# Patient Record
Sex: Female | Born: 1940 | Race: White | Hispanic: No | Marital: Married | Smoking: Never smoker
Health system: Southern US, Community
[De-identification: ages and names within clinical notes are randomized; demographics above are authoritative.]

## PROBLEM LIST (undated history)

## (undated) DIAGNOSIS — M1990 Unspecified osteoarthritis, unspecified site: Secondary | ICD-10-CM

## (undated) DIAGNOSIS — F0390 Unspecified dementia without behavioral disturbance: Secondary | ICD-10-CM

## (undated) DIAGNOSIS — Z9289 Personal history of other medical treatment: Secondary | ICD-10-CM

## (undated) DIAGNOSIS — H409 Unspecified glaucoma: Secondary | ICD-10-CM

## (undated) DIAGNOSIS — I499 Cardiac arrhythmia, unspecified: Secondary | ICD-10-CM

## (undated) DIAGNOSIS — F32A Depression, unspecified: Secondary | ICD-10-CM

## (undated) DIAGNOSIS — I4891 Unspecified atrial fibrillation: Secondary | ICD-10-CM

## (undated) DIAGNOSIS — E785 Hyperlipidemia, unspecified: Secondary | ICD-10-CM

## (undated) DIAGNOSIS — E538 Deficiency of other specified B group vitamins: Secondary | ICD-10-CM

## (undated) DIAGNOSIS — H269 Unspecified cataract: Secondary | ICD-10-CM

## (undated) HISTORY — DX: Personal history of other medical treatment: Z92.89

## (undated) HISTORY — PX: HEMORROIDECTOMY: SUR656

## (undated) HISTORY — DX: Hyperlipidemia, unspecified: E78.5

## (undated) HISTORY — PX: EYE SURGERY: SHX253

## (undated) HISTORY — DX: Depression, unspecified: F32.A

## (undated) HISTORY — PX: APPENDECTOMY: SHX54

## (undated) HISTORY — DX: Unspecified atrial fibrillation: I48.91

## (undated) HISTORY — DX: Unspecified cataract: H26.9

## (undated) HISTORY — PX: TONSILLECTOMY: SUR1361

## (undated) HISTORY — DX: Unspecified dementia, unspecified severity, without behavioral disturbance, psychotic disturbance, mood disturbance, and anxiety: F03.90

## (undated) HISTORY — DX: Unspecified osteoarthritis, unspecified site: M19.90

## (undated) HISTORY — DX: Deficiency of other specified B group vitamins: E53.8

---

## 1995-12-29 LAB — HM COLONOSCOPY

## 1999-02-26 ENCOUNTER — Other Ambulatory Visit: Admission: RE | Admit: 1999-02-26 | Discharge: 1999-02-26 | Payer: Self-pay | Admitting: Obstetrics and Gynecology

## 2000-07-15 ENCOUNTER — Encounter: Payer: Self-pay | Admitting: Obstetrics and Gynecology

## 2000-07-15 ENCOUNTER — Encounter: Admission: RE | Admit: 2000-07-15 | Discharge: 2000-07-15 | Payer: Self-pay | Admitting: Obstetrics and Gynecology

## 2000-08-09 ENCOUNTER — Other Ambulatory Visit: Admission: RE | Admit: 2000-08-09 | Discharge: 2000-08-09 | Payer: Self-pay | Admitting: Obstetrics and Gynecology

## 2001-06-07 ENCOUNTER — Encounter: Payer: Self-pay | Admitting: Ophthalmology

## 2001-06-07 ENCOUNTER — Ambulatory Visit (HOSPITAL_COMMUNITY): Admission: RE | Admit: 2001-06-07 | Discharge: 2001-06-08 | Payer: Self-pay | Admitting: Ophthalmology

## 2001-08-15 ENCOUNTER — Other Ambulatory Visit: Admission: RE | Admit: 2001-08-15 | Discharge: 2001-08-15 | Payer: Self-pay | Admitting: Obstetrics and Gynecology

## 2002-09-05 ENCOUNTER — Other Ambulatory Visit: Admission: RE | Admit: 2002-09-05 | Discharge: 2002-09-05 | Payer: Self-pay | Admitting: Obstetrics and Gynecology

## 2003-10-17 ENCOUNTER — Other Ambulatory Visit: Admission: RE | Admit: 2003-10-17 | Discharge: 2003-10-17 | Payer: Self-pay | Admitting: Obstetrics and Gynecology

## 2003-11-01 ENCOUNTER — Encounter: Admission: RE | Admit: 2003-11-01 | Discharge: 2003-11-01 | Payer: Self-pay | Admitting: Obstetrics and Gynecology

## 2004-12-02 ENCOUNTER — Ambulatory Visit (HOSPITAL_COMMUNITY): Admission: RE | Admit: 2004-12-02 | Discharge: 2004-12-02 | Payer: Self-pay | Admitting: Obstetrics and Gynecology

## 2005-09-14 ENCOUNTER — Observation Stay (HOSPITAL_COMMUNITY): Admission: RE | Admit: 2005-09-14 | Discharge: 2005-09-15 | Payer: Self-pay | Admitting: General Surgery

## 2013-02-14 ENCOUNTER — Emergency Department (INDEPENDENT_AMBULATORY_CARE_PROVIDER_SITE_OTHER)
Admission: EM | Admit: 2013-02-14 | Discharge: 2013-02-14 | Disposition: A | Discharge status: Home / Self Care | Payer: Medicare Other | Source: Home / Self Care

## 2013-02-14 ENCOUNTER — Encounter (HOSPITAL_COMMUNITY): Payer: Self-pay | Admitting: *Deleted

## 2013-02-14 DIAGNOSIS — L03039 Cellulitis of unspecified toe: Secondary | ICD-10-CM

## 2013-02-14 DIAGNOSIS — L03031 Cellulitis of right toe: Secondary | ICD-10-CM

## 2013-02-14 DIAGNOSIS — L03032 Cellulitis of left toe: Secondary | ICD-10-CM

## 2013-02-14 DIAGNOSIS — L0390 Cellulitis, unspecified: Secondary | ICD-10-CM

## 2013-02-14 DIAGNOSIS — L0291 Cutaneous abscess, unspecified: Secondary | ICD-10-CM

## 2013-02-14 HISTORY — DX: Unspecified glaucoma: H40.9

## 2013-02-14 MED ORDER — DOXYCYCLINE HYCLATE 100 MG PO CAPS: 100.0000 mg | ORAL_CAPSULE | Freq: Two times a day (BID) | ORAL | Status: DC

## 2013-02-14 NOTE — ED Notes (Signed)
Pt reports toe infection on 4th toe on right foot and 3rd toe on left foot - toes are discolored, draining, sore to touch - no known injury - reports that she did cut toe nails prior to infection- has tried soaking feet and neosporin with no relief - pt is not diabetic has no ongoing health problems

## 2013-02-14 NOTE — ED Provider Notes (Signed)
Medical screening examination/treatment/procedure(s) were performed by resident physician or non-physician practitioner and as supervising physician I was immediately available for consultation/collaboration.   Barkley Bruns MD.   Linna Hoff, MD 02/14/13 [phone]

## 2013-02-14 NOTE — ED Provider Notes (Signed)
History     CSN: [id number]  Arrival date & time 02/14/13  1109   First MD Initiated Contact with Patient 02/14/13 1117      Chief Complaint  Patient presents with  . Toe Pain    (Consider location/radiation/quality/duration/timing/severity/associated sxs/prior treatment) HPI Comments: 72 year old well-preserved and active female is accompanied by her husband with a chief complaint of redness and tenderness in her toes of both feet. Approximately 2 weeks ago she gave herself a pedicure and used unclean utensils to clean under her cuticles and her nails. She is complaining of soreness in the toes primarily the left third toe in the right second third and fourth toes. She has been soaking them in warm water and apply Neosporin. They would slightly better and then worse again. She denies constitutional symptoms. No fever.   Past Medical History  Diagnosis Date  . Glaucoma     Past Surgical History  Procedure Laterality Date  . Appendectomy      Family History  Problem Relation Age of Onset  . Family history unknown: Yes    History  Substance Use Topics  . Smoking status: Never Smoker   . Smokeless tobacco: Not on file  . Alcohol Use: Yes     Comment: occasional    OB History   Grav Para Term Preterm Abortions TAB SAB Ect Mult Living                  Review of Systems  Constitutional: Negative.   Respiratory: Negative.   Gastrointestinal: Negative.   Skin: Positive for color change.       See history of present illness  Neurological: Negative.     Allergies  Review of patient's allergies indicates no known allergies.  Home Medications  No current outpatient prescriptions on file.  BP 132/82  Pulse 79  Temp(Src) 98.3 F (36.8 C) (Oral)  Resp 20  SpO2 95%  Physical Exam  Constitutional: She is oriented to person, place, and time. She appears well-developed and well-nourished. No distress.  Eyes: Conjunctivae and EOM are normal.  Neck: Normal range of  motion. Neck supple.  Pulmonary/Chest: Effort normal.  Neurological: She is alert and oriented to person, place, and time. She exhibits normal muscle tone.  Skin: Skin is warm and dry. No rash noted. There is erythema.  Minor erythema and swelling of the right second third and fourth toes in the left third toe. No abscess formation or purulence of the paronychia. There is no erythema over a joint space. Distal neurovascular and sensory is intact. She is able to wiggle her toes however there is some decrease in range of motion due to pain.  Psychiatric: She has a normal mood and affect.    ED Course  Procedures (including critical care time)  Labs Reviewed - No data to display No results found.   1. Paronychia of second toe, right   2. Paronychia of third toe, left   3. Cellulitis       MDM  There is non-circumferential swelling and mild erythema to the distal aspect of the right second third and fourth toe and the left third toe. Although there are 3 toes with paronychia none of which have evidence of underlying purulence. Doxycycline 100 mg twice a day for 10 days. So toes in warm salty water 2-3 times a day. If there is no resolution or improvement within 3 days or if getting worse we will need to return.  Hayden Rasmussen, NP 02/14/13 1415

## 2013-03-26 ENCOUNTER — Emergency Department (INDEPENDENT_AMBULATORY_CARE_PROVIDER_SITE_OTHER)
Admission: EM | Admit: 2013-03-26 | Discharge: 2013-03-26 | Disposition: A | Discharge status: Home / Self Care | Payer: Medicare Other | Source: Home / Self Care | Attending: Family Medicine | Admitting: Family Medicine

## 2013-03-26 ENCOUNTER — Emergency Department (INDEPENDENT_AMBULATORY_CARE_PROVIDER_SITE_OTHER): Payer: Medicare Other

## 2013-03-26 ENCOUNTER — Encounter (HOSPITAL_COMMUNITY): Payer: Self-pay | Admitting: *Deleted

## 2013-03-26 DIAGNOSIS — M79674 Pain in right toe(s): Secondary | ICD-10-CM

## 2013-03-26 DIAGNOSIS — M79609 Pain in unspecified limb: Secondary | ICD-10-CM

## 2013-03-26 MED ORDER — DOXYCYCLINE HYCLATE 100 MG PO CAPS
100.0000 mg | ORAL_CAPSULE | Freq: Two times a day (BID) | ORAL | Status: DC
Start: 2013-03-26 — End: 2015-05-20

## 2013-03-26 NOTE — ED Notes (Signed)
Patient compalins of toe pain right toe pain pain started yesterday. Right 4th and 5th toes are painful and turning purple.

## 2013-03-26 NOTE — ED Provider Notes (Signed)
History     CSN: [id number]  Arrival date & time 03/26/13  1109   First MD Initiated Contact with Patient 03/26/13 1111      Chief Complaint  Patient presents with  . Toe Pain    HPI: Patient is a 72 y.o. female presenting with toe pain. The history is provided by the patient.  Toe Pain This is a recurrent problem. The current episode started 12 to 24 hours ago. The problem occurs constantly. The problem has been gradually worsening. The symptoms are aggravated by walking. Nothing relieves the symptoms. She has tried nothing for the symptoms.  Pt reports onset of pain and TTP to her (R) 4th and 5th toes. States she was here 02/14/2013 for same type of infection in other toes and was treated with antibiotic. Symptoms completely resolved. Last time pt states the infection in her toes started the same way and she waited longer and the toes were much worse by the time she was seen. States she wants to get treated earlier before infection gets worse. Denies injury or fever.   Past Medical History  Diagnosis Date  . Glaucoma     Past Surgical History  Procedure Laterality Date  . Appendectomy      No family history on file.  History  Substance Use Topics  . Smoking status: Never Smoker   . Smokeless tobacco: Not on file  . Alcohol Use: Yes     Comment: occasional    OB History   Grav Para Term Preterm Abortions TAB SAB Ect Mult Living                  Review of Systems  All other systems reviewed and are negative.    Allergies  Review of patient's allergies indicates no known allergies.  Home Medications   Current Outpatient Rx  Name  Route  Sig  Dispense  Refill  . doxycycline (VIBRAMYCIN) 100 MG capsule   Oral   Take 1 capsule (100 mg total) by mouth 2 (two) times daily.   20 capsule   0     BP 137/74  Pulse 80  Temp(Src) 98.1 F (36.7 C) (Oral)  Resp 18  SpO2 100%  Physical Exam  Constitutional: She is oriented to person, place, and time. She  appears well-developed and well-nourished.  HENT:  Head: Normocephalic and atraumatic.  Eyes: Conjunctivae are normal.  Neck: Neck supple.  Cardiovascular: Normal rate.   Pulmonary/Chest: Effort normal.  Musculoskeletal: Normal range of motion.       Feet:  (R) 4th and 5th toes noted w/ noncircumfrential swelling and somewhat bluish in appearance. Noted old healed wound to 4th (R) toe from previous infection. No open wound currently. Cap refill diminished but good palpable DP pulses. Both toes very TTP.   Neurological: She is alert and oriented to person, place, and time.  Skin: Skin is warm and dry.  Psychiatric: She has a normal mood and affect.    ED Course  Procedures (including critical care time)  Labs Reviewed - No data to display Dg Foot Complete Right  03/26/2013  *RADIOLOGY REPORT*  Clinical Data: Chronic right fourth toe infection with associated swelling.  RIGHT FOOT COMPLETE - 3+ VIEW  Comparison: None.  Findings: Diffuse soft tissue swelling involving the distal aspect of the fourth toe.  The underlying bones have normal appearances with no periosteal reaction or bone destruction.  There is minimal fragmented degenerative spur formation at the fourth DIP joint.  No  soft tissue gas.  The remainder of the examination is unremarkable.  IMPRESSION: Fourth toe soft tissue swelling without underlying fracture or evidence of osteomyelitis.  There are minimal degenerative changes at the fourth DIP joint.   Original Report Authenticated By: Beckie Salts, M.D.      No diagnosis found.    MDM  Recurrent toe infections. Current pain is to (R) 4th and 5th toes (Seen previously here on 02/14/2013 for same symptoms) Will repeat treatment with Doxycycline as this did resolve symptoms last time. Xrays negative for osteomyelitis. Pt dweclined med for pain. Will provide referrals to [facility] for f/u if this continues to be recurrent. Discussed witgh Dr Artis Flock who has also seen pt and xrays  and is agreeable w/ plan.         Leanne Chang, NP 03/26/13 1239

## 2013-03-30 NOTE — ED Provider Notes (Signed)
Medical screening examination/treatment/procedure(s) were performed by resident physician or non-physician practitioner and as supervising physician I was immediately available for consultation/collaboration.   Barkley Bruns MD.   Linna Hoff, MD 03/30/13 [phone]

## 2014-12-28 LAB — COLOGUARD: Cologuard: NEGATIVE

## 2014-12-28 LAB — HM MAMMOGRAPHY

## 2015-05-01 ENCOUNTER — Encounter (HOSPITAL_COMMUNITY): Payer: Self-pay | Admitting: Emergency Medicine

## 2015-05-01 ENCOUNTER — Emergency Department (HOSPITAL_COMMUNITY)
Admission: EM | Admit: 2015-05-01 | Discharge: 2015-05-01 | Disposition: A | Discharge status: Home / Self Care | Payer: Medicare PPO | Attending: Emergency Medicine | Admitting: Emergency Medicine

## 2015-05-01 DIAGNOSIS — Z79899 Other long term (current) drug therapy: Secondary | ICD-10-CM | POA: Insufficient documentation

## 2015-05-01 DIAGNOSIS — H409 Unspecified glaucoma: Secondary | ICD-10-CM | POA: Diagnosis not present

## 2015-05-01 DIAGNOSIS — I4891 Unspecified atrial fibrillation: Secondary | ICD-10-CM

## 2015-05-01 DIAGNOSIS — Z7982 Long term (current) use of aspirin: Secondary | ICD-10-CM | POA: Insufficient documentation

## 2015-05-01 DIAGNOSIS — R531 Weakness: Secondary | ICD-10-CM | POA: Diagnosis present

## 2015-05-01 LAB — BASIC METABOLIC PANEL
Anion gap: 8 (ref 5–15)
BUN: 17 mg/dL (ref 6–20)
CO2: 27 mmol/L (ref 22–32)
Calcium: 9.3 mg/dL (ref 8.9–10.3)
Chloride: 105 mmol/L (ref 101–111)
Creatinine, Ser: 0.82 mg/dL (ref 0.44–1.00)
GFR calc Af Amer: >60 mL/min (ref >60)
GFR calc non Af Amer: >60 mL/min (ref >60)
Glucose, Bld: 106 mg/dL — ABNORMAL HIGH (ref 70–99)
Potassium: 4.1 mmol/L (ref 3.5–5.1)
Sodium: 140 mmol/L (ref 135–145)

## 2015-05-01 LAB — CBC
HCT: 45.7 % (ref 36.0–46.0)
Hemoglobin: 15 g/dL (ref 12.0–15.0)
MCH: 30.5 pg (ref 26.0–34.0)
MCHC: 32.8 g/dL (ref 30.0–36.0)
MCV: 93.1 fL (ref 78.0–100.0)
Platelets: 186 10*3/uL (ref 150–400)
RBC: 4.91 MIL/uL (ref 3.87–5.11)
RDW: 13.2 % (ref 11.5–15.5)
WBC: 4.6 10*3/uL (ref 4.0–10.5)

## 2015-05-01 LAB — MAGNESIUM: Magnesium: 2.4 mg/dL (ref 1.7–2.4)

## 2015-05-01 LAB — URINALYSIS, ROUTINE W REFLEX MICROSCOPIC
Bilirubin Urine: NEGATIVE
Glucose, UA: NEGATIVE mg/dL
Ketones, ur: NEGATIVE mg/dL
Nitrite: NEGATIVE
Protein, ur: NEGATIVE mg/dL
Specific Gravity, Urine: 1.015 (ref 1.005–1.030)
Urobilinogen, UA: 0.2 mg/dL (ref 0.0–1.0)
pH: 5.5 (ref 5.0–8.0)

## 2015-05-01 LAB — URINE MICROSCOPIC-ADD ON

## 2015-05-01 LAB — TROPONIN I: Troponin I: <0.03 ng/mL (ref <0.031)

## 2015-05-01 LAB — TSH: TSH: 2.915 u[IU]/mL (ref 0.350–4.500)

## 2015-05-01 MED ORDER — DILTIAZEM HCL ER COATED BEADS 120 MG PO CP24
ORAL_CAPSULE | ORAL | Status: AC
  Filled 2015-05-01: qty 1

## 2015-05-01 MED ORDER — APIXABAN 5 MG PO TABS: 5.0000 mg | ORAL_TABLET | Freq: Two times a day (BID) | ORAL | Status: DC

## 2015-05-01 MED ORDER — APIXABAN 5 MG PO TABS
ORAL_TABLET | ORAL | Status: AC
  Filled 2015-05-01: qty 1

## 2015-05-01 MED ORDER — DILTIAZEM LOAD VIA INFUSION
10.0000 mg | Freq: Once | INTRAVENOUS | Status: AC
  Administered 2015-05-01: 10 mg via INTRAVENOUS
  Filled 2015-05-01: qty 10

## 2015-05-01 MED ORDER — DILTIAZEM HCL ER COATED BEADS 120 MG PO CP24
120.0000 mg | ORAL_CAPSULE | Freq: Every day | ORAL | Status: DC
  Administered 2015-05-01: 120 mg via ORAL
  Filled 2015-05-01 (×3): qty 1

## 2015-05-01 MED ORDER — ASPIRIN 81 MG PO CHEW
324.0000 mg | CHEWABLE_TABLET | Freq: Once | ORAL | Status: AC
  Administered 2015-05-01: 324 mg via ORAL
  Filled 2015-05-01: qty 4

## 2015-05-01 MED ORDER — DILTIAZEM HCL 100 MG IV SOLR
5.0000 mg/h | INTRAVENOUS | Status: DC
  Administered 2015-05-01: 5 mg/h via INTRAVENOUS
  Filled 2015-05-01: qty 100

## 2015-05-01 MED ORDER — APIXABAN 5 MG PO TABS
5.0000 mg | ORAL_TABLET | Freq: Two times a day (BID) | ORAL | Status: DC
  Administered 2015-05-01: 5 mg via ORAL
  Filled 2015-05-01 (×4): qty 1

## 2015-05-01 MED ORDER — DILTIAZEM HCL ER COATED BEADS 120 MG PO CP24: 120.0000 mg | ORAL_CAPSULE | Freq: Every day | ORAL | Status: DC

## 2015-05-01 NOTE — ED Notes (Signed)
Patient with no complaints at this time. Respirations even and unlabored. Skin warm/dry. Discharge instructions reviewed with patient at this time. Patient given opportunity to voice concerns/ask questions. IV removed per policy and band-aid applied to site. Patient discharged at this time and left Emergency Department with steady gait.  

## 2015-05-01 NOTE — ED Notes (Signed)
Reviewed d/c instructions w/patient and husband.  She voices understanding of these.

## 2015-05-01 NOTE — Discharge Instructions (Signed)

## 2015-05-01 NOTE — ED Notes (Signed)
Pt reports fell and hit her head several months ago. Pt reports for the last several weeks has become progressively weaker,unsteady walking,sob. nad noted.

## 2015-05-01 NOTE — ED Provider Notes (Signed)
CSN: [id number]     Arrival date & time 05/01/15  1534 History   First MD Initiated Contact with Patient 05/01/15 1742     Chief Complaint  Patient presents with  . Weakness     (Consider location/radiation/quality/duration/timing/severity/associated sxs/prior Treatment) HPI   74yF with generalized weakness and palpitations. Noted to be in new onset afib. Symptom onset a couple weeks ago. Persistent. No appreciable exacerbating or relieving factors. Denies CP. No SOB. No fever or chills. No n/v. No urinary complaints. Denies hx of thyroid disease or CAD. Denies significant ETOH.   Past Medical History  Diagnosis Date  . Glaucoma    Past Surgical History  Procedure Laterality Date  . Appendectomy     History reviewed. No pertinent family history. History  Substance Use Topics  . Smoking status: Never Smoker   . Smokeless tobacco: Not on file  . Alcohol Use: Yes     Comment: occasional   OB History    No data available     Review of Systems  All systems reviewed and negative, other than as noted in HPI.   Allergies  Review of patient's allergies indicates no known allergies.  Home Medications   Prior to Admission medications   Medication Sig Start Date End Date Taking? Authorizing Provider  aspirin EC 81 MG tablet Take 81 mg by mouth daily.   Yes Historical Provider, MD  latanoprost (XALATAN) 0.005 % ophthalmic solution Place 1 drop into the left eye at bedtime. 02/26/15  Yes Historical Provider, MD  timolol (TIMOPTIC) 0.5 % ophthalmic solution Place 1 drop into the left eye 2 (two) times daily.   Yes Historical Provider, MD  doxycycline (VIBRAMYCIN) 100 MG capsule Take 1 capsule (100 mg total) by mouth 2 (two) times daily. Patient not taking: Reported on 05/01/2015 02/14/13   Janne Napoleon, NP  doxycycline (VIBRAMYCIN) 100 MG capsule Take 1 capsule (100 mg total) by mouth 2 (two) times daily. Patient not taking: Reported on 05/01/2015 03/26/13   Rhetta Mura Schorr, NP   BP  121/85 mmHg  Pulse 98  Temp(Src) 97.9 F (36.6 C) (Oral)  Resp 18  Ht 5\' 6"  (1.676 m)  Wt 115 lb (52.164 kg)  BMI 18.57 kg/m2  SpO2 97% Physical Exam  Constitutional: She appears well-developed and well-nourished. No distress.  HENT:  Head: Normocephalic and atraumatic.  Eyes: Conjunctivae are normal. Right eye exhibits no discharge. Left eye exhibits no discharge.  Neck: Neck supple.  Cardiovascular: Normal rate and normal heart sounds.  Exam reveals no gallop and no friction rub.   No murmur heard. irreg irreg  Pulmonary/Chest: Effort normal and breath sounds normal. No respiratory distress.  Abdominal: Soft. She exhibits no distension. There is no tenderness.  Musculoskeletal: She exhibits no edema or tenderness.  Lower extremities symmetric as compared to each other. No calf tenderness. Negative Homan's. No palpable cords.   Neurological: She is alert.  Skin: Skin is warm and dry.  Psychiatric: She has a normal mood and affect. Her behavior is normal. Thought content normal.  Nursing note and vitals reviewed.   ED Course  Procedures (including critical care time) Labs Review Labs Reviewed  BASIC METABOLIC PANEL - Abnormal; Notable for the following:    Glucose, Bld 106 (*)    All other components within normal limits  CBC    Imaging Review No results found.   EKG Interpretation   Date/Time:  Wednesday May 01 2015 18:57:21 EDT Ventricular Rate:  146 PR Interval:  QRS Duration: 85 QT Interval:  285 QTC Calculation: 444 R Axis:   76 Text Interpretation:  Atrial fibrillation with rapid V-rate Repolarization  abnormality, prob rate related ED PHYSICIAN INTERPRETATION AVAILABLE IN  [facility] Confirmed by TEST, Record ([id number]) on 05/03/2015 7:21:01 AM      MDM   Final diagnoses:  New onset a-fib    74yF with new onset atrial fibrillation. Suspect the etiology of her symptoms. CHA2DS2-VASc score is 3 with female sex and age of 37. Rate now controlled.  Will provide prescription for cardizem and touch base with cardiology with regards to recommendations for anticoagulant.       Virgel Manifold, MD 05/08/15 [phone]

## 2015-05-20 ENCOUNTER — Encounter: Payer: Self-pay | Admitting: Cardiovascular Disease

## 2015-05-20 ENCOUNTER — Ambulatory Visit (INDEPENDENT_AMBULATORY_CARE_PROVIDER_SITE_OTHER): Payer: Medicare PPO | Admitting: Cardiovascular Disease

## 2015-05-20 VITALS — BP 120/76 | HR 74 | Ht 65.0 in | Wt 113.0 lb

## 2015-05-20 DIAGNOSIS — Z87898 Personal history of other specified conditions: Secondary | ICD-10-CM | POA: Diagnosis not present

## 2015-05-20 DIAGNOSIS — Z7189 Other specified counseling: Secondary | ICD-10-CM

## 2015-05-20 DIAGNOSIS — R002 Palpitations: Secondary | ICD-10-CM

## 2015-05-20 DIAGNOSIS — I4891 Unspecified atrial fibrillation: Secondary | ICD-10-CM | POA: Diagnosis not present

## 2015-05-20 DIAGNOSIS — R5383 Other fatigue: Secondary | ICD-10-CM

## 2015-05-20 DIAGNOSIS — Z9289 Personal history of other medical treatment: Secondary | ICD-10-CM

## 2015-05-20 MED ORDER — DILTIAZEM HCL ER COATED BEADS 240 MG PO CP24: 240.0000 mg | ORAL_CAPSULE | Freq: Every day | ORAL | Status: DC

## 2015-05-20 NOTE — Patient Instructions (Addendum)
Your physician recommends that you schedule a follow-up appointment in:  2 weeks   Your physician has recommended you make the following change in your medication:   INCREASE Cardizem to 240 mg daily   Your physician has requested that you have an echocardiogram. Echocardiography is a painless test that uses sound waves to create images of your heart. It provides your doctor with information about the size and shape of your heart and how well your heart's chambers and valves are working. This procedure takes approximately one hour. There are no restrictions for this procedure.     Thank you for choosing [facility] !

## 2015-05-20 NOTE — Progress Notes (Signed)
Patient ID: Kelly Dougherty, female   DOB: [birth year], 74 y.o.   MRN: [id number]       CARDIOLOGY CONSULT NOTE  Patient ID: Kelly Dougherty MRN: [id number] DOB/AGE: 10/13/1941 74 y.o.  Admit date: (Not on file) Primary Physician Kelly Seashore, MD  Reason for Consultation: New onset atrial fibrillation  HPI: The patient is a 74 year old woman was recently evaluated in the emergency department on 05/01/15 for weakness and palpitations, and was found to be in new onset rapid atrial fibrillation. She was prescribed long-acting diltiazem for rate control and apixaban for anticoagulation. I reviewed all relevant labs and studies. Magnesium, TSH, troponin, CBC, and basic metabolic panel were all unremarkable. ECG demonstrated rapid atrial fibrillation and flutter, heart rate 146 bpm.  She is here with her husband. She tells me that prior to her presentation to the ED, she had been experiencing one week of palpitations accompanied by fatigue and shortness of breath. She also had lightheadedness and dizziness but denied syncope. Other complaints relate to a lack of sensation in the bottom part of the middle toe of both feet. She denies significant leg swelling. She has felt better on the diltiazem but continues to have some fatigue and shortness of breath.   No Known Allergies  Current Outpatient Prescriptions  Medication Sig Dispense Refill  . apixaban (ELIQUIS) 5 MG TABS tablet Take 1 tablet (5 mg total) by mouth 2 (two) times daily. 60 tablet 0  . diltiazem (CARDIZEM CD) 120 MG 24 hr capsule Take 1 capsule (120 mg total) by mouth daily. 30 capsule 0  . latanoprost (XALATAN) 0.005 % ophthalmic solution Place 1 drop into the left eye at bedtime.    . timolol (TIMOPTIC) 0.5 % ophthalmic solution Place 1 drop into the left eye 2 (two) times daily.     No current facility-administered medications for this visit.    Past Medical History  Diagnosis Date  . Glaucoma     Past Surgical History   Procedure Laterality Date  . Appendectomy      History   Social History  . Marital Status: Married    Spouse Name: N/A  . Number of Children: N/A  . Years of Education: N/A   Occupational History  . Not on file.   Social History Main Topics  . Smoking status: Never Smoker   . Smokeless tobacco: Not on file  . Alcohol Use: Yes     Comment: occasional  . Drug Use: No  . Sexual Activity: Not on file   Other Topics Concern  . Not on file   Social History Narrative     No family history of premature CAD in 1st degree relatives.  Prior to Admission medications   Medication Sig Start Date End Date Taking? Authorizing Provider  apixaban (ELIQUIS) 5 MG TABS tablet Take 1 tablet (5 mg total) by mouth 2 (two) times daily. 05/01/15  Yes Kelly Manifold, MD  diltiazem (CARDIZEM CD) 120 MG 24 hr capsule Take 1 capsule (120 mg total) by mouth daily. 05/01/15  Yes Kelly Manifold, MD  latanoprost (XALATAN) 0.005 % ophthalmic solution Place 1 drop into the left eye at bedtime. 02/26/15  Yes Historical Provider, MD  timolol (TIMOPTIC) 0.5 % ophthalmic solution Place 1 drop into the left eye 2 (two) times daily.   Yes Historical Provider, MD     Review of systems complete and found to be negative unless listed above in HPI     Physical exam Blood pressure 120/76, pulse 74,  height 5\' 5"  (1.651 m), weight 113 lb (51.256 kg), SpO2 98 %.   HR by auscultation: 96 bpm  General: NAD Neck: No JVD, no thyromegaly or thyroid nodule.  Lungs: Clear to auscultation bilaterally with normal respiratory effort. CV: Nondisplaced PMI. Heart rate at upper normal limits, irregular rhythm, normal S1/S2, no S3, no murmur.  No peripheral edema.  No carotid bruit.  Normal pedal pulses.  Abdomen: Soft, nontender, no hepatosplenomegaly, no distention.  Skin: Intact without lesions or rashes.  Neurologic: Alert and oriented x 3.  Psych: Normal affect. Extremities: No clubbing or cyanosis. Feet are cold. HEENT:  Normal.   ECG: Most recent ECG reviewed.  Labs:   Lab Results  Component Value Date   WBC 4.6 05/01/2015   HGB 15.0 05/01/2015   HCT 45.7 05/01/2015   MCV 93.1 05/01/2015   PLT 186 05/01/2015   No results for input(s): NA, K, CL, CO2, BUN, CREATININE, CALCIUM, PROT, BILITOT, ALKPHOS, ALT, AST, GLUCOSE in the last 168 hours.  Invalid input(s): LABALBU Lab Results  Component Value Date   TROPONINI <0.03 05/01/2015   No results found for: CHOL No results found for: HDL No results found for: LDLCALC No results found for: TRIG No results found for: CHOLHDL No results found for: LDLDIRECT       Studies: No results found.  ASSESSMENT AND PLAN:  1. New onset atrial fibrillation: Symptomatically improved but continues to have weakness and shortness of breath with suboptimal HR control. Will increase long-acting diltiazem to 240 mg daily. Obtain echocardiogram to assess LV function and LA volume index.  If heart rate becomes controlled but she continues to experience activity limiting symptoms, I will proceed with direct-current cardioversion. Continue apixaban for thromboembolic risk reduction given CHADSVASC score of 3 (age, gender).  Dispo: f/u 2 weeks.   Signed: Kate Sable, M.D., F.A.C.C.  05/20/2015, 8:26 AM

## 2015-05-21 ENCOUNTER — Ambulatory Visit (HOSPITAL_COMMUNITY)
Admission: RE | Admit: 2015-05-21 | Discharge: 2015-05-21 | Disposition: A | Discharge status: Home / Self Care | Payer: Medicare PPO | Source: Ambulatory Visit | Attending: Cardiovascular Disease | Admitting: Cardiovascular Disease

## 2015-05-21 DIAGNOSIS — I083 Combined rheumatic disorders of mitral, aortic and tricuspid valves: Secondary | ICD-10-CM | POA: Insufficient documentation

## 2015-05-21 DIAGNOSIS — I4891 Unspecified atrial fibrillation: Secondary | ICD-10-CM | POA: Diagnosis present

## 2015-05-28 ENCOUNTER — Telehealth: Payer: Self-pay | Admitting: Cardiovascular Disease

## 2015-05-28 ENCOUNTER — Other Ambulatory Visit: Payer: Self-pay

## 2015-05-28 MED ORDER — APIXABAN 5 MG PO TABS: 5.0000 mg | ORAL_TABLET | Freq: Two times a day (BID) | ORAL | Status: DC

## 2015-05-28 NOTE — Telephone Encounter (Signed)
Needs refill on Eliquis 5 mg sent to [facility] on [address] / tg

## 2015-05-28 NOTE — Telephone Encounter (Signed)
Refill complete 

## 2015-05-29 ENCOUNTER — Other Ambulatory Visit: Payer: Self-pay

## 2015-05-29 MED ORDER — APIXABAN 5 MG PO TABS: 5.0000 mg | ORAL_TABLET | Freq: Two times a day (BID) | ORAL | Status: DC

## 2015-05-29 NOTE — Telephone Encounter (Signed)
Refill complete 

## 2015-06-06 ENCOUNTER — Ambulatory Visit (INDEPENDENT_AMBULATORY_CARE_PROVIDER_SITE_OTHER): Payer: Medicare PPO | Admitting: Cardiovascular Disease

## 2015-06-06 ENCOUNTER — Other Ambulatory Visit: Payer: Self-pay

## 2015-06-06 ENCOUNTER — Encounter: Payer: Self-pay | Admitting: Cardiovascular Disease

## 2015-06-06 VITALS — BP 122/68 | HR 95 | Ht 65.0 in | Wt 109.0 lb

## 2015-06-06 DIAGNOSIS — Z7189 Other specified counseling: Secondary | ICD-10-CM | POA: Diagnosis not present

## 2015-06-06 DIAGNOSIS — M79671 Pain in right foot: Secondary | ICD-10-CM

## 2015-06-06 DIAGNOSIS — I4891 Unspecified atrial fibrillation: Secondary | ICD-10-CM | POA: Diagnosis not present

## 2015-06-06 DIAGNOSIS — R5383 Other fatigue: Secondary | ICD-10-CM | POA: Diagnosis not present

## 2015-06-06 DIAGNOSIS — R002 Palpitations: Secondary | ICD-10-CM | POA: Diagnosis not present

## 2015-06-06 DIAGNOSIS — I429 Cardiomyopathy, unspecified: Secondary | ICD-10-CM

## 2015-06-06 MED ORDER — DILTIAZEM HCL ER COATED BEADS 120 MG PO CP24: ORAL_CAPSULE | ORAL | Status: DC

## 2015-06-06 NOTE — Progress Notes (Signed)
Patient ID: Kelly Dougherty, female   DOB: Nov 14, 1941, 74 y.o.   MRN: [id number]      SUBJECTIVE: The patient presents for follow-up of new onset atrial fibrillation. Echocardiogram on 05/21/15 demonstrated mildly reduced left ventricular systolic function, LVEF 29-52%, diffuse hypokinesis, mild mitral and aortic regurgitation, moderate left atrial enlargement, mildly reduced right ventricular systolic function, and moderate to severe right atrial dilatation with moderate tricuspid regurgitation.  She says "I feel so much better". She has palpitations and some exertional shortness of breath and fatigue but this has markedly improved since her last visit with an increase of diltiazem to 240 mg daily. She denies bleeding problems. She has occasional right ankle swelling and pain at the base of the toes of her right foot after she has been standing on her feet all day.  She has eliminated caffeinated coffee, chocolate, and wine as per ED physician recommendation, and asks whether she can eat a chocolate ice cream sandwich once in a while.   Soc: Married in 1959. Husband is a retired Marine scientist and studied at [employer]. 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 3 living children, 1 son committed suicide.   Review of Systems: As per "subjective", otherwise negative.  No Known Allergies  Current Outpatient Prescriptions  Medication Sig Dispense Refill  . apixaban (ELIQUIS) 5 MG TABS tablet Take 1 tablet (5 mg total) by mouth 2 (two) times daily. 60 tablet 6  . diltiazem (CARDIZEM CD) 240 MG 24 hr capsule Take 1 capsule (240 mg total) by mouth daily. 90 capsule 3  . latanoprost (XALATAN) 0.005 % ophthalmic solution Place 1 drop into the left eye at bedtime.    . timolol (TIMOPTIC) 0.5 % ophthalmic solution Place 1 drop into the left eye 2 (two) times daily.     No current facility-administered medications for this visit.    Past Medical History  Diagnosis Date  .  Glaucoma     Past Surgical History  Procedure Laterality Date  . Appendectomy      History   Social History  . Marital Status: Married    Spouse Name: N/A  . Number of Children: N/A  . Years of Education: N/A   Occupational History  . Not on file.   Social History Main Topics  . Smoking status: Never Smoker   . Smokeless tobacco: Not on file  . Alcohol Use: 0.0 oz/week    0 Standard drinks or equivalent per week     Comment: occasional  . Drug Use: No  . Sexual Activity: Not on file   Other Topics Concern  . Not on file   Social History Narrative     Filed Vitals:   06/06/15 0927  BP: 122/68  Pulse: 95  Height: 5\' 5"  (1.651 m)  Weight: 109 lb (49.442 kg)  SpO2: 99%   HR 86 bpm by auscultation  PHYSICAL EXAM General: NAD Neck: No JVD, no thyromegaly or thyroid nodule.  Lungs: Clear to auscultation bilaterally with normal respiratory effort. CV: Nondisplaced PMI. Heart rate upper normal, irregular rhythm, normal S1/S2, no S3, no murmur. No peripheral edema. No carotid bruit. Normal pedal pulses.  Abdomen: Soft, nontender, no hepatosplenomegaly, no distention.  Skin: Intact without lesions or rashes.  Neurologic: Alert and oriented x 3.  Psych: Normal affect. Extremities: No clubbing or cyanosis. Feet are cold. HEENT: Normal.   ECG: Most recent ECG reviewed.      ASSESSMENT AND PLAN: 1. New onset  atrial fibrillation: Symptomatically improved with improved but suboptimal resting HR control. Increase long-acting diltiazem to 240 mg q am and 120 mg a pm.  Continue apixaban for thromboembolic risk reduction given CHADSVASC score of 3 (age, gender). I informed her it would be ok to drink an occasional glass of wine, eat chocolate, and coffee, but just not to do so in excess.  2. Cardiomyopathy: Probably tachycardia-mediated. Will reassess in several months after adequate HR control.  3. Valvular heart disease: Stable. Will monitor. No significant  pedal edema.  4. Right foot pain: Encouraged use of localized orthotic to take pressure off of joints at base of toes.  Dispo: f/u 6 weeks.   Kate Sable, M.D., F.A.C.C.

## 2015-06-06 NOTE — Patient Instructions (Signed)
Your physician recommends that you schedule a follow-up appointment in:6 weeks with Dr Bronson Ing    INCREASE Diltiazem to 240 mg in the am, and 120 mg in the pm     Thank you for choosing [facility] !

## 2015-06-07 ENCOUNTER — Telehealth: Payer: Self-pay | Admitting: Cardiovascular Disease

## 2015-06-07 NOTE — Telephone Encounter (Signed)
pls call concerning RX & the pts ins

## 2015-06-07 NOTE — Telephone Encounter (Signed)
Pt now knows we are waiting for PA on Cardizem

## 2015-08-06 ENCOUNTER — Ambulatory Visit (INDEPENDENT_AMBULATORY_CARE_PROVIDER_SITE_OTHER): Payer: Medicare PPO | Admitting: Cardiovascular Disease

## 2015-08-06 ENCOUNTER — Encounter: Payer: Self-pay | Admitting: Cardiovascular Disease

## 2015-08-06 VITALS — BP 118/70 | HR 66 | Ht 65.0 in | Wt 112.6 lb

## 2015-08-06 DIAGNOSIS — I38 Endocarditis, valve unspecified: Secondary | ICD-10-CM | POA: Diagnosis not present

## 2015-08-06 DIAGNOSIS — R600 Localized edema: Secondary | ICD-10-CM | POA: Diagnosis not present

## 2015-08-06 DIAGNOSIS — I429 Cardiomyopathy, unspecified: Secondary | ICD-10-CM | POA: Insufficient documentation

## 2015-08-06 DIAGNOSIS — I4891 Unspecified atrial fibrillation: Secondary | ICD-10-CM

## 2015-08-06 MED ORDER — FUROSEMIDE 20 MG PO TABS: ORAL_TABLET | ORAL | Status: DC

## 2015-08-06 MED ORDER — POTASSIUM CHLORIDE ER 10 MEQ PO TBCR: EXTENDED_RELEASE_TABLET | ORAL | Status: DC

## 2015-08-06 NOTE — Progress Notes (Signed)
Patient ID: Kelly Dougherty, female   DOB: 1941-11-05, 74 y.o.   MRN: [id number]      SUBJECTIVE: The patient presents for follow-up of atrial fibrillation. Echocardiogram on 05/21/15 demonstrated mildly reduced left ventricular systolic function, LVEF 18-84%, diffuse hypokinesis, mild mitral and aortic regurgitation, moderate left atrial enlargement, mildly reduced right ventricular systolic function, and moderate to severe right atrial dilatation with moderate tricuspid regurgitation.  For the past several weeks, she has developed swelling of her legs, ankles, and feet. She tries to stay active outdoors and has some exertional dyspnea after being active for several hours. She denies chest pain and bleeding problems. She also denies dizziness.   Soc: Married in 1959. Husband is a retired Marine scientist and studied at [employer]. 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 3 living children (one son is international Biomedical engineer for [employer] in [place]), 1 son committed suicide.   Review of Systems: As per "subjective", otherwise negative.  No Known Allergies  Current Outpatient Prescriptions  Medication Sig Dispense Refill  . apixaban (ELIQUIS) 5 MG TABS tablet Take 1 tablet (5 mg total) by mouth 2 (two) times daily. 60 tablet 6  . diltiazem (CARDIZEM CD) 120 MG 24 hr capsule Take 240 mg (2 tabs) in the am, and 120 mg (1 tab) in the pm 90 capsule 6  . latanoprost (XALATAN) 0.005 % ophthalmic solution Place 1 drop into both eyes at bedtime.     . timolol (TIMOPTIC) 0.5 % ophthalmic solution Place 1 drop into the left eye 2 (two) times daily.     No current facility-administered medications for this visit.    Past Medical History  Diagnosis Date  . Glaucoma     Past Surgical History  Procedure Laterality Date  . Appendectomy      History   Social History  . Marital Status: Married    Spouse Name: N/A  . Number of Children: N/A  . Years of  Education: N/A   Occupational History  . Not on file.   Social History Main Topics  . Smoking status: Never Smoker   . Smokeless tobacco: Not on file  . Alcohol Use: 0.0 oz/week    0 Standard drinks or equivalent per week     Comment: occasional  . Drug Use: No  . Sexual Activity: Not on file   Other Topics Concern  . Not on file   Social History Narrative     Filed Vitals:   08/06/15 0914  BP: 118/70  Pulse: 66  Height: 5\' 5"  (1.651 m)  Weight: 112 lb 9.6 oz (51.075 kg)  SpO2: 92%    PHYSICAL EXAM General: NAD Neck: No JVD, no thyromegaly or thyroid nodule.  Lungs: Clear to auscultation bilaterally with normal respiratory effort. CV: Nondisplaced PMI. Heart rate normal, irregular rhythm, normal S1/S2, no S3, no murmur. 1+ pitting pretibial, periankle, and dorsal pedal edema.Normal pedal pulses.  Abdomen: Soft, nontender, no distention.  Skin: Intact without lesions or rashes.  Neurologic: Alert and oriented x 3.  Psych: Normal affect. Extremities: No clubbing or cyanosis.  HEENT: Normal.   ECG: Most recent ECG reviewed.      ASSESSMENT AND PLAN: 1. Atrial fibrillation: Symptomatically improved with regards to palpitations and exertional dyspnea. I previously increased long-acting diltiazem to 240 mg q am and 120 mg a pm, which may be responsible for her leg swelling but she also has a cardiomyopathy. Will continue diltiazem for now but  would consider a switch to beta blockers in the future. Would also consider cardioversion if exertional dyspnea worsens. Continue apixaban for thromboembolic risk reduction given CHADSVASC score of 3 (age, gender).  2. Cardiomyopathy, LVEF 45-50%: Probably tachycardia-mediated. Will reassess in several months after adequate HR control.  3. Valvular heart disease: Will monitor with periodic echocardiograms.  4. Bilateral leg/ankle/feet swelling: Will prescribe Lasix 40 mg daily x 3 days with 20 meq daily of supplemental  KCl. Will check BMET on 8/12. If leg swelling recurs after 3-4 days of stopping Lasix, will have her use Lasix 20 mg prn and 10 meq KCl on those days. I will reassess her in a few weeks to see if she requires a daily diuretic.  I previously increased long-acting diltiazem to 240 mg q am and 120 mg a pm, which may be responsible for her leg swelling but she also has a cardiomyopathy. Will continue diltiazem for now but would consider a switch to beta blockers in the future.    Dispo: f/u 3 weeks.   Kate Sable, M.D., F.A.C.C.

## 2015-08-06 NOTE — Patient Instructions (Addendum)
Your physician recommends that you schedule a follow-up appointment in: 3 weeks with Dr Bronson Ing   TODAY take Lasix 40 mg daily for the next 3 days, and then take 20 mg daily ONLY AS NEEDED for leg swelling  TODAY tale Potassium 20 meq for the next 3 days, and then 10 meq daily when you use the Lasix as needed   Lab work : BMET on Friday 08/09/15   Thank you for choosing Kelly Dougherty !

## 2015-08-10 LAB — BASIC METABOLIC PANEL
BUN: 12 mg/dL (ref 7–25)
CALCIUM: 9 mg/dL (ref 8.6–10.4)
CO2: 27 mmol/L (ref 20–31)
Chloride: 98 mmol/L (ref 98–110)
Creat: 0.77 mg/dL (ref 0.60–0.93)
GLUCOSE: 128 mg/dL — AB (ref 65–99)
POTASSIUM: 4 mmol/L (ref 3.5–5.3)
Sodium: 139 mmol/L (ref 135–146)

## 2015-08-27 ENCOUNTER — Encounter: Payer: Self-pay | Admitting: Cardiovascular Disease

## 2015-08-27 ENCOUNTER — Ambulatory Visit: Payer: Medicare PPO | Admitting: Cardiovascular Disease

## 2015-08-27 ENCOUNTER — Ambulatory Visit (INDEPENDENT_AMBULATORY_CARE_PROVIDER_SITE_OTHER): Payer: Medicare PPO | Admitting: Cardiovascular Disease

## 2015-08-27 VITALS — BP 125/57 | HR 78 | Ht 65.0 in | Wt 113.0 lb

## 2015-08-27 DIAGNOSIS — Z136 Encounter for screening for cardiovascular disorders: Secondary | ICD-10-CM

## 2015-08-27 DIAGNOSIS — R600 Localized edema: Secondary | ICD-10-CM

## 2015-08-27 DIAGNOSIS — I4891 Unspecified atrial fibrillation: Secondary | ICD-10-CM

## 2015-08-27 DIAGNOSIS — I429 Cardiomyopathy, unspecified: Secondary | ICD-10-CM

## 2015-08-27 MED ORDER — METOPROLOL SUCCINATE ER 50 MG PO TB24: 50.0000 mg | ORAL_TABLET | Freq: Two times a day (BID) | ORAL | Status: DC

## 2015-08-27 NOTE — Progress Notes (Signed)
Patient ID: Kelly Dougherty, female   DOB: Jun 27, 1941, 74 y.o.   MRN: [id number]      SUBJECTIVE: The patient presents for follow-up of atrial fibrillation. Echocardiogram on 05/21/15 demonstrated mildly reduced left ventricular systolic function, LVEF 97-02%, diffuse hypokinesis, mild mitral and aortic regurgitation, moderate left atrial enlargement, mildly reduced right ventricular systolic function, and moderate to severe right atrial dilatation with moderate tricuspid regurgitation.  At her last visit, she had been complaining of exertional dyspnea and leg, feet, and ankle swelling. Wt 113 lbs (112 lbs on 8/9).  BUN 12, SCr 0.77 on 8/12.  She is feeling much better today and denies exertional dyspnea. She has had to use Lasix on a near daily basis this past week for ankle and feet swelling.  ECG shows atrial fibrillation/flutter, HR 65 bpm.   Soc: Married in 1959. Husband is a retired Marine scientist and studied at [employer]. 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 3 living children (one son is international Biomedical engineer for [employer] in [place]), 1 son committed suicide.    Review of Systems: As per "subjective", otherwise negative.  No Known Allergies  Current Outpatient Prescriptions  Medication Sig Dispense Refill  . apixaban (ELIQUIS) 5 MG TABS tablet Take 1 tablet (5 mg total) by mouth 2 (two) times daily. 60 tablet 6  . diltiazem (CARDIZEM CD) 120 MG 24 hr capsule Take 240 mg (2 tabs) in the am, and 120 mg (1 tab) in the pm 90 capsule 6  . furosemide (LASIX) 20 MG tablet Take 40 mg for 3 days starting today 08/06/15, and the take 20 mg daily prn as needed for swelling 90 tablet 3  . KLOR-CON M10 10 MEQ tablet     . latanoprost (XALATAN) 0.005 % ophthalmic solution Place 1 drop into both eyes at bedtime.     . potassium chloride (K-DUR) 10 MEQ tablet Take 20 meq (2 tabs) starting today 08/06/15 for the next 3 days and then take 10 meq only on days  you taking Lasix for swelling 90 tablet 3  . timolol (TIMOPTIC) 0.5 % ophthalmic solution Place 1 drop into the left eye 2 (two) times daily.     No current facility-administered medications for this visit.    Past Medical History  Diagnosis Date  . Glaucoma     Past Surgical History  Procedure Laterality Date  . Appendectomy      Social History   Social History  . Marital Status: Married    Spouse Name: N/A  . Number of Children: N/A  . Years of Education: N/A   Occupational History  . Not on file.   Social History Main Topics  . Smoking status: Never Smoker   . Smokeless tobacco: Not on file  . Alcohol Use: 0.0 oz/week    0 Standard drinks or equivalent per week     Comment: occasional  . Drug Use: No  . Sexual Activity: Not on file   Other Topics Concern  . Not on file   Social History Narrative     Filed Vitals:   08/27/15 0910  BP: 125/57  Pulse: 78  Height: 5\' 5"  (1.651 m)  Weight: 113 lb (51.256 kg)  SpO2: 98%    PHYSICAL EXAM General: NAD Neck: No JVD, no thyromegaly or thyroid nodule.  Lungs: Clear to auscultation bilaterally with normal respiratory effort. CV: Nondisplaced PMI. Heart rate normal, irregular rhythm, normal S1/S2, no S3, no murmur. 1+  pitting periankle and dorsal pedal edema.Normal pedal pulses.  Abdomen: Soft, nontender, no distention.  Skin: Intact without lesions or rashes.  Neurologic: Alert and oriented x 3.  Psych: Normal affect. Extremities: No clubbing or cyanosis.  HEENT: Normal.   ECG: Most recent ECG reviewed.      ASSESSMENT AND PLAN: 1. Atrial fibrillation: Symptomatically improved with regards to palpitations and exertional dyspnea. I previously increased long-acting diltiazem to 240 mg q am and 120 mg a pm, which may be responsible for her leg swelling but she also has a cardiomyopathy.  Will switch diltiazem to Toprol-XL 50 mg bid to see if this alleviates ankle and feet swelling. Continue  apixaban for thromboembolic risk reduction given CHADSVASC score of 3 (age, gender).  2. Cardiomyopathy, LVEF 45-50%: Probably tachycardia-mediated. Will reassess in several months after adequate HR control.  3. Valvular heart disease: Will monitor with periodic echocardiograms.  4. Bilateral leg/ankle/feet swelling: I previously increased long-acting diltiazem to 240 mg q am and 120 mg a pm, which may be responsible for her leg swelling but she also has a cardiomyopathy.  Will switch diltiazem to Toprol-XL 50 mg bid to see if this alleviates ankle and feet swelling. Continue Lasix 20 mg prn with KCl.  Dispo: f/u 1 month with ECG.   Kate Sable, M.D., F.A.C.C.

## 2015-08-27 NOTE — Patient Instructions (Signed)
Your physician has recommended you make the following change in your medication:  Stop diltiazem. Start toprol xl 50 mg twice daily. Continue all other medications the same. Your physician recommends that you schedule a follow-up appointment in: 1 month in [place] with an EKG.

## 2015-09-05 ENCOUNTER — Telehealth: Payer: Self-pay | Admitting: Cardiovascular Disease

## 2015-09-05 NOTE — Telephone Encounter (Signed)
-----   Message from Arnoldo Lenis, MD sent at 09/05/2015 12:36 PM EDT ----- Please get a further history regarding her symptoms. Does she have any other symptoms related to the SOB (chest pain, palpitations, woresening swelling, etc)  JB ----- Message -----    From: Bernita Raisin, RN    Sent: 09/05/2015   8:49 AM      To: Arnoldo Lenis, MD  He started vacation today, the 8th and 9th were added on ----- Message -----    From: Arnoldo Lenis, MD    Sent: 09/05/2015   8:40 AM      To: Bernita Raisin, RN  Received your message on this patient, I thought Dr Raliegh Ip was not out until next week. He is out for the rest of the week?  JB

## 2015-09-05 NOTE — Telephone Encounter (Signed)
First week off CCB she felt fine,swelling in legs resolved. Second week on BB she is SOB even at rest.States she may have gained a lbs or two.She has not used her prn lasix/potassium ans d said she may try that

## 2015-09-05 NOTE — Telephone Encounter (Signed)
Would try taking her lasix for the next few days and following symptoms. Does she have any history or asthma or COPD, sometimes medicines like metoprolol can make those act up.   Zandra Abts MD

## 2015-09-05 NOTE — Telephone Encounter (Signed)
Pt is having SOB since changing her medication

## 2015-09-05 NOTE — Telephone Encounter (Signed)
No history COPD, asthma will call abck after using lasix

## 2015-09-05 NOTE — Telephone Encounter (Signed)
Will forward to Dr Harl Bowie covering for Dr Bronson Ing

## 2015-09-09 ENCOUNTER — Telehealth: Payer: Self-pay | Admitting: Adult Health

## 2015-09-09 ENCOUNTER — Emergency Department (HOSPITAL_COMMUNITY): Payer: Medicare PPO

## 2015-09-09 ENCOUNTER — Encounter (HOSPITAL_COMMUNITY): Payer: Self-pay | Admitting: Emergency Medicine

## 2015-09-09 ENCOUNTER — Emergency Department (HOSPITAL_COMMUNITY)
Admission: EM | Admit: 2015-09-09 | Discharge: 2015-09-09 | Disposition: A | Discharge status: Home / Self Care | Payer: Medicare PPO | Attending: Emergency Medicine | Admitting: Emergency Medicine

## 2015-09-09 DIAGNOSIS — H409 Unspecified glaucoma: Secondary | ICD-10-CM | POA: Diagnosis not present

## 2015-09-09 DIAGNOSIS — R5383 Other fatigue: Secondary | ICD-10-CM | POA: Diagnosis not present

## 2015-09-09 DIAGNOSIS — I4891 Unspecified atrial fibrillation: Secondary | ICD-10-CM | POA: Diagnosis not present

## 2015-09-09 DIAGNOSIS — Z7901 Long term (current) use of anticoagulants: Secondary | ICD-10-CM | POA: Diagnosis not present

## 2015-09-09 DIAGNOSIS — R0602 Shortness of breath: Secondary | ICD-10-CM | POA: Diagnosis present

## 2015-09-09 DIAGNOSIS — Z79899 Other long term (current) drug therapy: Secondary | ICD-10-CM | POA: Insufficient documentation

## 2015-09-09 DIAGNOSIS — M7989 Other specified soft tissue disorders: Secondary | ICD-10-CM | POA: Diagnosis not present

## 2015-09-09 LAB — CBC WITH DIFFERENTIAL/PLATELET
Basophils Absolute: 0 10*3/uL (ref 0.0–0.1)
Basophils Relative: 1 % (ref 0–1)
Eosinophils Absolute: 0 10*3/uL (ref 0.0–0.7)
Eosinophils Relative: 1 % (ref 0–5)
HEMATOCRIT: 43.8 % (ref 36.0–46.0)
HEMOGLOBIN: 14.8 g/dL (ref 12.0–15.0)
LYMPHS ABS: 2.1 10*3/uL (ref 0.7–4.0)
LYMPHS PCT: 37 % (ref 12–46)
MCH: 31.5 pg (ref 26.0–34.0)
MCHC: 33.8 g/dL (ref 30.0–36.0)
MCV: 93.2 fL (ref 78.0–100.0)
MONOS PCT: 9 % (ref 3–12)
Monocytes Absolute: 0.5 10*3/uL (ref 0.1–1.0)
NEUTROS ABS: 3 10*3/uL (ref 1.7–7.7)
NEUTROS PCT: 52 % (ref 43–77)
Platelets: 206 10*3/uL (ref 150–400)
RBC: 4.7 MIL/uL (ref 3.87–5.11)
RDW: 13.5 % (ref 11.5–15.5)
WBC: 5.6 10*3/uL (ref 4.0–10.5)

## 2015-09-09 LAB — BRAIN NATRIURETIC PEPTIDE: B NATRIURETIC PEPTIDE 5: 459 pg/mL — AB (ref 0.0–100.0)

## 2015-09-09 LAB — BASIC METABOLIC PANEL
Anion gap: 7 (ref 5–15)
BUN: 11 mg/dL (ref 6–20)
CHLORIDE: 102 mmol/L (ref 101–111)
CO2: 29 mmol/L (ref 22–32)
Calcium: 8.9 mg/dL (ref 8.9–10.3)
Creatinine, Ser: 0.79 mg/dL (ref 0.44–1.00)
GFR calc Af Amer: >60 mL/min (ref >60)
GFR calc non Af Amer: >60 mL/min (ref >60)
Glucose, Bld: 95 mg/dL (ref 65–99)
Potassium: 4 mmol/L (ref 3.5–5.1)
Sodium: 138 mmol/L (ref 135–145)

## 2015-09-09 LAB — TROPONIN I: Troponin I: <0.03 ng/mL (ref <0.031)

## 2015-09-09 MED ORDER — METOPROLOL SUCCINATE ER 25 MG PO TB24: 25.0000 mg | ORAL_TABLET | Freq: Two times a day (BID) | ORAL | Status: DC

## 2015-09-09 NOTE — Telephone Encounter (Signed)
Called PT back and she states that she is feeling a lot worse than last week. She is having worsening SOB " whether sitting, standing or talking" she claims. She took 40 mg of lasix on Thursday & Friday, but went back to 20 mg on Saturday & Sunday. She denies any CP- just very fatigued & SOB.  Please advise.

## 2015-09-09 NOTE — ED Provider Notes (Signed)
CSN: [id number]     Arrival date & time 09/09/15  1219 History   First MD Initiated Contact with Patient 09/09/15 1302     Chief Complaint  Patient presents with  . Atrial Fibrillation     (Consider location/radiation/quality/duration/timing/severity/associated sxs/prior Treatment) HPI Comments: 74 y.o. Female with history of atrial fibrillation/atrial flutter presents for shortness of breath, leg swelling, and fatigue.  The patient reports that these are the symptoms that she had when diagnosed with atrial fibrillation and that they had resolved transiently when her medications were adjusted but that over the last week they have returned.  She said that she feels fine at rest and when laying down.  She was switched to Toprol about 1 week ago from Cardizem.  She has been taking Lasix PRN.  She denies chest pain.  No cough or fever.  She talked to her cardiologist's office who isntructed her to present to the ED for evaluation.     Past Medical History  Diagnosis Date  . Glaucoma    Past Surgical History  Procedure Laterality Date  . Appendectomy     History reviewed. No pertinent family history. Social History  Substance Use Topics  . Smoking status: Never Smoker   . Smokeless tobacco: None  . Alcohol Use: 0.0 oz/week    0 Standard drinks or equivalent per week     Comment: occasional   OB History    No data available     Review of Systems  Constitutional: Positive for fatigue. Negative for fever, chills and appetite change.  HENT: Negative for congestion, postnasal drip and rhinorrhea.   Eyes: Negative for pain and redness.  Respiratory: Positive for shortness of breath. Negative for cough, chest tightness and wheezing.   Cardiovascular: Positive for palpitations and leg swelling. Negative for chest pain.  Gastrointestinal: Negative for nausea, vomiting, abdominal pain, diarrhea and constipation.  Genitourinary: Negative for dysuria, urgency and hematuria.  Musculoskeletal:  Negative for myalgias and back pain.  Skin: Negative for rash.  Neurological: Negative for dizziness, weakness, light-headedness and headaches.  Hematological: Bruises/bleeds easily (on Eliquis).      Allergies  Review of patient's allergies indicates no known allergies.  Home Medications   Prior to Admission medications   Medication Sig Start Date End Date Taking? Authorizing Provider  apixaban (ELIQUIS) 5 MG TABS tablet Take 1 tablet (5 mg total) by mouth 2 (two) times daily. 05/29/15  Yes Herminio Commons, MD  furosemide (LASIX) 20 MG tablet Take 40 mg for 3 days starting today 08/06/15, and the take 20 mg daily prn as needed for swelling Patient taking differently: Take 20 mg by mouth daily.  08/06/15  Yes Herminio Commons, MD  latanoprost (XALATAN) 0.005 % ophthalmic solution Place 1 drop into both eyes at bedtime.  02/26/15  Yes Historical Provider, MD  metoprolol succinate (TOPROL-XL) 50 MG 24 hr tablet Take 1 tablet (50 mg total) by mouth 2 (two) times daily. Take with or immediately following a meal. 08/27/15  Yes Herminio Commons, MD  potassium chloride (K-DUR) 10 MEQ tablet Take 20 meq (2 tabs) starting today 08/06/15 for the next 3 days and then take 10 meq only on days you taking Lasix for swelling Patient taking differently: Take 10 mEq by mouth daily.  08/06/15  Yes Herminio Commons, MD  timolol (TIMOPTIC) 0.5 % ophthalmic solution Place 1 drop into the left eye 2 (two) times daily.   Yes Historical Provider, MD  metoprolol succinate (TOPROL-XL) 25 MG  24 hr tablet Take 1 tablet (25 mg total) by mouth 2 (two) times daily. 09/09/15   Harvel Quale, MD   BP 117/75 mmHg  Pulse 94  Temp(Src) 98 F (36.7 C) (Oral)  Resp 18  Ht 5\' 5"  (1.651 m)  SpO2 100% Physical Exam  Constitutional: She is oriented to person, place, and time. She appears well-developed and well-nourished. No distress.  HENT:  Head: Normocephalic and atraumatic.  Right Ear: External ear normal.  Left  Ear: External ear normal.  Mouth/Throat: Oropharynx is clear and moist. No oropharyngeal exudate.  Eyes: EOM are normal. Pupils are equal, round, and reactive to light.  Neck: Normal range of motion. Neck supple. No JVD present.  Cardiovascular: Normal pulses.  An irregularly irregular rhythm present. Tachycardia present.   Pulmonary/Chest: Effort normal. No respiratory distress. She has no wheezes. She has no rales.  Abdominal: Soft. She exhibits no distension. There is no tenderness.  Musculoskeletal: She exhibits edema (bilateral, symmetric, trace edema of the bilateral lower extremities).  Neurological: She is alert and oriented to person, place, and time.  Skin: Skin is warm and dry. No rash noted. She is not diaphoretic.  Vitals reviewed.   ED Course  Procedures (including critical care time) Labs Review Labs Reviewed  BRAIN NATRIURETIC PEPTIDE - Abnormal; Notable for the following:    B Natriuretic Peptide 459.0 (*)    All other components within normal limits  CBC WITH DIFFERENTIAL/PLATELET  BASIC METABOLIC PANEL  TROPONIN I    Imaging Review Dg Chest Portable 1 View  09/09/2015   CLINICAL DATA:  Atrial flutter since last Tuesday. Shortness of breath.  EXAM: PORTABLE CHEST - 1 VIEW  COMPARISON:  None. Patient's prior chest x-ray from 2002 is not available for comparison.  FINDINGS: The mediastinal contour is normal. The heart size is upper limits are normal. The lungs are hyperinflated. Both lungs are clear. The visualized skeletal structures are unremarkable.  IMPRESSION: No active cardiopulmonary disease.  Emphysema.   Electronically Signed   By: Abelardo Diesel M.D.   On: 09/09/2015 13:55   I have personally reviewed and evaluated these images and lab results as part of my medical decision-making.   EKG Interpretation   Date/Time:  Monday September 09 2015 12:59:24 EDT Ventricular Rate:  111 PR Interval:    QRS Duration: 79 QT Interval:  338 QTC Calculation: 459 R  Axis:   85 Text Interpretation:  Atrial fibrillation Borderline right axis deviation  Nonspecific repol abnormality, diffuse leads No significant change since  last tracing Confirmed by NGUYEN, EMILY ([id number]) on 09/09/2015 7:48:32 PM      MDM  Patient seen and evaluated in stable condition.  EKG consistent with atrial fibrillation.  Heart rate fluctuated between 80s-110s.  BP stable.  Patient well appearing.  BNP elevated but other labs unremarkable and chest xray clear.  Discussed with cardiologist on call for Dr. Bronson Ing who agreed with plan for discharge and recommended the patient take her lasix daily and to take Toprol XL 75 mg twice daily and follow up in the office as soon as possible.  Discussed results and plan of care with patient and her husband who expressed understanding and agreement with the plan of care.  All questions were answered prior to discharge.  Patient was discharged home in stable condition. Final diagnoses:  Atrial fibrillation, unspecified    1. Atrial fibrillation/atrial flutter    Harvel Quale, MD 09/09/15 1949

## 2015-09-09 NOTE — Discharge Instructions (Signed)
Take your 20 mg of Lasix Daily and begin taking your Toprol at 75 mg twice daily.  Follow up with your cardiology office as soon as possible.  Atrial Fibrillation Atrial fibrillation is a type of irregular heart rhythm (arrhythmia). During atrial fibrillation, the upper chambers of the heart (atria) quiver continuously in a chaotic pattern. This causes an irregular and often rapid heart rate.  Atrial fibrillation is the result of the heart becoming overloaded with disorganized signals that tell it to beat. These signals are normally released one at a time by a part of the right atrium called the sinoatrial node. They then travel from the atria to the lower chambers of the heart (ventricles), causing the atria and ventricles to contract and pump blood as they pass. In atrial fibrillation, parts of the atria outside of the sinoatrial node also release these signals. This results in two problems. First, the atria receive so many signals that they do not have time to fully contract. Second, the ventricles, which can only receive one signal at a time, beat irregularly and out of rhythm with the atria.  There are three types of atrial fibrillation:   Paroxysmal. Paroxysmal atrial fibrillation starts suddenly and stops on its own within a week.  Persistent. Persistent atrial fibrillation lasts for more than a week. It may stop on its own or with treatment.  Permanent. Permanent atrial fibrillation does not go away. Episodes of atrial fibrillation may lead to permanent atrial fibrillation. Atrial fibrillation can prevent your heart from pumping blood normally. It increases your risk of stroke and can lead to heart failure.  CAUSES   Heart conditions, including a heart attack, heart failure, coronary artery disease, and heart valve conditions.   Inflammation of the sac that surrounds the heart (pericarditis).  Blockage of an artery in the lungs (pulmonary embolism).  Pneumonia or other  infections.  Chronic lung disease.  Thyroid problems, especially if the thyroid is overactive (hyperthyroidism).  Caffeine, excessive alcohol use, and use of some illegal drugs.   Use of some medicines, including certain decongestants and diet pills.  Heart surgery.   Birth defects.  Sometimes, no cause can be found. When this happens, the atrial fibrillation is called lone atrial fibrillation. The risk of complications from atrial fibrillation increases if you have lone atrial fibrillation and you are age 53 years or older. RISK FACTORS  Heart failure.  Coronary artery disease.  Diabetes mellitus.   High blood pressure (hypertension).   Obesity.   Other arrhythmias.   Increased age. SIGNS AND SYMPTOMS   A feeling that your heart is beating rapidly or irregularly.   A feeling of discomfort or pain in your chest.   Shortness of breath.   Sudden light-headedness or weakness.   Getting tired easily when exercising.   Urinating more often than normal (mainly when atrial fibrillation first begins).  In paroxysmal atrial fibrillation, symptoms may start and suddenly stop. DIAGNOSIS  Your health care provider may be able to detect atrial fibrillation when taking your pulse. Your health care provider may have you take a test called an ambulatory electrocardiogram (ECG). An ECG records your heartbeat patterns over a 24-hour period. You may also have other tests, such as:  Transthoracic echocardiogram (TTE). During echocardiography, sound waves are used to evaluate how blood flows through your heart.  Transesophageal echocardiogram (TEE).  Stress test. There is more than one type of stress test. If a stress test is needed, ask your health care provider about which type  is best for you.  Chest X-ray exam.  Blood tests.  Computed tomography (CT). TREATMENT  Treatment may include:  Treating any underlying conditions. For example, if you have an overactive  thyroid, treating the condition may correct atrial fibrillation.  Taking medicine. Medicines may be given to control a rapid heart rate or to prevent blood clots, heart failure, or a stroke.  Having a procedure to correct the rhythm of the heart:  Electrical cardioversion. During electrical cardioversion, a controlled, low-energy shock is delivered to the heart through your skin. If you have chest pain, very low blood pressure, or sudden heart failure, this procedure may need to be done as an emergency.  Catheter ablation. During this procedure, heart tissues that send the signals that cause atrial fibrillation are destroyed.  Surgical ablation. During this surgery, thin lines of heart tissue that carry the abnormal signals are destroyed. This procedure can either be an open-heart surgery or a minimally invasive surgery. With the minimally invasive surgery, small cuts are made to access the heart instead of a large opening.  Pulmonary venous isolation. During this surgery, tissue around the veins that carry blood from the lungs (pulmonary veins) is destroyed. This tissue is thought to carry the abnormal signals. HOME CARE INSTRUCTIONS   Take medicines only as directed by your health care provider. Some medicines can make atrial fibrillation worse or recur.  If blood thinners were prescribed by your health care provider, take them exactly as directed. Too much blood-thinning medicine can cause bleeding. If you take too little, you will not have the needed protection against stroke and other problems.  Perform blood tests at home if directed by your health care provider. Perform blood tests exactly as directed.  Quit smoking if you smoke.  Do not drink alcohol.  Do not drink caffeinated beverages such as coffee, soda, and some teas. You may drink decaffeinated coffee, soda, or tea.   Maintain a healthy weight.Do not use diet pills unless your health care provider approves. They may make  heart problems worse.   Follow diet instructions as directed by your health care provider.  Exercise regularly as directed by your health care provider.  Keep all follow-up visits as directed by your health care provider. This is important. PREVENTION  The following substances can cause atrial fibrillation to recur:   Caffeinated beverages.  Alcohol.  Certain medicines, especially those used for breathing problems.  Certain herbs and herbal medicines, such as those containing ephedra or ginseng.  Illegal drugs, such as cocaine and amphetamines. Sometimes medicines are given to prevent atrial fibrillation from recurring. Proper treatment of any underlying condition is also important in helping prevent recurrence.  SEEK MEDICAL CARE IF:  You notice a change in the rate, rhythm, or strength of your heartbeat.  You suddenly begin urinating more frequently.  You tire more easily when exerting yourself or exercising. SEEK IMMEDIATE MEDICAL CARE IF:   You have chest pain, abdominal pain, sweating, or weakness.  You feel nauseous.  You have shortness of breath.  You suddenly have swollen feet and ankles.  You feel dizzy.  Your face or limbs feel numb or weak.  You have a change in your vision or speech. MAKE SURE YOU:   Understand these instructions.  Will watch your condition.  Will get help right away if you are not doing well or get worse. Document Released: 12/14/2005 Document Revised: 04/30/2014 Document Reviewed: 01/24/2013 [facility] Patient Information 2015 [place], [place]. This information is not intended to  replace advice given to you by your health care provider. Make sure you discuss any questions you have with your health care provider. ° °

## 2015-09-09 NOTE — ED Notes (Signed)
First diagnose with Afib May 01, 2015.  Treated with medications and A-Fib resulted.  Since last Tuesday having A-fib. Thursday pt called doctors last and told to start Lasix over the weekend.  symptoms did not get any better and was told to come to ED.  Denies any chest.  C.o SOB and fatigue.  Any movement makes pt SOB.

## 2015-09-09 NOTE — ED Notes (Signed)
Patient given discharge instruction, verbalized understand. IV removed, band aid applied. Patient ambulatory out of the department.  

## 2015-09-09 NOTE — Telephone Encounter (Signed)
Unclear cause of her symptoms, concerning they are getting worst. The quickest way to figure it out would be to be evaluated in the ER which I recommend.  Zandra Abts MD

## 2015-09-09 NOTE — Telephone Encounter (Signed)
Patient called to speak with nurse in reference to her symptoms worsening

## 2015-09-09 NOTE — Telephone Encounter (Signed)
Would like return phone call regarding symptoms / tg

## 2015-09-09 NOTE — Telephone Encounter (Signed)
Spoke to patient and let her know with her symptoms getting worse that it was best she be evaluated in ER. She voiced understanding and states that she will go get checked out.

## 2015-09-18 ENCOUNTER — Ambulatory Visit (INDEPENDENT_AMBULATORY_CARE_PROVIDER_SITE_OTHER): Payer: Medicare PPO | Admitting: Physician Assistant

## 2015-09-18 ENCOUNTER — Encounter: Payer: Self-pay | Admitting: Physician Assistant

## 2015-09-18 VITALS — BP 112/64 | HR 60 | Ht 65.0 in | Wt 114.4 lb

## 2015-09-18 DIAGNOSIS — R600 Localized edema: Secondary | ICD-10-CM

## 2015-09-18 DIAGNOSIS — I4891 Unspecified atrial fibrillation: Secondary | ICD-10-CM | POA: Diagnosis not present

## 2015-09-18 DIAGNOSIS — I429 Cardiomyopathy, unspecified: Secondary | ICD-10-CM

## 2015-09-18 MED ORDER — METOPROLOL SUCCINATE ER 100 MG PO TB24: 100.0000 mg | ORAL_TABLET | Freq: Two times a day (BID) | ORAL | Status: DC

## 2015-09-18 MED ORDER — METOPROLOL SUCCINATE ER 100 MG PO TB24: 100.0000 mg | ORAL_TABLET | Freq: Every day | ORAL | Status: DC

## 2015-09-18 NOTE — Assessment & Plan Note (Signed)
Patient's EF was 45-50% on 2-D echo 04/2015. 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 Increasing Toprol to 100 mg twice a day to help with this. Continue Lasix 20 mg daily. This is controlling her edema.

## 2015-09-18 NOTE — Assessment & Plan Note (Signed)
Patient doesn't seem to tolerate atrial fibrillation very well. She was very active and now can hardly do anything. I suspect her heart rates are still fast with activity because her resting is over 100. Increase Toprol to 100 mg twice a day. Continue abixaban 5 mg BID. Follow-up with Dr. Bronson Ing Sept 30 as scheduled.

## 2015-09-18 NOTE — Assessment & Plan Note (Signed)
Patient only has a trace of edema today. It has improved significantly since stopping diltiazem. Continue Lasix.

## 2015-09-18 NOTE — Progress Notes (Signed)
Cardiology Office Note   Date:  09/18/2015   ID:  Kelly Dougherty, DOB 09/18/1941, MRN [id number]  PCP:  Merrilee Seashore, MD  Cardiologist:  Dr.Koneswaran  Chief Complaint: Palpitations, weakness    History of Present Illness: Kelly Dougherty is a 74 y.o. female who presents for follow-up emergency room visit. This patient has a history of atrial fibrillation since 04/2015. She was seen by Dr. Bronson Ing 08/27/15 at which time her diltiazem was switched to Toprol-XL 50 mg twice a day to help alleviate ankle and feet swelling. She is also on a apixiban for thromboembolic risk reduction given CHADSVASC score of 3. 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 Plan is to reassess this in several months.    She went to the emergency room 09/09/15 with atrial fibrillation at 110 bpm. BNP was 459. Chest x-ray was clear. Toprol was increased to 75 mg twice a day and she was told to take her Lasix daily. She is here for follow-up.  Patient complains of having no energy and getting out of breath with very little activity. She says her heart races on occasion at rest and with exertion. She just wants to become active again and feels like she is not able to do much of anything. Resting heart rate today is 10 1 bpm.    Past Medical History  Diagnosis Date  . Glaucoma     Past Surgical History  Procedure Laterality Date  . Appendectomy       Current Outpatient Prescriptions  Medication Sig Dispense Refill  . apixaban (ELIQUIS) 5 MG TABS tablet Take 1 tablet (5 mg total) by mouth 2 (two) times daily. 60 tablet 6  . furosemide (LASIX) 20 MG tablet Take 40 mg for 3 days starting today 08/06/15, and the take 20 mg daily prn as needed for swelling (Patient taking differently: Take 20 mg by mouth daily. ) 90 tablet 3  . latanoprost (XALATAN) 0.005 % ophthalmic solution Place 1 drop into both eyes at bedtime.     . potassium chloride (K-DUR) 10 MEQ tablet Take 20 meq (2 tabs)  starting today 08/06/15 for the next 3 days and then take 10 meq only on days you taking Lasix for swelling (Patient taking differently: Take 10 mEq by mouth daily. ) 90 tablet 3  . timolol (TIMOPTIC) 0.5 % ophthalmic solution Place 1 drop into the left eye 2 (two) times daily.    . metoprolol succinate (TOPROL-XL) 100 MG 24 hr tablet Take 1 tablet (100 mg total) by mouth daily. Take with or immediately following a meal. 90 tablet 3   No current facility-administered medications for this visit.    Allergies:   Review of patient's allergies indicates no known allergies.    Social History:  The patient  reports that she has never smoked. She does not have any smokeless tobacco history on file. She reports that she drinks alcohol. She reports that she does not use illicit drugs.   Family History:  The patient's    family history is not on file.    ROS:  Please see the history of present illness.   Otherwise, review of systems are positive for none.   All other systems are reviewed and negative.    PHYSICAL EXAM: VS:  BP 112/64 mmHg  Pulse 60  Ht 5\' 5"  (1.651 m)  Wt 114 lb 6.4 oz (51.891 kg)  BMI 19.04 kg/m2  SpO2 92% , BMI Body mass index is 19.04 kg/(m^2). GEN:  Well nourished, well developed, in no acute distress Neck: no JVD, HJR, carotid bruits, or masses Cardiac: Irregular irregular with 1/6 systolic murmur at the left sternal border, no gallop, rubs, thrill or heave,  Respiratory:  clear to auscultation bilaterally, normal work of breathing GI: soft, nontender, nondistended, + BS MS: no deformity or atrophy Extremities: Trace of edema in bilateral ankles without cyanosis, clubbing, good distal pulses bilaterally.  Skin: warm and dry, no rash Neuro:  Strength and sensation are intact    EKG:  EKG is ordered today. The ekg ordered today demonstrates atrial fibrillation 101 bpm nonspecific ST-T wave changes Recent Labs: 05/01/2015: Magnesium 2.4; TSH 2.915 09/09/2015: B Natriuretic  Peptide 459.0*; BUN 11; Creatinine, Ser 0.79; Hemoglobin 14.8; Platelets 206; Potassium 4.0; Sodium 138    Lipid Panel No results found for: CHOL, TRIG, HDL, CHOLHDL, VLDL, LDLCALC, LDLDIRECT    Wt Readings from Last 3 Encounters:  09/18/15 114 lb 6.4 oz (51.891 kg)  08/27/15 113 lb (51.256 kg)  08/06/15 112 lb 9.6 oz (51.075 kg)      Other studies Reviewed: Additional studies/ records that were reviewed today include and review of the records demonstrates:  2-D echo 05/21/15 Study Conclusions  - Left ventricle: The cavity size was normal. Wall thickness was   normal. Systolic function was mildly reduced. The estimated   ejection fraction was in the range of 45% to 50%. Diffuse   hypokinesis. The study is not technically sufficient to allow   evaluation of LV diastolic function. - Aortic valve: There was mild regurgitation. - Mitral valve: Calcified annulus. Mildly thickened leaflets .   There was mild regurgitation. - Left atrium: The atrium was moderately dilated. - Right ventricle: Systolic function was mildly reduced. - Right atrium: The atrium was moderately to severely dilated.   Central venous pressure (est): 8 mm Hg. - Atrial septum: No defect or patent foramen ovale was identified. - Tricuspid valve: There was moderate regurgitation. - Pulmonary arteries: PA peak pressure: 33 mm Hg (S). - Pericardium, extracardiac: There was no pericardial effusion.  Impressions:  - Normal LV wall thickness with LVEF 45-50% and diffuse   hypokinesis, indeterminate diastolic function. Moderate left   atrial enlargement. Mild mitral regurgitation. Mild aortic   regurgitation. Mildly reduced RV contraction. Moderate tricuspid   regurgitation with PASP 33 mmHg. Moderate to severe right atrial   enlargement.     ASSESSMENT AND PLAN:  Atrial fibrillation Patient doesn't seem to tolerate atrial fibrillation very well. She was very active and now can hardly do anything. I suspect  her heart rates are still fast with activity because her resting is over 100. Increase Toprol to 100 mg twice a day. Continue abixaban 5 mg BID. Follow-up with Dr. Bronson Ing Sept 30 as scheduled.  Cardiomyopathy Patient's EF was 45-50% on 2-D echo 04/2015. 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 Increasing Toprol to 100 mg twice a day to help with this. Continue Lasix 20 mg daily. This is controlling her edema.  Leg edema Patient only has a trace of edema today. It has improved significantly since stopping diltiazem. Continue Lasix.    Sumner Boast, PA-C  09/18/2015 11:32 AM    [facility] [address], [place], [place]  [address] Phone: [phone]; Fax: [phone]

## 2015-09-18 NOTE — Patient Instructions (Addendum)
Your physician recommends that you schedule a follow-up appointment with Dr. Bronson Ing  Your physician has recommended you make the following change in your medication:  Increase Toprol XL to 100 mg Take 1 Tablet 2 times Daily  Thank you for choosing [facility]!

## 2015-09-27 ENCOUNTER — Ambulatory Visit (INDEPENDENT_AMBULATORY_CARE_PROVIDER_SITE_OTHER): Payer: Medicare PPO

## 2015-09-27 DIAGNOSIS — I4891 Unspecified atrial fibrillation: Secondary | ICD-10-CM | POA: Diagnosis not present

## 2015-09-27 MED ORDER — DIGOXIN 125 MCG PO TABS: 0.1250 mg | ORAL_TABLET | Freq: Every day | ORAL | Status: DC

## 2015-09-27 NOTE — Patient Instructions (Signed)
Your physician recommends that you schedule a follow-up appointment on Friday October 7 @ 2:00  START DEGOXIN .125 MG DAILY  Thanks for choosing [facility]!!!

## 2015-10-02 ENCOUNTER — Ambulatory Visit (INDEPENDENT_AMBULATORY_CARE_PROVIDER_SITE_OTHER): Payer: Medicare PPO | Admitting: Cardiovascular Disease

## 2015-10-02 ENCOUNTER — Encounter: Payer: Self-pay | Admitting: Cardiovascular Disease

## 2015-10-02 ENCOUNTER — Other Ambulatory Visit (HOSPITAL_COMMUNITY)
Admission: RE | Admit: 2015-10-02 | Discharge: 2015-10-02 | Disposition: A | Discharge status: Home / Self Care | Payer: Medicare PPO | Source: Ambulatory Visit | Attending: Cardiovascular Disease | Admitting: Cardiovascular Disease

## 2015-10-02 ENCOUNTER — Encounter: Payer: Self-pay | Admitting: *Deleted

## 2015-10-02 VITALS — BP 115/76 | HR 65 | Ht 65.0 in | Wt 110.2 lb

## 2015-10-02 DIAGNOSIS — I429 Cardiomyopathy, unspecified: Secondary | ICD-10-CM | POA: Diagnosis not present

## 2015-10-02 DIAGNOSIS — Z9289 Personal history of other medical treatment: Secondary | ICD-10-CM

## 2015-10-02 DIAGNOSIS — Z23 Encounter for immunization: Secondary | ICD-10-CM | POA: Diagnosis not present

## 2015-10-02 DIAGNOSIS — I38 Endocarditis, valve unspecified: Secondary | ICD-10-CM

## 2015-10-02 DIAGNOSIS — R600 Localized edema: Secondary | ICD-10-CM

## 2015-10-02 DIAGNOSIS — R0609 Other forms of dyspnea: Secondary | ICD-10-CM

## 2015-10-02 DIAGNOSIS — I4891 Unspecified atrial fibrillation: Secondary | ICD-10-CM | POA: Diagnosis not present

## 2015-10-02 DIAGNOSIS — R5383 Other fatigue: Secondary | ICD-10-CM

## 2015-10-02 DIAGNOSIS — Z87898 Personal history of other specified conditions: Secondary | ICD-10-CM

## 2015-10-02 LAB — CBC WITH DIFFERENTIAL/PLATELET
Basophils Absolute: 0 10*3/uL (ref 0.0–0.1)
Basophils Relative: 1 %
Eosinophils Absolute: 0.1 10*3/uL (ref 0.0–0.7)
Eosinophils Relative: 1 %
HEMATOCRIT: 45.1 % (ref 36.0–46.0)
HEMOGLOBIN: 15.5 g/dL — AB (ref 12.0–15.0)
LYMPHS ABS: 2 10*3/uL (ref 0.7–4.0)
LYMPHS PCT: 37 %
MCH: 32 pg (ref 26.0–34.0)
MCHC: 34.4 g/dL (ref 30.0–36.0)
MCV: 93.2 fL (ref 78.0–100.0)
Monocytes Absolute: 0.6 10*3/uL (ref 0.1–1.0)
Monocytes Relative: 12 %
NEUTROS PCT: 49 %
Neutro Abs: 2.6 10*3/uL (ref 1.7–7.7)
Platelets: 221 10*3/uL (ref 150–400)
RBC: 4.84 MIL/uL (ref 3.87–5.11)
RDW: 12.7 % (ref 11.5–15.5)
WBC: 5.3 10*3/uL (ref 4.0–10.5)

## 2015-10-02 LAB — BASIC METABOLIC PANEL
Anion gap: 8 (ref 5–15)
BUN: 12 mg/dL (ref 6–20)
CHLORIDE: 100 mmol/L — AB (ref 101–111)
CO2: 30 mmol/L (ref 22–32)
Calcium: 9.1 mg/dL (ref 8.9–10.3)
Creatinine, Ser: 0.89 mg/dL (ref 0.44–1.00)
GFR calc Af Amer: >60 mL/min (ref >60)
GFR calc non Af Amer: >60 mL/min (ref >60)
GLUCOSE: 95 mg/dL (ref 65–99)
POTASSIUM: 4.2 mmol/L (ref 3.5–5.1)
Sodium: 138 mmol/L (ref 135–145)

## 2015-10-02 NOTE — Progress Notes (Signed)
Patient ID: Kelly Dougherty, female   DOB: Apr 30, 1941, 74 y.o.   MRN: [id number]      SUBJECTIVE: The patient presents for follow-up of atrial fibrillation. Echocardiogram on 05/21/15 demonstrated mildly reduced left ventricular systolic function, LVEF 93-90%, diffuse hypokinesis, mild mitral and aortic regurgitation, moderate left atrial enlargement, mildly reduced right ventricular systolic function, and moderate to severe right atrial dilatation with moderate tricuspid regurgitation.  I had to previously switch from diltiazem to metoprolol due to significant pedal edema.  She was then evaluated in the ED for palpitations and atrial fibrillation. She then saw Gerrianne Scale PA-C who increased her metoprolol to 100 mg bid as she was mildly tachycardic and was feeling short of breath and fatigued.  She has not noticed much of a difference. She gets dyspneic with significant exertion and with climbing a flight of stairs. She said she is ready to try anything to make her feel better.  ECG shows atrial fibrillation/flutter, HR 86 bpm.   Soc: Married in 1959. Husband is a retired Marine scientist and studied at [employer]. 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 3 living children (one son is international Biomedical engineer for [employer] in [place]), 1 son committed suicide.   Review of Systems: As per "subjective", otherwise negative.  No Known Allergies  Current Outpatient Prescriptions  Medication Sig Dispense Refill  . apixaban (ELIQUIS) 5 MG TABS tablet Take 1 tablet (5 mg total) by mouth 2 (two) times daily. 60 tablet 6  . digoxin (LANOXIN) 0.125 MG tablet Take 1 tablet (0.125 mg total) by mouth daily. 90 tablet 3  . furosemide (LASIX) 20 MG tablet Take 40 mg for 3 days starting today 08/06/15, and the take 20 mg daily prn as needed for swelling (Patient taking differently: Take 20 mg by mouth daily. ) 90 tablet 3  . latanoprost (XALATAN) 0.005 % ophthalmic solution Place  1 drop into both eyes at bedtime.     . metoprolol succinate (TOPROL-XL) 100 MG 24 hr tablet Take 1 tablet (100 mg total) by mouth 2 (two) times daily. Take with or immediately following a meal. 180 tablet 3  . potassium chloride (K-DUR) 10 MEQ tablet Take 20 meq (2 tabs) starting today 08/06/15 for the next 3 days and then take 10 meq only on days you taking Lasix for swelling (Patient taking differently: Take 10 mEq by mouth daily. ) 90 tablet 3  . timolol (TIMOPTIC) 0.5 % ophthalmic solution Place 1 drop into the left eye 2 (two) times daily.     No current facility-administered medications for this visit.    Past Medical History  Diagnosis Date  . Glaucoma     Past Surgical History  Procedure Laterality Date  . Appendectomy      Social History   Social History  . Marital Status: Married    Spouse Name: N/A  . Number of Children: N/A  . Years of Education: N/A   Occupational History  . Not on file.   Social History Main Topics  . Smoking status: Never Smoker   . Smokeless tobacco: Not on file  . Alcohol Use: 0.0 oz/week    0 Standard drinks or equivalent per week     Comment: occasional  . Drug Use: No  . Sexual Activity: Not on file   Other Topics Concern  . Not on file   Social History Narrative     Filed Vitals:   10/02/15 1419  BP:  115/76  Pulse: 65  Height: 5\' 5"  (1.651 m)  Weight: 110 lb 3.2 oz (49.986 kg)  SpO2: 97%    PHYSICAL EXAM General: NAD Neck: No JVD, no thyromegaly or thyroid nodule.  Lungs: Clear to auscultation bilaterally with normal respiratory effort. CV: Nondisplaced PMI. Heart rate normal, irregular rhythm, normal S1/S2, no S3, no murmur. No pedal edema and normal pedal pulses.  Abdomen: Soft, nontender, no distention.  Skin: Intact without lesions or rashes.  Neurologic: Alert and oriented x 3.  Psych: Normal affect. Extremities: No clubbing or cyanosis.  HEENT: Normal.    ECG: Most recent ECG  reviewed.      ASSESSMENT AND PLAN: 1. Exertional dyspnea and fatigue in context of atrial fibrillation: Continued symptoms of exertional dyspnea and fatigue with significant exertion. Echocardiogram showed moderate left atrial dilatation. We spoke at length about different management options and have decided to proceed with elective cardioversion, which I will try and arrange this week. She has been on apixaban for several months and therefore does not require a TEE. I have instructed her to hold her evening dose of Toprol-XL the night before and her morning dose the day of the procedure, as well as her digoxin in order to prevent post-conversion pauses and/or significant bradycardia. Continue apixaban for thromboembolic risk reduction given CHADSVASC score of 3 (age, gender).  2. Cardiomyopathy, LVEF 45-50%: Probably tachycardia-mediated. Will reassess in several months after adequate HR control.  3. Valvular heart disease: Will monitor with periodic echocardiograms.  4. Bilateral leg/ankle/feet swelling: Resolved with Lasix and discontinuation of diltiazem. Continue Lasix 20 mg prn with KCl.  Dispo: f/u after cardioversion.   Kate Sable, M.D., F.A.C.C.

## 2015-10-02 NOTE — Patient Instructions (Signed)
Your physician recommends that you return for lab work Today  PLEASE HOLD [place]   Your physician recommends that you continue on your current medications as directed. Please refer to the Current Medication list given to you today.  Thank you for choosing [facility]!

## 2015-10-03 ENCOUNTER — Ambulatory Visit (HOSPITAL_COMMUNITY): Payer: Medicare PPO | Admitting: Anesthesiology

## 2015-10-03 ENCOUNTER — Ambulatory Visit (HOSPITAL_COMMUNITY)
Admission: RE | Admit: 2015-10-03 | Discharge: 2015-10-03 | Disposition: A | Discharge status: Home Health | Payer: Medicare PPO | Source: Ambulatory Visit | Attending: Cardiovascular Disease | Admitting: Cardiovascular Disease

## 2015-10-03 ENCOUNTER — Encounter (HOSPITAL_COMMUNITY)
Admission: RE | Disposition: A | Discharge status: Home Health | Payer: Self-pay | Source: Ambulatory Visit | Attending: Cardiovascular Disease

## 2015-10-03 ENCOUNTER — Telehealth: Payer: Self-pay

## 2015-10-03 ENCOUNTER — Encounter (HOSPITAL_COMMUNITY): Payer: Self-pay | Admitting: *Deleted

## 2015-10-03 DIAGNOSIS — I4891 Unspecified atrial fibrillation: Secondary | ICD-10-CM | POA: Diagnosis present

## 2015-10-03 DIAGNOSIS — R0600 Dyspnea, unspecified: Secondary | ICD-10-CM | POA: Insufficient documentation

## 2015-10-03 DIAGNOSIS — R0609 Other forms of dyspnea: Secondary | ICD-10-CM | POA: Insufficient documentation

## 2015-10-03 DIAGNOSIS — I482 Chronic atrial fibrillation, unspecified: Secondary | ICD-10-CM | POA: Insufficient documentation

## 2015-10-03 HISTORY — PX: CARDIOVERSION: SHX1299

## 2015-10-03 HISTORY — DX: Cardiac arrhythmia, unspecified: I49.9

## 2015-10-03 SURGERY — CARDIOVERSION
Anesthesia: Monitor Anesthesia Care

## 2015-10-03 MED ORDER — PROPOFOL 10 MG/ML IV BOLUS
INTRAVENOUS | Status: AC
  Filled 2015-10-03: qty 20

## 2015-10-03 MED ORDER — PROPOFOL 500 MG/50ML IV EMUL
INTRAVENOUS | Status: DC | PRN
  Administered 2015-10-03: 100 ug/kg/min via INTRAVENOUS

## 2015-10-03 MED ORDER — DRONEDARONE HCL 400 MG PO TABS: 400.0000 mg | ORAL_TABLET | Freq: Two times a day (BID) | ORAL | Status: DC

## 2015-10-03 MED ORDER — LACTATED RINGERS IV SOLN
INTRAVENOUS | Status: DC
  Administered 2015-10-03: 1000 mL via INTRAVENOUS

## 2015-10-03 MED ORDER — ONDANSETRON HCL 4 MG/2ML IJ SOLN: 4.0000 mg | Freq: Once | INTRAMUSCULAR | Status: DC | PRN

## 2015-10-03 MED ORDER — FENTANYL CITRATE (PF) 100 MCG/2ML IJ SOLN
25.0000 ug | Freq: Once | INTRAMUSCULAR | Status: AC
  Administered 2015-10-03: 25 ug via INTRAVENOUS

## 2015-10-03 MED ORDER — MIDAZOLAM HCL 5 MG/5ML IJ SOLN
INTRAMUSCULAR | Status: DC | PRN
  Administered 2015-10-03 (×2): 1 mg via INTRAVENOUS

## 2015-10-03 MED ORDER — FENTANYL CITRATE (PF) 100 MCG/2ML IJ SOLN: 25.0000 ug | INTRAMUSCULAR | Status: DC | PRN

## 2015-10-03 MED ORDER — FENTANYL CITRATE (PF) 100 MCG/2ML IJ SOLN
INTRAMUSCULAR | Status: AC
  Filled 2015-10-03: qty 2

## 2015-10-03 MED ORDER — LIDOCAINE HCL (PF) 1 % IJ SOLN
INTRAMUSCULAR | Status: AC
  Filled 2015-10-03: qty 5

## 2015-10-03 MED ORDER — ATROPINE SULFATE 1 MG/ML IJ SOLN
INTRAMUSCULAR | Status: AC
  Filled 2015-10-03: qty 1

## 2015-10-03 MED ORDER — MIDAZOLAM HCL 2 MG/2ML IJ SOLN
INTRAMUSCULAR | Status: AC
  Filled 2015-10-03: qty 4

## 2015-10-03 MED ORDER — FENTANYL CITRATE (PF) 100 MCG/2ML IJ SOLN
INTRAMUSCULAR | Status: AC
  Filled 2015-10-03: qty 4

## 2015-10-03 MED ORDER — ATROPINE SULFATE 0.4 MG/ML IJ SOLN
INTRAMUSCULAR | Status: DC | PRN
  Administered 2015-10-03: .25 mg via INTRAVENOUS

## 2015-10-03 NOTE — Telephone Encounter (Signed)
Per Dr Bronson Ing, needs f/u apt in 3 weeks with Dr.Koneswaran November 1 st at 9:20 am   Metoprolol and Digoxin discontinued   Start Multaq 400 mg twice a day,will call spouse later as pt is still in recovery from cardiocersion today

## 2015-10-03 NOTE — Transfer of Care (Signed)
Immediate Anesthesia Transfer of Care Note  Patient: Kelly Dougherty  Procedure(s) Performed: Procedure(s): CARDIOVERSION (N/A)  Patient Location: PACU  Anesthesia Type:MAC  Level of Consciousness: awake, alert  and oriented  Airway & Oxygen Therapy: Patient Spontanous Breathing and Patient connected to face mask oxygen  Post-op Assessment: Report given to RN  Post vital signs: Reviewed and stable  Last Vitals:  Filed Vitals:   10/03/15 0825  BP: 132/84  Pulse:   Temp:   Resp: 26    Complications: No apparent anesthesia complications

## 2015-10-03 NOTE — Interval H&P Note (Signed)
History and Physical Interval Note: No changes to history and physical. Will proceed with cardioversion as planned.  10/03/2015 9:15 AM  Kelly Dougherty  has presented today for surgery, with the diagnosis of a-fib  The various methods of treatment have been discussed with the patient and family. After consideration of risks, benefits and other options for treatment, the patient has consented to  Procedure(s): CARDIOVERSION (N/A) as a surgical intervention .  The patient's history has been reviewed, patient examined, no change in status, stable for surgery.  I have reviewed the patient's chart and labs.  Questions were answered to the patient's satisfaction.     Kate Sable A

## 2015-10-03 NOTE — H&P (View-Only) (Signed)
Patient ID: Kelly Dougherty, female   DOB: July 01, 1941, 74 y.o.   MRN: [id number]      SUBJECTIVE: The patient presents for follow-up of atrial fibrillation. Echocardiogram on 05/21/15 demonstrated mildly reduced left ventricular systolic function, LVEF 12-45%, diffuse hypokinesis, mild mitral and aortic regurgitation, moderate left atrial enlargement, mildly reduced right ventricular systolic function, and moderate to severe right atrial dilatation with moderate tricuspid regurgitation.  I had to previously switch from diltiazem to metoprolol due to significant pedal edema.  She was then evaluated in the ED for palpitations and atrial fibrillation. She then saw Gerrianne Scale PA-C who increased her metoprolol to 100 mg bid as she was mildly tachycardic and was feeling short of breath and fatigued.  She has not noticed much of a difference. She gets dyspneic with significant exertion and with climbing a flight of stairs. She said she is ready to try anything to make her feel better.  ECG shows atrial fibrillation/flutter, HR 86 bpm.   Soc: Married in 1959. Husband is a retired Marine scientist and studied at [employer]. 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 3 living children (one son is international Biomedical engineer for [employer] in [place]), 1 son committed suicide.   Review of Systems: As per "subjective", otherwise negative.  No Known Allergies  Current Outpatient Prescriptions  Medication Sig Dispense Refill  . apixaban (ELIQUIS) 5 MG TABS tablet Take 1 tablet (5 mg total) by mouth 2 (two) times daily. 60 tablet 6  . digoxin (LANOXIN) 0.125 MG tablet Take 1 tablet (0.125 mg total) by mouth daily. 90 tablet 3  . furosemide (LASIX) 20 MG tablet Take 40 mg for 3 days starting today 08/06/15, and the take 20 mg daily prn as needed for swelling (Patient taking differently: Take 20 mg by mouth daily. ) 90 tablet 3  . latanoprost (XALATAN) 0.005 % ophthalmic solution Place  1 drop into both eyes at bedtime.     . metoprolol succinate (TOPROL-XL) 100 MG 24 hr tablet Take 1 tablet (100 mg total) by mouth 2 (two) times daily. Take with or immediately following a meal. 180 tablet 3  . potassium chloride (K-DUR) 10 MEQ tablet Take 20 meq (2 tabs) starting today 08/06/15 for the next 3 days and then take 10 meq only on days you taking Lasix for swelling (Patient taking differently: Take 10 mEq by mouth daily. ) 90 tablet 3  . timolol (TIMOPTIC) 0.5 % ophthalmic solution Place 1 drop into the left eye 2 (two) times daily.     No current facility-administered medications for this visit.    Past Medical History  Diagnosis Date  . Glaucoma     Past Surgical History  Procedure Laterality Date  . Appendectomy      Social History   Social History  . Marital Status: Married    Spouse Name: N/A  . Number of Children: N/A  . Years of Education: N/A   Occupational History  . Not on file.   Social History Main Topics  . Smoking status: Never Smoker   . Smokeless tobacco: Not on file  . Alcohol Use: 0.0 oz/week    0 Standard drinks or equivalent per week     Comment: occasional  . Drug Use: No  . Sexual Activity: Not on file   Other Topics Concern  . Not on file   Social History Narrative     Filed Vitals:   10/02/15 1419  BP:  115/76  Pulse: 65  Height: 5\' 5"  (1.651 m)  Weight: 110 lb 3.2 oz (49.986 kg)  SpO2: 97%    PHYSICAL EXAM General: NAD Neck: No JVD, no thyromegaly or thyroid nodule.  Lungs: Clear to auscultation bilaterally with normal respiratory effort. CV: Nondisplaced PMI. Heart rate normal, irregular rhythm, normal S1/S2, no S3, no murmur. No pedal edema and normal pedal pulses.  Abdomen: Soft, nontender, no distention.  Skin: Intact without lesions or rashes.  Neurologic: Alert and oriented x 3.  Psych: Normal affect. Extremities: No clubbing or cyanosis.  HEENT: Normal.    ECG: Most recent ECG  reviewed.      ASSESSMENT AND PLAN: 1. Exertional dyspnea and fatigue in context of atrial fibrillation: Continued symptoms of exertional dyspnea and fatigue with significant exertion. Echocardiogram showed moderate left atrial dilatation. We spoke at length about different management options and have decided to proceed with elective cardioversion, which I will try and arrange this week. She has been on apixaban for several months and therefore does not require a TEE. I have instructed her to hold her evening dose of Toprol-XL the night before and her morning dose the day of the procedure, as well as her digoxin in order to prevent post-conversion pauses and/or significant bradycardia. Continue apixaban for thromboembolic risk reduction given CHADSVASC score of 3 (age, gender).  2. Cardiomyopathy, LVEF 45-50%: Probably tachycardia-mediated. Will reassess in several months after adequate HR control.  3. Valvular heart disease: Will monitor with periodic echocardiograms.  4. Bilateral leg/ankle/feet swelling: Resolved with Lasix and discontinuation of diltiazem. Continue Lasix 20 mg prn with KCl.  Dispo: f/u after cardioversion.   Kate Sable, M.D., F.A.C.C.

## 2015-10-03 NOTE — Procedures (Signed)
Elective external cardioversion performed in the PACU with anesthesia and nursing assistance. Appropriate hemodynamic monitoring performed throughout procedure. Initial rhythm was rate-controlled atrial fibrillation.  120J was administered and the patient successfully cardioverted to sinus bradycardia with sinus arrhythmia and PAC's, with nonspecific ST-T abnormalities. Atropine was administered. Post-cardioversion ECG obtained. No complications were observed.

## 2015-10-03 NOTE — Anesthesia Postprocedure Evaluation (Signed)
  Anesthesia Post-op Note  Patient: Kelly Dougherty  Procedure(s) Performed: Procedure(s): CARDIOVERSION (N/A)  Patient Location: PACU  Anesthesia Type:MAC  Level of Consciousness: awake, alert  and oriented  Airway and Oxygen Therapy: Patient Spontanous Breathing  Post-op Pain: none  Post-op Assessment: Post-op Vital signs reviewed, Patient's Cardiovascular Status Stable, Respiratory Function Stable, Patent Airway and No signs of Nausea or vomiting              Post-op Vital Signs: Reviewed and stable  Last Vitals:  Filed Vitals:   10/03/15 0919  BP: 102/57  Pulse: 47  Temp:   Resp: 20    Complications: No apparent anesthesia complications

## 2015-10-03 NOTE — Anesthesia Preprocedure Evaluation (Addendum)
Anesthesia Evaluation  Patient identified by MRN, date of birth, ID band Patient awake    Reviewed: Allergy & Precautions, NPO status , Patient's Chart, lab work & pertinent test results  Airway Mallampati: I  TM Distance: >3 FB     Dental  (+) Teeth Intact   Pulmonary neg pulmonary ROS,    breath sounds clear to auscultation       Cardiovascular +CHF (cardiomyopathy, bilat LE edema) and + DOE  + dysrhythmias Atrial Fibrillation  Rhythm:Irregular Rate:Normal     Neuro/Psych    GI/Hepatic negative GI ROS,   Endo/Other    Renal/GU      Musculoskeletal   Abdominal   Peds  Hematology   Anesthesia Other Findings   Reproductive/Obstetrics                            Anesthesia Physical Anesthesia Plan  ASA: III  Anesthesia Plan: MAC   Post-op Pain Management:    Induction: Intravenous  Airway Management Planned: Simple Face Mask  Additional Equipment:   Intra-op Plan:   Post-operative Plan:   Informed Consent: I have reviewed the patients History and Physical, chart, labs and discussed the procedure including the risks, benefits and alternatives for the proposed anesthesia with the patient or authorized representative who has indicated his/her understanding and acceptance.     Plan Discussed with:   Anesthesia Plan Comments:         Anesthesia Quick Evaluation

## 2015-10-03 NOTE — Discharge Instructions (Signed)
Electrical Cardioversion Electrical cardioversion is the delivery of a jolt of electricity to change the rhythm of the heart. Sticky patches or metal paddles are placed on the chest to deliver the electricity from a device. This is done to restore a normal rhythm. A rhythm that is too fast or not regular keeps the heart from pumping well. Electrical cardioversion is done in an emergency if:   There is low or no blood pressure as a result of the heart rhythm.   Normal rhythm must be restored as fast as possible to protect the brain and heart from further damage.   It may save a life. Cardioversion may be done for heart rhythms that are not immediately life threatening, such as atrial fibrillation or flutter, in which:   The heart is beating too fast or is not regular.   Medicine to change the rhythm has not worked.   It is safe to wait in order to allow time for preparation.  Symptoms of the abnormal rhythm are bothersome.  The risk of stroke and other serious problems can be reduced. LET [facility] CARE PROVIDER KNOW ABOUT:   Any allergies you have.  All medicines you are taking, including vitamins, herbs, eye drops, creams, and over-the-counter medicines.  Previous problems you or members of your family have had with the use of anesthetics.   Any blood disorders you have.   Previous surgeries you have had.   Medical conditions you have. RISKS AND COMPLICATIONS  Generally, this is a safe procedure. However, problems can occur and include:   Breathing problems related to the anesthetic used.  A blood clot that breaks free and travels to other parts of your body. This could cause a stroke or other problems. The risk of this is lowered by use of blood-thinning medicine (anticoagulant) prior to the procedure.  Cardiac arrest (rare). BEFORE THE PROCEDURE   You may have tests to detect blood clots in your heart and to evaluate heart function.  You may start taking  anticoagulants so your blood does not clot as easily.   Medicines may be given to help stabilize your heart rate and rhythm. PROCEDURE  You will be given medicine through an IV tube to reduce discomfort and make you sleepy (sedative).   An electrical shock will be delivered. AFTER THE PROCEDURE Your heart rhythm will be watched to make sure it does not change.    This information is not intended to replace advice given to you by your health care provider. Make sure you discuss any questions you have with your health care provider.

## 2015-10-03 NOTE — Progress Notes (Signed)
Electrical Cardioversion Procedure Note LEINA BABE [id number] 04-20-1941  Procedure: Electrical Cardioversion Indications:  Atrial Fibrillation  Procedure Details Consent: Risks of procedure as well as the alternatives and risks of each were explained to the (patient/caregiver).  Consent for procedure obtained. Time Out: Verified patient identification, verified procedure, site/side was marked, verified correct patient position, special equipment/implants available, medications/allergies/relevent history reviewed, required imaging and test results available.  Performed  Patient placed on cardiac monitor, pulse oximetry, supplemental oxygen as necessary.  Sedation given: propofol Pacer pads placed anterior and posterior chest.  Cardioverted 1 time(s).  Cardioverted at 120J.  Evaluation Findings: Post procedure EKG shows: SB, SR, PACs, at times sinus arrhythmia Complications: None Patient did tolerate procedure well.   Charm Barges S 10/03/2015, 9:02 AM

## 2015-10-04 ENCOUNTER — Ambulatory Visit: Payer: Medicare PPO | Admitting: Cardiovascular Disease

## 2015-10-07 ENCOUNTER — Encounter (HOSPITAL_COMMUNITY): Payer: Self-pay | Admitting: Cardiovascular Disease

## 2015-10-29 ENCOUNTER — Encounter: Payer: Self-pay | Admitting: Cardiovascular Disease

## 2015-10-29 ENCOUNTER — Ambulatory Visit (INDEPENDENT_AMBULATORY_CARE_PROVIDER_SITE_OTHER): Payer: Medicare PPO | Admitting: Cardiovascular Disease

## 2015-10-29 VITALS — BP 122/62 | HR 58 | Ht 65.0 in | Wt 115.0 lb

## 2015-10-29 DIAGNOSIS — Z5181 Encounter for therapeutic drug level monitoring: Secondary | ICD-10-CM

## 2015-10-29 DIAGNOSIS — R600 Localized edema: Secondary | ICD-10-CM

## 2015-10-29 DIAGNOSIS — I429 Cardiomyopathy, unspecified: Secondary | ICD-10-CM

## 2015-10-29 DIAGNOSIS — I38 Endocarditis, valve unspecified: Secondary | ICD-10-CM

## 2015-10-29 DIAGNOSIS — Z136 Encounter for screening for cardiovascular disorders: Secondary | ICD-10-CM

## 2015-10-29 DIAGNOSIS — I481 Persistent atrial fibrillation: Secondary | ICD-10-CM | POA: Diagnosis not present

## 2015-10-29 DIAGNOSIS — I4819 Other persistent atrial fibrillation: Secondary | ICD-10-CM

## 2015-10-29 LAB — HEPATIC FUNCTION PANEL
ALBUMIN: 4 g/dL (ref 3.6–5.1)
ALK PHOS: 105 U/L (ref 33–130)
ALT: 23 U/L (ref 6–29)
AST: 32 U/L (ref 10–35)
Bilirubin, Direct: 0.1 mg/dL (ref <=0.2)
Indirect Bilirubin: 0.6 mg/dL (ref 0.2–1.2)
TOTAL PROTEIN: 7 g/dL (ref 6.1–8.1)
Total Bilirubin: 0.7 mg/dL (ref 0.2–1.2)

## 2015-10-29 LAB — BASIC METABOLIC PANEL
BUN: 14 mg/dL (ref 7–25)
CALCIUM: 9.1 mg/dL (ref 8.6–10.4)
CO2: 28 mmol/L (ref 20–31)
Chloride: 103 mmol/L (ref 98–110)
Creat: 0.71 mg/dL (ref 0.60–0.93)
GLUCOSE: 58 mg/dL — AB (ref 65–99)
POTASSIUM: 3.9 mmol/L (ref 3.5–5.3)
SODIUM: 140 mmol/L (ref 135–146)

## 2015-10-29 LAB — TSH: TSH: 1.916 u[IU]/mL (ref 0.350–4.500)

## 2015-10-29 NOTE — Progress Notes (Signed)
Patient ID: Kelly Dougherty, female   DOB: 01-26-1941, 74 y.o.   MRN: [id number]      SUBJECTIVE: The patient presents for follow-up after undergoing elective cardioversion for symptomatic atrial fibrillation. I initiated Multaq afterwards. She says, "I feel like my old self again", and is back to gardening and being very active. Denies leg swelling, has not had to use Lasix, and denies bleeding problems.  Echocardiogram on 05/21/15 demonstrated mildly reduced left ventricular systolic function, LVEF 87-68%, diffuse hypokinesis, mild mitral and aortic regurgitation, moderate left atrial enlargement, mildly reduced right ventricular systolic function, and moderate to severe right atrial dilatation with moderate tricuspid regurgitation.  Soc: Married in 1959. Husband is a retired Marine scientist and studied at [employer]. 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 3 living children (one son is international Biomedical engineer for [employer] in [place]), 1 son committed suicide.   Review of Systems: As per "subjective", otherwise negative.  No Known Allergies  Current Outpatient Prescriptions  Medication Sig Dispense Refill  . apixaban (ELIQUIS) 5 MG TABS tablet Take 1 tablet (5 mg total) by mouth 2 (two) times daily. 60 tablet 6  . dronedarone (MULTAQ) 400 MG tablet Take 1 tablet (400 mg total) by mouth 2 (two) times daily with a meal. 60 tablet 11  . furosemide (LASIX) 20 MG tablet Take 40 mg for 3 days starting today 08/06/15, and the take 20 mg daily prn as needed for swelling (Patient taking differently: Take 20 mg by mouth daily. ) 90 tablet 3  . latanoprost (XALATAN) 0.005 % ophthalmic solution Place 1 drop into both eyes at bedtime.     . potassium chloride (K-DUR) 10 MEQ tablet Take 20 meq (2 tabs) starting today 08/06/15 for the next 3 days and then take 10 meq only on days you taking Lasix for swelling (Patient taking differently: Take 10 mEq by mouth daily. ) 90 tablet  3  . timolol (TIMOPTIC) 0.5 % ophthalmic solution Place 1 drop into the left eye 2 (two) times daily.     No current facility-administered medications for this visit.    Past Medical History  Diagnosis Date  . Glaucoma   . Dysrhythmia     04/2015 with atrial fibrillation    Past Surgical History  Procedure Laterality Date  . Eye surgery      left  . Hemorroidectomy    . Tonsillectomy      childhood  . Appendectomy      74 years of age  . Cardioversion N/A 10/03/2015    Procedure: CARDIOVERSION;  Surgeon: Herminio Commons, MD;  Location: [facility];  Service: Cardiovascular;  Laterality: N/A;    Social History   Social History  . Marital Status: Married    Spouse Name: N/A  . Number of Children: N/A  . Years of Education: N/A   Occupational History  . Not on file.   Social History Main Topics  . Smoking status: Never Smoker   . Smokeless tobacco: Not on file  . Alcohol Use: 0.0 oz/week    0 Standard drinks or equivalent per week     Comment: occasional  . Drug Use: No  . Sexual Activity: Not on file   Other Topics Concern  . Not on file   Social History Narrative     Filed Vitals:   10/29/15 0914  BP: 122/62  Pulse: 58  Height: 5\' 5"  (1.651 m)  Weight: 115 lb (52.164 kg)  SpO2: 97%    PHYSICAL EXAM General: NAD HEENT: Normal. Neck: No JVD, no thyromegaly. Lungs: Clear to auscultation bilaterally with normal respiratory effort. CV: Nondisplaced PMI.  Regular rate and rhythm, normal S1/S2, no S3/S4, no murmur. No pretibial or periankle edema.  No carotid bruit.  Normal pedal pulses.  Abdomen: Soft, nontender, no hepatosplenomegaly, no distention.  Neurologic: Alert and oriented x 3.  Psych: Normal affect. Skin: Normal. Musculoskeletal: Normal range of motion, no gross deformities. Extremities: No clubbing or cyanosis.   ECG: Most recent ECG reviewed.      ASSESSMENT AND PLAN: 1. Atrial fibrillation s/p cardioversion: Doing very well on  Multaq. Will obtain ECG to check QTc interval. Will check BMET, LFT's, TSH. Continue apixaban. I told her should medical therapy fail, consideration would be given for ablation.  2. Cardiomyopathy, LVEF 45-50%: Probably tachycardia-mediated. Will reassess at next appt as she is now in sinus rhythm.  3. Valvular heart disease: Will monitor with periodic echocardiograms.  4. Bilateral leg/ankle/feet swelling: Resolved after restoration of sinus rhythm. Continue Lasix 20 mg prn with KCl.  Dispo: f/u 6 months.  Kate Sable, M.D., F.A.C.C.  ADDENDUM: ECG today demonstrates normal sinus rhythm, heart rate 60 bpm, RSR prime pattern in leads V1 and V2, QTC 432 ms.

## 2015-10-29 NOTE — Patient Instructions (Signed)
Your physician wants you to follow-up in: 6 months with Dr Koneswaran You will receive a reminder letter in the mail two months in advance. If you don't receive a letter, please call our office to schedule the follow-up appointment.    Your physician recommends that you continue on your current medications as directed. Please refer to the Current Medication list given to you today.      If you need a refill on your cardiac medications before your next appointment, please call your pharmacy.      Thank you for choosing [facility] !        

## 2015-10-30 ENCOUNTER — Telehealth: Payer: Self-pay

## 2015-10-30 NOTE — Telephone Encounter (Signed)
-----   Message from Herminio Commons, MD sent at 10/30/2015  9:40 AM EDT ----- Normal. Please inform pt.

## 2015-10-30 NOTE — Telephone Encounter (Signed)
Spoke to pt to inform her of lab results. She voiced understanding.

## 2015-11-27 ENCOUNTER — Encounter: Payer: Self-pay | Admitting: Family Medicine

## 2015-11-27 DIAGNOSIS — H409 Unspecified glaucoma: Secondary | ICD-10-CM | POA: Insufficient documentation

## 2015-12-10 ENCOUNTER — Ambulatory Visit (INDEPENDENT_AMBULATORY_CARE_PROVIDER_SITE_OTHER): Payer: Medicare PPO | Admitting: Family Medicine

## 2015-12-10 ENCOUNTER — Encounter: Payer: Self-pay | Admitting: Family Medicine

## 2015-12-10 VITALS — BP 126/62 | HR 70 | Temp 98.0°F | Resp 14 | Ht 65.0 in | Wt 116.0 lb

## 2015-12-10 DIAGNOSIS — Z23 Encounter for immunization: Secondary | ICD-10-CM

## 2015-12-10 DIAGNOSIS — I4891 Unspecified atrial fibrillation: Secondary | ICD-10-CM | POA: Diagnosis not present

## 2015-12-10 DIAGNOSIS — Z0000 Encounter for general adult medical examination without abnormal findings: Secondary | ICD-10-CM | POA: Diagnosis not present

## 2015-12-10 DIAGNOSIS — H409 Unspecified glaucoma: Secondary | ICD-10-CM

## 2015-12-10 DIAGNOSIS — G5761 Lesion of plantar nerve, right lower limb: Secondary | ICD-10-CM | POA: Diagnosis not present

## 2015-12-10 DIAGNOSIS — Z13820 Encounter for screening for osteoporosis: Secondary | ICD-10-CM

## 2015-12-10 DIAGNOSIS — I429 Cardiomyopathy, unspecified: Secondary | ICD-10-CM | POA: Diagnosis not present

## 2015-12-10 LAB — LIPID PANEL
CHOL/HDL RATIO: 2.8 ratio (ref <=5.0)
CHOLESTEROL: 221 mg/dL — AB (ref 125–200)
HDL: 80 mg/dL (ref >=46)
LDL Cholesterol: 126 mg/dL (ref <130)
Triglycerides: 74 mg/dL (ref <150)
VLDL: 15 mg/dL (ref <30)

## 2015-12-10 NOTE — Assessment & Plan Note (Signed)
Currently compensated uses Lasix as needed which is very rare

## 2015-12-10 NOTE — Patient Instructions (Addendum)
Referral to podiatry Bone Density to be done Release of records- [place] 13 given  We will call with lab results  F/U 6 months

## 2015-12-10 NOTE — Progress Notes (Deleted)
Patient ID: Kelly Dougherty, female   DOB: July 30, 1941, 74 y.o.   MRN: [id number]   Subjective:    Patient ID: Kelly Dougherty, female    DOB: 01-08-1941, 74 y.o.   MRN: [id number]  Patient presents for New Patient CPE  Pt here to establus chare     Review Of Systems:  GEN- denies fatigue, fever, weight loss,weakness, recent illness HEENT- denies eye drainage, change in vision, nasal discharge, CVS- denies chest pain, palpitations RESP- denies SOB, cough, wheeze ABD- denies N/V, change in stools, abd pain GU- denies dysuria, hematuria, dribbling, incontinence MSK- denies joint pain, muscle aches, injury Neuro- denies headache, dizziness, syncope, seizure activity       Objective:    There were no vitals taken for this visit. GEN- NAD, alert and oriented x3 HEENT- PERRL, EOMI, non injected sclera, pink conjunctiva, MMM, oropharynx clear Neck- Supple, no thyromegaly CVS- RRR, no murmur RESP-CTAB ABD-NABS,soft,NT,ND EXT- No edema Pulses- Radial, DP- 2+        Assessment & Plan:      Problem List Items Addressed This Visit    None      Note: This dictation was prepared with Dragon dictation along with smaller phrase technology. Any transcriptional errors that result from this process are unintentional.

## 2015-12-10 NOTE — Assessment & Plan Note (Signed)
[facility] note. She is in sinus rhythm she is on blood thinners

## 2015-12-10 NOTE — Assessment & Plan Note (Signed)
Refer to podiatry

## 2015-12-10 NOTE — Assessment & Plan Note (Signed)
followed by ophthalmology

## 2015-12-10 NOTE — Progress Notes (Signed)
Subjective:   Patient presents for Medicare Annual/Subsequent preventive examination.  She here to establish care. Last seen by [facility]. She has been fairly healthy her entire life. Back in May she was diagnosed with atrial fibrillation as well as cardiomyopathy. She had cardioversion done recentlyand she is now back in sinus rhythm and is on blood thinners. She denies any chest pain or shortness of breath feels like she can do her regular activities. She has no other significant past medical history.Her only concern today is pain between her third and fourth digits on her right foot this has been going on for over a year she has pain when she steps down at that area and sometimes she will have shooting pains from that area. She also has numbness in the third toe.  Married- had 4 children, Husband living- retired professor   Review Past Medical/Family/Social: Per EMR   Risk Factors  Current exercise habits: walks Dietary issues discussed: None  Cardiac risk factors: Afib  Depression Screen  (Note: if answer to either of the following is "Yes", a more complete depression screening is indicated)  Over the past two weeks, have you felt down, depressed or hopeless? No Over the past two weeks, have you felt little interest or pleasure in doing things? No Have you lost interest or pleasure in daily life? No Do you often feel hopeless? No Do you cry easily over simple problems? No   Activities of Daily Living  In your present state of health, do you have any difficulty performing the following activities?:  Driving? No  Managing money? No  Feeding yourself? No  Getting from bed to chair? No  Climbing a flight of stairs? No  Preparing food and eating?: No  Bathing or showering? No  Getting dressed: No  Getting to the toilet? No  Using the toilet:No  Moving around from place to place: No  In the past year have you fallen or had a near fall?:No  Are you sexually  active? No  Do you have more than one partner? No   Hearing Difficulties: No  Do you often ask people to speak up or repeat themselves? No  Do you experience ringing or noises in your ears? No Do you have difficulty understanding soft or whispered voices? No  Do you feel that you have a problem with memory? No Do you often misplace items? No  Do you feel safe at home? Yes  Cognitive Testing  Alert? Yes Normal Appearance?Yes  Oriented to person? Yes Place? Yes  Time? Yes  Recall of three objects? Yes  Can perform simple calculations? Yes  Displays appropriate judgment?Yes  Can read the correct time from a watch face?Yes   List the Names of Other Physician/Practitioners you currently use: Cardiology, [facility]   Screening Tests / Date Colonoscopy    - has had in past, Cologaurd in Jan 2016                  Zostavax - history of disease Mammogram UTD Influenza Vaccine UTD Tetanus/tdap UTD Pneumonia- Due for Prevnar 13  ROS:  GEN- denies fatigue, fever, weight loss,weakness, recent illness HEENT- denies eye drainage, change in vision, nasal discharge, CVS- denies chest pain, palpitations RESP- denies SOB, cough, wheeze ABD- denies N/V, change in stools, abd pain GU- denies dysuria, hematuria, dribbling, incontinence MSK- denies joint pain, muscle aches, injury Neuro- denies headache, dizziness, syncope, seizure activity   PHYSICAL GEN- NAD, alert and oriented x3 HEENT- PERRL,  EOMI, non injected sclera, pink conjunctiva, MMM, oropharynx clear Neck- Supple, no thryomegaly, no bruit CVS- RRR, no murmur RESP-CTAB ABD-NABS,soft, NT,ND Psych- normal affect and mood  EXT- No edema Pulses- Radial, DP- 2+       Assessment:    Annual wellness medicare exam   Plan:    During the course of the visit the patient was educated and counseled about appropriate screening and preventive services including:  Prevnar 13 given  Schedule for bONE density  Screen  NEg for depression.  Diet review for nutrition referral? Yes ____ Not Indicated __x__  Patient Instructions (the written plan) was given to the patient.  Medicare Attestation  I have personally reviewed:  The patient's medical and social history  Their use of alcohol, tobacco or illicit drugs  Their current medications and supplements  The patient's functional ability including ADLs,fall risks, home safety risks, cognitive, and hearing and visual impairment  Diet and physical activities  Evidence for depression or mood disorders  The patient's weight, height, BMI, and visual acuity have been recorded in the chart. I have made referrals, counseling, and provided education to the patient based on review of the above and I have provided the patient with a written personalized care plan for preventive services.

## 2015-12-16 ENCOUNTER — Other Ambulatory Visit: Payer: Self-pay | Admitting: Cardiovascular Disease

## 2015-12-25 ENCOUNTER — Ambulatory Visit (HOSPITAL_COMMUNITY)
Admission: RE | Admit: 2015-12-25 | Discharge: 2015-12-25 | Disposition: A | Discharge status: Home / Self Care | Payer: Medicare PPO | Source: Ambulatory Visit | Attending: Family Medicine | Admitting: Family Medicine

## 2015-12-25 DIAGNOSIS — Z13820 Encounter for screening for osteoporosis: Secondary | ICD-10-CM | POA: Diagnosis not present

## 2015-12-25 DIAGNOSIS — M810 Age-related osteoporosis without current pathological fracture: Secondary | ICD-10-CM | POA: Diagnosis not present

## 2015-12-25 DIAGNOSIS — Z780 Asymptomatic menopausal state: Secondary | ICD-10-CM | POA: Diagnosis not present

## 2015-12-26 ENCOUNTER — Encounter: Payer: Self-pay | Admitting: *Deleted

## 2016-01-08 ENCOUNTER — Ambulatory Visit (INDEPENDENT_AMBULATORY_CARE_PROVIDER_SITE_OTHER): Payer: Medicare Other | Admitting: Podiatry

## 2016-01-08 ENCOUNTER — Encounter: Payer: Self-pay | Admitting: Podiatry

## 2016-01-08 ENCOUNTER — Ambulatory Visit (INDEPENDENT_AMBULATORY_CARE_PROVIDER_SITE_OTHER): Payer: Medicare Other

## 2016-01-08 VITALS — BP 99/60 | HR 52 | Resp 12

## 2016-01-08 DIAGNOSIS — G5761 Lesion of plantar nerve, right lower limb: Secondary | ICD-10-CM | POA: Diagnosis not present

## 2016-01-08 DIAGNOSIS — R52 Pain, unspecified: Secondary | ICD-10-CM

## 2016-01-08 MED ORDER — TRIAMCINOLONE ACETONIDE 40 MG/ML IJ SUSP
20.0000 mg | Freq: Once | INTRAMUSCULAR | Status: AC
  Administered 2016-01-08: 20 mg

## 2016-01-08 NOTE — Patient Instructions (Signed)
  Okay to purchase an over-the-counter soft arch support with a metatarsal raise, which is a raised bump at the bases second-fourth toes   Morton Neuralgia Morton neuralgia is a type of foot pain in the area closest to your toes. This area is sometimes called the ball of your foot. Morton neuralgia occurs when a branch of a nerve in your foot (digital nerve) becomes compressed.  When this happens over a long period of time, the nerve can thicken (neuroma) and cause pain. This usually occurs between the third and fourth toe. Morton neuralgia can come and go but may get worse over time.  CAUSES Your digital nerve can become compressed and stretched at a point where it passes under a thick band of tissue that connects your toes (intermetatarsal ligament). Morton neuralgia can be caused by mild repetitive damage in this area. This type of damage can result from:   Activities such as running or jumping.  Wearing shoes that are too tight. RISK FACTORS You may be at risk for Morton neuralgia if you:  Are female.  Wear high heels.  Wear shoes that are narrow or tight.  Participate in activities that stretch your toes. These include:  Running.  Ballet.  Long-distance walking. SIGNS AND SYMPTOMS The first symptom of Morton neuralgia is pain that spreads from the ball of your foot to your toes. It may feel like you are walking on a marble. Pain usually gets worse with walking and goes away at night. Other symptoms may include numbness and cramping of your toes. DIAGNOSIS  Your health care provider will do a physical exam. When doing the exam, your health care provider may:   Squeeze your foot just behind your toe.  Ask you to move your toes to check for pain. You may also have tests on your foot to confirm the diagnosis. These may include:   An X-ray.  An MRI. TREATMENT  Treatment for Morton neuralgia may be as simple as changing the kind of shoes you wear. Other treatments may  include:  Wearing a supportive pad (orthosis) under the front of your foot. This lifts your toe bones and takes pressure off the nerve.  Getting injections of numbing medicine and anti-inflammatory medicine (steroid) in the nerve.  Having surgery to remove part of the thickened nerve. HOME CARE INSTRUCTIONS   Take medicine only as directed by your health care provider.  Wear soft-soled shoes with a wide toe area.  Stop activities that may be causing pain.  Elevate your foot when resting.  Massage your foot.  Apply ice to the injured area:   Put ice in a plastic bag.  Place a towel between your skin and the bag.  Leave the ice on for 20 minutes, 2-3 times a day.   Keep all follow-up visits as directed by your health care provider. This is important. SEEK MEDICAL CARE IF:  Home care instructions are not helping you get better.  Your symptoms change or get worse.   This information is not intended to replace advice given to you by your health care provider. Make sure you discuss any questions you have with your health care provider.   Document Released: 03/22/2001 Document Revised: 01/04/2015 Document Reviewed: 02/14/2014 Elsevier Interactive Patient Education [employer].

## 2016-01-08 NOTE — Progress Notes (Signed)
   Subjective:    Patient ID: Kelly Dougherty, female    DOB: 1941-12-18, 75 y.o.   MRN: [id number]  HPI   This patient presents today with approximately two-year history of intermittent sharp painful burning sensations with some numbness in or around the second and third toes on the right foot. The symptoms are aggravated with standing and walking and relieved with rest and removal shoes. The symptoms have gradually become more frequent and more uncomfortable over the last several months. Patient has attempted multiple shoe changes as well as soaking without any change in symptoms.  Review of Systems  Neurological: Positive for numbness.  All other systems reviewed and are negative.      Objective:   Physical Exam  Orientated 3  Vascular: No peripheral edema bilaterally DP and PT pulses 2/4 bilaterally Capillary reflex delayed bilaterally  Neurological: Sensation to 10 g monofilament wire intact 5/5 bilaterally Vibratory sensation nonreactive right reactive left Ankle reflex equal and reactive bilaterally Mudler sign third right intermetatarsal space Exquisite tenderness second right intermetatarsal space without any palpable lesions  Dermatological: Atrophic skin without any hair growth No skin lesions bilaterally  Musculoskeletal: Stable gait There is no pain or crepitus on range of motion ankle, subtalar, midtarsal joints bilaterally  X-ray examination weightbearing right foot dated 01/08/2016  Intact bony structure without fracture and/or dislocation Decreased bone density noted throughout all 3 views Joint spaces appear adequate Bunionette  Radiographic impression: No acute bony abnormality noted in the right foot     Assessment & Plan:   Assessment: 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 I offered patient dexamethasone injection into both sites and she verbally consents  Skin is prepped with alcohol and Betadine and 2 mg of dexamethasone phosphate and 10 mg of plain Xylocaine were injected into the second and third right intermetatarsal spaces for Kenalog along the injections #1. Patient tolerated procedure without any difficulty. No bruising or swelling noted at the injection site  Advised patient to persist soft insert with metatarsal raise  Reappoint 2 weeks

## 2016-01-22 ENCOUNTER — Ambulatory Visit (INDEPENDENT_AMBULATORY_CARE_PROVIDER_SITE_OTHER): Payer: Medicare Other | Admitting: Podiatry

## 2016-01-22 ENCOUNTER — Encounter: Payer: Self-pay | Admitting: Podiatry

## 2016-01-22 VITALS — BP 115/74 | HR 86 | Resp 12

## 2016-01-22 DIAGNOSIS — G5761 Lesion of plantar nerve, right lower limb: Secondary | ICD-10-CM

## 2016-01-22 NOTE — Progress Notes (Signed)
   Subjective:    Patient ID: Kelly Dougherty, female    DOB: April 16, 1941, 75 y.o.   MRN: [id number]  HPI  This patient presents today for follow-up evaluation for response to dexamethasone phosphate injections into the second third intermetatarsal spaces on the right foot on the visit of 01/08/2016. Patient describes 2-3 days postinjection totally asymptomatic. Now patient has some reduction of burning and discomfort in these areas which still complains of shooting and burning veins upon standing and walking. She denies any reaction to the injections on the visit of 01/08/2016  Review of Systems  All other systems reviewed and are negative.      Objective:   Physical Exam  Orientated 3 DP and PT pulses 2/4 bilaterally No peripheral edema noted bilaterally  Dermatological: No skin lesions noted bilaterally  Musculoskeletal: Palpable tenderness second and third right intermetatarsal spaces without any palpable lesions      Assessment & Plan:   Assessment: Morton's neuroma third right intermetatarsal space neuroma second right intermetatarsal space  Plan: Today I offered patient 1 further dexamethasone injection and she had a positive response and she verbally consents  Skin is prepped with alcohol and Betadine and 2 mg of dexamethasone phosphate and 10 mg of plain Xylocaine were injected into each second and third right intermetatarsal spaces for dexamethasone injection #2. Patient tolerated both injections without any difficulty. Patient will continue wearing shoes with insoles that had metatarsal raise  If neuroma-like symptoms persist at next evaluation will consider offering patient alcohol injections Reappoint 4 weeks

## 2016-01-22 NOTE — Patient Instructions (Signed)
Morton Neuralgia  Morton neuralgia is a type of foot pain in the area closest to your toes. This area is sometimes called the ball of your foot. Morton neuralgia occurs when a branch of a nerve in your foot (digital nerve) becomes compressed.   When this happens over a long period of time, the nerve can thicken (neuroma) and cause pain. This usually occurs between the third and fourth toe. Morton neuralgia can come and go but may get worse over time.   CAUSES  Your digital nerve can become compressed and stretched at a point where it passes under a thick band of tissue that connects your toes (intermetatarsal ligament). Morton neuralgia can be caused by mild repetitive damage in this area. This type of damage can result from:   · Activities such as running or jumping.  · Wearing shoes that are too tight.  RISK FACTORS  You may be at risk for Morton neuralgia if you:  · Are female.  · Wear high heels.  · Wear shoes that are narrow or tight.  · Participate in activities that stretch your toes. These include:  ¨ Running.  ¨ Ballet.  ¨ Long-distance walking.  SIGNS AND SYMPTOMS  The first symptom of Morton neuralgia is pain that spreads from the ball of your foot to your toes. It may feel like you are walking on a marble. Pain usually gets worse with walking and goes away at night. Other symptoms may include numbness and cramping of your toes.  DIAGNOSIS   Your health care provider will do a physical exam. When doing the exam, your health care provider may:   · Squeeze your foot just behind your toe.  · Ask you to move your toes to check for pain.  You may also have tests on your foot to confirm the diagnosis. These may include:   · An X-ray.  · An MRI.  TREATMENT   Treatment for Morton neuralgia may be as simple as changing the kind of shoes you wear. Other treatments may include:  · Wearing a supportive pad (orthosis) under the front of your foot. This lifts your toe bones and takes pressure off the nerve.  · Getting  injections of numbing medicine and anti-inflammatory medicine (steroid) in the nerve.  · Having surgery to remove part of the thickened nerve.  HOME CARE INSTRUCTIONS   · Take medicine only as directed by your health care provider.  · Wear soft-soled shoes with a wide toe area.  · Stop activities that may be causing pain.  · Elevate your foot when resting.  · Massage your foot.  · Apply ice to the injured area:      Put ice in a plastic bag.    Place a towel between your skin and the bag.    Leave the ice on for 20 minutes, 2-3 times a day.    · Keep all follow-up visits as directed by your health care provider. This is important.  SEEK MEDICAL CARE IF:  · Home care instructions are not helping you get better.  · Your symptoms change or get worse.     This information is not intended to replace advice given to you by your health care provider. Make sure you discuss any questions you have with your health care provider.     Document Released: 03/22/2001 Document Revised: 01/04/2015 Document Reviewed: 02/14/2014  Elsevier Interactive Patient Education ©2016 Elsevier Inc.

## 2016-01-23 DIAGNOSIS — G5761 Lesion of plantar nerve, right lower limb: Secondary | ICD-10-CM

## 2016-01-23 MED ORDER — DEXAMETHASONE SODIUM PHOSPHATE 120 MG/30ML IJ SOLN
4.0000 mg | Freq: Once | INTRAMUSCULAR | Status: AC
  Administered 2016-01-23: 4 mg via INTRA_ARTICULAR

## 2016-02-19 ENCOUNTER — Ambulatory Visit: Payer: Medicare Other | Admitting: Podiatry

## 2016-02-21 ENCOUNTER — Telehealth: Payer: Self-pay | Admitting: *Deleted

## 2016-02-21 NOTE — Telephone Encounter (Signed)
Prior auth complete and approved through 12/27/16. [id number]

## 2016-04-15 ENCOUNTER — Other Ambulatory Visit: Payer: Self-pay | Admitting: Cardiovascular Disease

## 2016-04-23 IMAGING — CR DG CHEST 1V PORT
1 series · 1 of 1 positions shown · non-contrast
Comparison: None. Patient's prior chest x-ray from 5005 is not
available for comparison.

CLINICAL DATA: Atrial flutter since last [REDACTED]. Shortness of
breath.

EXAM:
PORTABLE CHEST - 1 VIEW

[ap portable]
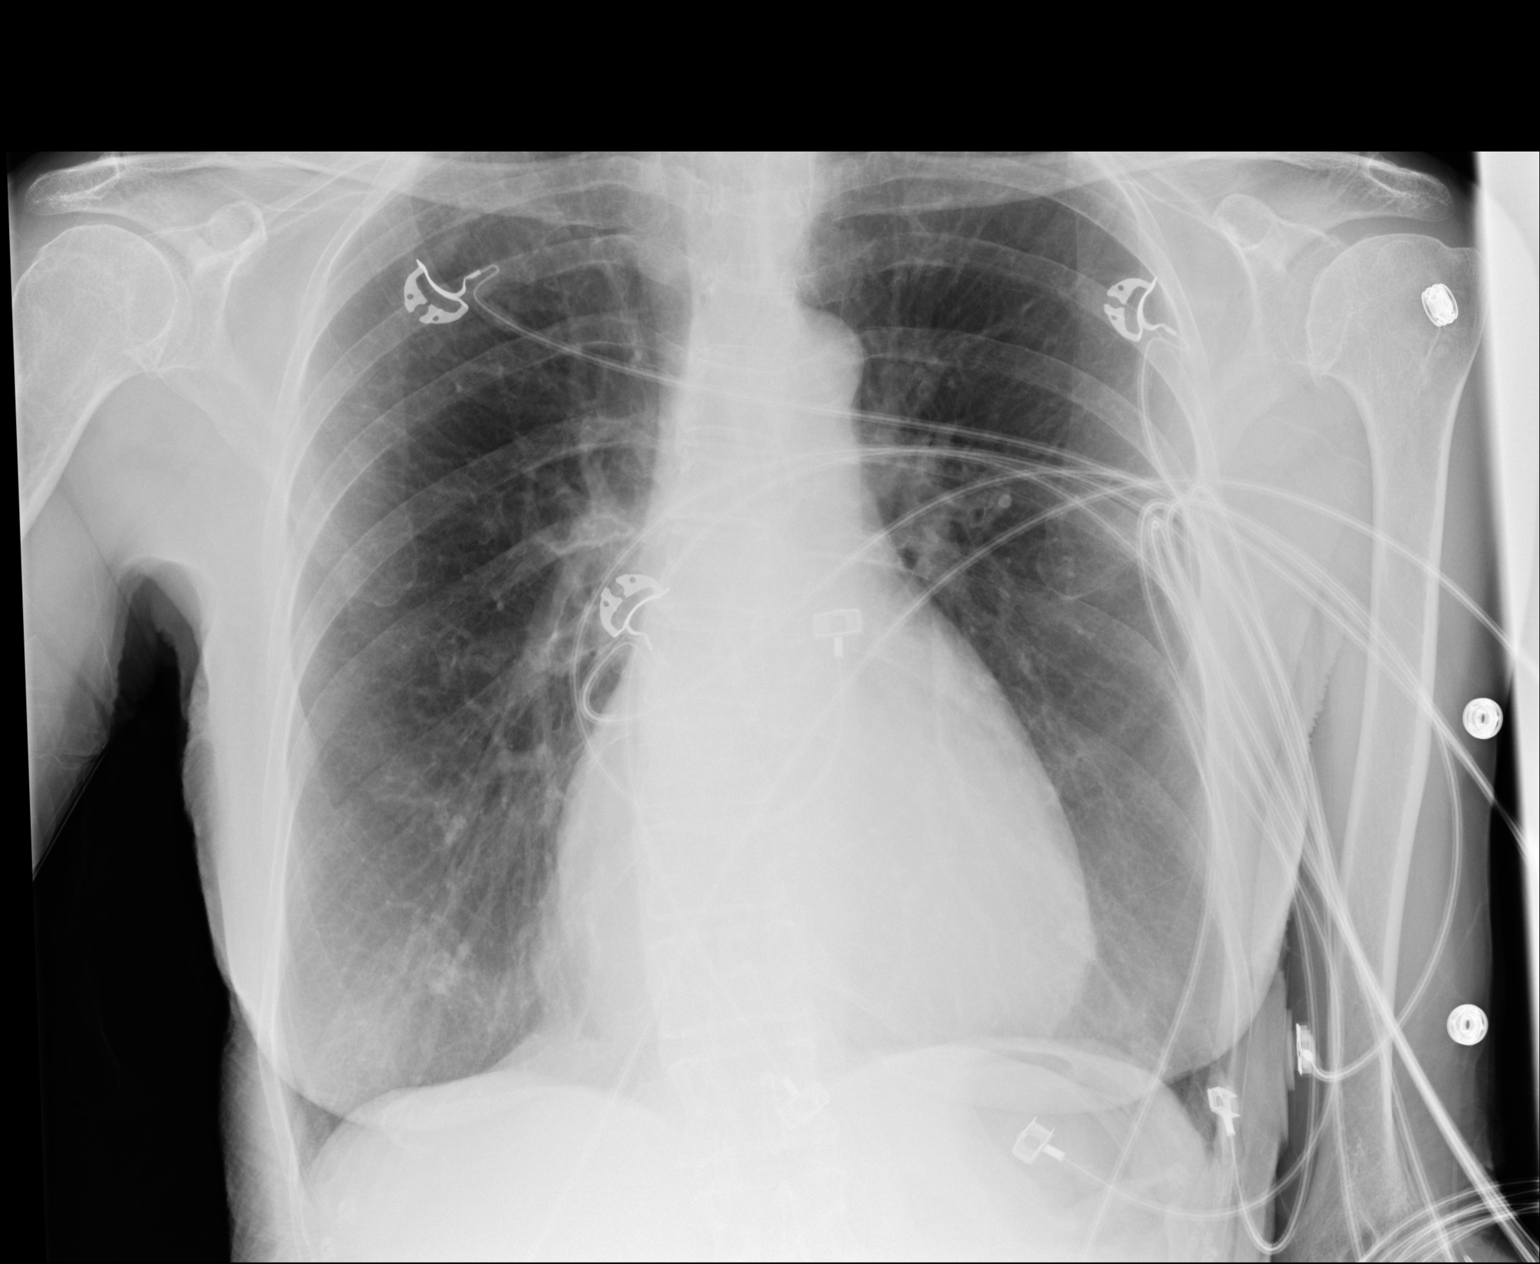

[1 of 1 positions shown; findings below may reference images not displayed]

FINDINGS: The mediastinal contour is normal. The heart size is upper limits
are normal. The lungs are hyperinflated. Both lungs are clear. The
visualized skeletal structures are unremarkable.
IMPRESSION: No active cardiopulmonary disease.  Emphysema.

## 2016-04-27 ENCOUNTER — Ambulatory Visit (INDEPENDENT_AMBULATORY_CARE_PROVIDER_SITE_OTHER): Payer: Medicare Other | Admitting: Cardiovascular Disease

## 2016-04-27 ENCOUNTER — Encounter: Payer: Self-pay | Admitting: Cardiovascular Disease

## 2016-04-27 VITALS — BP 118/66 | HR 75 | Wt 112.0 lb

## 2016-04-27 DIAGNOSIS — R600 Localized edema: Secondary | ICD-10-CM

## 2016-04-27 DIAGNOSIS — I38 Endocarditis, valve unspecified: Secondary | ICD-10-CM | POA: Diagnosis not present

## 2016-04-27 DIAGNOSIS — I481 Persistent atrial fibrillation: Secondary | ICD-10-CM | POA: Diagnosis not present

## 2016-04-27 DIAGNOSIS — I429 Cardiomyopathy, unspecified: Secondary | ICD-10-CM | POA: Diagnosis not present

## 2016-04-27 DIAGNOSIS — I4819 Other persistent atrial fibrillation: Secondary | ICD-10-CM

## 2016-04-27 NOTE — Patient Instructions (Signed)
Your physician wants you to follow-up in: 1 year You will receive a reminder letter in the mail two months in advance. If you don't receive a letter, please call our office to schedule the follow-up appointment.     Your physician recommends that you continue on your current medications as directed. Please refer to the Current Medication list given to you today.     Your physician has requested that you have an echocardiogram. Echocardiography is a painless test that uses sound waves to create images of your heart. It provides your doctor with information about the size and shape of your heart and how well your heart's chambers and valves are working. This procedure takes approximately one hour. There are no restrictions for this procedure.      Thank you for choosing [facility] !        

## 2016-04-27 NOTE — Progress Notes (Signed)
Patient ID: [name], female   DOB: [birth year], 75 y.o.   MRN: [id number]      SUBJECTIVE: The patient presents for follow-up of atrial fibrillation and cardiomyopathy. Echocardiogram on 05/21/15 demonstrated mildly reduced left ventricular systolic function, LVEF [id number], diffuse hypokinesis, mild mitral and aortic regurgitation, moderate left atrial enlargement, mildly reduced right ventricular systolic function, and moderate to severe right atrial dilatation with moderate tricuspid regurgitation.  She says "I am doing great" and is very grateful. Denies shortness of breath, palpitations, and leg swelling.   Soc: Married in 1959. Husband is a retired Marine scientist and studied at [employer]. 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 3 living children (one son is international Biomedical engineer for [employer] in [place]), 1 son committed suicide.  Review of Systems: As per "subjective", otherwise negative.  No Known Allergies  Current Outpatient Prescriptions  Medication Sig Dispense Refill  . calcium-vitamin D (OSCAL WITH D) 500-200 MG-UNIT tablet Take 1 tablet by mouth.    [name] Calcium (STOOL SOFTENER PO) Take by mouth.    . dronedarone (MULTAQ) 400 MG tablet Take 1 tablet (400 mg total) by mouth 2 (two) times daily with a meal. 60 tablet 11  . ELIQUIS 5 MG TABS tablet TAKE ONE TABLET BY MOUTH TWICE DAILY 60 tablet 6  . furosemide (LASIX) 20 MG tablet Take 40 mg for 3 days starting today 08/06/15, and the take 20 mg daily prn as needed for swelling 90 tablet 3  . latanoprost (XALATAN) 0.005 % ophthalmic solution Place 1 drop into both eyes at bedtime.     . potassium chloride (K-DUR) 10 MEQ tablet Take 20 meq (2 tabs) starting today 08/06/15 for the next 3 days and then take 10 meq only on days you taking Lasix for swelling 90 tablet 3  . Psyllium (METAMUCIL PO) Take by mouth.    . timolol (TIMOPTIC) 0.5 % ophthalmic solution Place 1 drop into the left eye  2 (two) times daily.     No current facility-administered medications for this visit.    Past Medical History  Diagnosis Date  . Dysrhythmia     04/2015 with atrial fibrillation  . Cataract   . Glaucoma   . Atrial fibrillation ([place])   . Arthritis     Past Surgical History  Procedure Laterality Date  . Eye surgery      left  . Hemorroidectomy    . Tonsillectomy      childhood  . Cardioversion N/A 10/03/2015    Procedure: CARDIOVERSION;  Surgeon: Herminio Commons, MD;  Location: [facility];  Service: Cardiovascular;  Laterality: N/A;  . Appendectomy      75 years of age    Social History   Social History  . Marital Status: Married    Spouse Name: N/A  . Number of Children: N/A  . Years of Education: N/A   Occupational History  . Not on file.   Social History Main Topics  . Smoking status: Never Smoker   . Smokeless tobacco: Never Used  . Alcohol Use: 4.2 oz/week    7 Glasses of wine, 0 Standard drinks or equivalent per week     Comment: occasional  . Drug Use: No  . Sexual Activity: Not Currently   Other Topics Concern  . Not on file   Social History Narrative     Filed Vitals:   04/27/16 1028  BP: 118/66  Pulse: 75  Weight: 112  lb (50.803 kg)  SpO2: 94%    PHYSICAL EXAM General: NAD HEENT: Normal. Neck: No JVD, no thyromegaly. Lungs: Clear to auscultation bilaterally with normal respiratory effort. CV: Nondisplaced PMI.  Regular rate and rhythm, normal S1/S2, no S3/S4, no murmur. No pretibial or periankle edema.   Abdomen: Soft, nontender, no distention.  Neurologic: Alert and oriented.  Psych: Normal affect. Skin: Normal. Musculoskeletal: No gross deformities.  ECG: Most recent ECG reviewed.      ASSESSMENT AND PLAN: 1. Atrial fibrillation: Maintaining sinus rhythm. Underwent successful cardioversion in 2016. Doing very well on Multaq. Continue apixaban. I told her in the past that should medical therapy fail, consideration would be  given for ablation.  2. Cardiomyopathy, LVEF 45-50%: Probably tachycardia-mediated. Will reassess as she has maintained sinus rhythm.  3. Valvular heart disease: Will reassess with echocardiogram.  4. Bilateral leg/ankle/feet swelling: Resolved after restoration of sinus rhythm. Continue Lasix 20 mg prn with KCl. No recent use.  Dispo: f/u 1 year.   Kate Sable, M.D., F.A.C.C.

## 2016-05-01 ENCOUNTER — Ambulatory Visit (HOSPITAL_COMMUNITY)
Admission: RE | Admit: 2016-05-01 | Discharge: 2016-05-01 | Disposition: A | Discharge status: Home / Self Care | Payer: Medicare Other | Source: Ambulatory Visit | Attending: Cardiovascular Disease | Admitting: Cardiovascular Disease

## 2016-05-01 DIAGNOSIS — I429 Cardiomyopathy, unspecified: Secondary | ICD-10-CM | POA: Diagnosis not present

## 2016-05-01 DIAGNOSIS — I38 Endocarditis, valve unspecified: Secondary | ICD-10-CM | POA: Diagnosis not present

## 2016-05-01 DIAGNOSIS — I071 Rheumatic tricuspid insufficiency: Secondary | ICD-10-CM | POA: Insufficient documentation

## 2016-05-01 DIAGNOSIS — I517 Cardiomegaly: Secondary | ICD-10-CM | POA: Insufficient documentation

## 2016-05-01 DIAGNOSIS — I340 Nonrheumatic mitral (valve) insufficiency: Secondary | ICD-10-CM | POA: Diagnosis not present

## 2016-05-01 DIAGNOSIS — I351 Nonrheumatic aortic (valve) insufficiency: Secondary | ICD-10-CM | POA: Diagnosis not present

## 2016-06-10 ENCOUNTER — Ambulatory Visit (INDEPENDENT_AMBULATORY_CARE_PROVIDER_SITE_OTHER): Payer: Medicare Other | Admitting: Family Medicine

## 2016-06-10 ENCOUNTER — Encounter: Payer: Self-pay | Admitting: Family Medicine

## 2016-06-10 DIAGNOSIS — R634 Abnormal weight loss: Secondary | ICD-10-CM

## 2016-06-10 DIAGNOSIS — R1030 Lower abdominal pain, unspecified: Secondary | ICD-10-CM | POA: Diagnosis not present

## 2016-06-10 DIAGNOSIS — I482 Chronic atrial fibrillation, unspecified: Secondary | ICD-10-CM

## 2016-06-10 LAB — URINALYSIS, ROUTINE W REFLEX MICROSCOPIC
BILIRUBIN URINE: NEGATIVE
GLUCOSE, UA: NEGATIVE
HGB URINE DIPSTICK: NEGATIVE
KETONES UR: NEGATIVE
LEUKOCYTES UA: NEGATIVE
Nitrite: NEGATIVE
PH: 7 (ref 5.0–8.0)
PROTEIN: NEGATIVE
Specific Gravity, Urine: 1.01 (ref 1.001–1.035)

## 2016-06-10 NOTE — Progress Notes (Signed)
Patient ID: Kelly Dougherty, female   DOB: [birth year], 75 y.o.   MRN: [id number]   Subjective:    Patient ID: Kelly Dougherty, female    DOB: May 08, 1941, 75 y.o.   MRN: [id number]  Patient presents for 6 month F/U and Lower Abd Pressure   Pt here to f/u chronic medical problems, followed by cardiology for her A fib and cardiomyopathy, has not had any cardiac issues recently. She is taking meds as prescribed. Reviewed last cardiology note from May no changes made    Abd pressure- lower abdominal pain worsened over past month, her weight is down 5lbs since our last visit. Every times she eats has severe pain in lower quadrants, denies any UTI. Denies in blood in stools., Has had bowel obstruction in the past. No nausea or vomiting.  We she is up moving around she does not typically have any pain     Review Of Systems:  GEN- denies fatigue, fever, weight loss,weakness, recent illness HEENT- denies eye drainage, change in vision, nasal discharge, CVS- denies chest pain, palpitations RESP- denies SOB, cough, wheeze ABD- denies N/V, change in stools, abd pain GU- denies dysuria, hematuria, dribbling, incontinence MSK- denies joint pain, muscle aches, injury Neuro- denies headache, dizziness, syncope, seizure activity       Objective:    BP 122/64 mmHg  Pulse 64  Temp(Src) 98.7 F (37.1 C) (Oral)  Resp 14  Ht 5\' 5"  (1.651 m)  Wt 111 lb (50.349 kg)  BMI 18.47 kg/m2 GEN- NAD, alert and oriented x3 HEENT- PERRL, EOMI, non injected sclera, pink conjunctiva, MMM, oropharynx clear Neck- Supple, no thyromegaly CVS- RRR, no murmur RESP-CTAB ABD-NABS,soft,mild TTP lower quadrants, no rebound, no guarding, no CVA tenderness  EXT- No edema Pulses- Radial, DP- 2+        Assessment & Plan:      Problem List Items Addressed This Visit    Chronic atrial fibrillation (HCC)    On anticoagulation converted currently, reviewed last cardiology note        Other Visit Diagnoses    Lower  abdominal pain        weight loss, abd pain, no sign of UTI, labs to be done, not classic of PUD or gastritis where pain located. Obtain CT/ABD Pelvis evalute for mass in abdomen or pelvic region or bowel thickening     Relevant Orders    CBC with Differential/Platelet    Comprehensive metabolic panel    Urinalysis, Routine w reflex microscopic (not at [facility]) (Completed)    CT Abdomen Pelvis Wo Contrast    Unintentional weight loss        Relevant Orders    Lipase    CT Abdomen Pelvis Wo Contrast       Note: This dictation was prepared with Dragon dictation along with smaller phrase technology. Any transcriptional errors that result from this process are unintentional.

## 2016-06-10 NOTE — Patient Instructions (Addendum)
Use the boost  CT scan to evaluate your bowels  F/u Pending results

## 2016-06-11 ENCOUNTER — Encounter: Payer: Self-pay | Admitting: Family Medicine

## 2016-06-11 LAB — CBC WITH DIFFERENTIAL/PLATELET
BASOS PCT: 0 %
Basophils Absolute: 0 cells/uL (ref 0–200)
EOS PCT: 1 %
Eosinophils Absolute: 59 cells/uL (ref 15–500)
HCT: 38.4 % (ref 35.0–45.0)
HEMOGLOBIN: 12.7 g/dL (ref 12.0–15.0)
LYMPHS ABS: 1003 {cells}/uL (ref 850–3900)
LYMPHS PCT: 17 %
MCH: 31.1 pg (ref 27.0–33.0)
MCHC: 33.1 g/dL (ref 32.0–36.0)
MCV: 94.1 fL (ref 80.0–100.0)
MONO ABS: 885 {cells}/uL (ref 200–950)
MPV: 9.3 fL (ref 7.5–12.5)
Monocytes Relative: 15 %
NEUTROS PCT: 67 %
Neutro Abs: 3953 cells/uL (ref 1500–7800)
Platelets: 196 10*3/uL (ref 140–400)
RBC: 4.08 MIL/uL (ref 3.80–5.10)
RDW: 13.4 % (ref 11.0–15.0)
WBC: 5.9 10*3/uL (ref 3.8–10.8)

## 2016-06-11 LAB — COMPREHENSIVE METABOLIC PANEL
ALK PHOS: 104 U/L (ref 33–130)
ALT: 15 U/L (ref 6–29)
AST: 22 U/L (ref 10–35)
Albumin: 3.9 g/dL (ref 3.6–5.1)
BILIRUBIN TOTAL: 0.8 mg/dL (ref 0.2–1.2)
BUN: 9 mg/dL (ref 7–25)
CO2: 19 mmol/L — AB (ref 20–31)
CREATININE: 0.76 mg/dL (ref 0.60–0.93)
Calcium: 9.2 mg/dL (ref 8.6–10.4)
Chloride: 101 mmol/L (ref 98–110)
GLUCOSE: 73 mg/dL (ref 70–99)
Potassium: 3.7 mmol/L (ref 3.5–5.3)
SODIUM: 139 mmol/L (ref 135–146)
Total Protein: 7.1 g/dL (ref 6.1–8.1)

## 2016-06-11 LAB — LIPASE: Lipase: 27 U/L (ref 7–60)

## 2016-06-11 NOTE — Assessment & Plan Note (Signed)
On anticoagulation converted currently, reviewed last cardiology note

## 2016-06-17 ENCOUNTER — Other Ambulatory Visit: Payer: Self-pay | Admitting: *Deleted

## 2016-06-17 ENCOUNTER — Ambulatory Visit
Admission: RE | Admit: 2016-06-17 | Discharge: 2016-06-17 | Disposition: A | Discharge status: Home / Self Care | Payer: Medicare Other | Source: Ambulatory Visit | Attending: Family Medicine | Admitting: Family Medicine

## 2016-06-17 DIAGNOSIS — R634 Abnormal weight loss: Secondary | ICD-10-CM

## 2016-06-17 DIAGNOSIS — R1030 Lower abdominal pain, unspecified: Secondary | ICD-10-CM

## 2016-06-17 MED ORDER — AMOXICILLIN-POT CLAVULANATE 875-125 MG PO TABS: 1.0000 | ORAL_TABLET | Freq: Two times a day (BID) | ORAL | Status: DC

## 2016-06-23 ENCOUNTER — Other Ambulatory Visit: Payer: Self-pay | Admitting: Family Medicine

## 2016-06-23 ENCOUNTER — Telehealth: Payer: Self-pay | Admitting: Family Medicine

## 2016-06-23 DIAGNOSIS — K921 Melena: Secondary | ICD-10-CM

## 2016-06-23 DIAGNOSIS — K5732 Diverticulitis of large intestine without perforation or abscess without bleeding: Secondary | ICD-10-CM

## 2016-06-24 ENCOUNTER — Encounter (INDEPENDENT_AMBULATORY_CARE_PROVIDER_SITE_OTHER): Payer: Self-pay | Admitting: Internal Medicine

## 2016-07-09 ENCOUNTER — Other Ambulatory Visit (INDEPENDENT_AMBULATORY_CARE_PROVIDER_SITE_OTHER): Payer: Self-pay | Admitting: Internal Medicine

## 2016-07-09 ENCOUNTER — Encounter (INDEPENDENT_AMBULATORY_CARE_PROVIDER_SITE_OTHER): Payer: Self-pay | Admitting: *Deleted

## 2016-07-09 ENCOUNTER — Encounter (INDEPENDENT_AMBULATORY_CARE_PROVIDER_SITE_OTHER): Payer: Self-pay | Admitting: Internal Medicine

## 2016-07-09 ENCOUNTER — Ambulatory Visit (INDEPENDENT_AMBULATORY_CARE_PROVIDER_SITE_OTHER): Payer: Medicare Other | Admitting: Internal Medicine

## 2016-07-09 ENCOUNTER — Telehealth (INDEPENDENT_AMBULATORY_CARE_PROVIDER_SITE_OTHER): Payer: Self-pay | Admitting: Internal Medicine

## 2016-07-09 ENCOUNTER — Other Ambulatory Visit (INDEPENDENT_AMBULATORY_CARE_PROVIDER_SITE_OTHER): Payer: Self-pay | Admitting: *Deleted

## 2016-07-09 ENCOUNTER — Telehealth (INDEPENDENT_AMBULATORY_CARE_PROVIDER_SITE_OTHER): Payer: Self-pay | Admitting: *Deleted

## 2016-07-09 VITALS — BP 102/66 | HR 56 | Temp 97.9°F | Resp 18 | Ht 65.0 in | Wt 110.1 lb

## 2016-07-09 DIAGNOSIS — K5732 Diverticulitis of large intestine without perforation or abscess without bleeding: Secondary | ICD-10-CM | POA: Diagnosis not present

## 2016-07-09 DIAGNOSIS — K625 Hemorrhage of anus and rectum: Secondary | ICD-10-CM

## 2016-07-09 DIAGNOSIS — R933 Abnormal findings on diagnostic imaging of other parts of digestive tract: Secondary | ICD-10-CM

## 2016-07-09 DIAGNOSIS — R935 Abnormal findings on diagnostic imaging of other abdominal regions, including retroperitoneum: Secondary | ICD-10-CM | POA: Diagnosis not present

## 2016-07-09 NOTE — Telephone Encounter (Signed)
Kelly Dougherty, Colonoscopy. Patient on Eliquis

## 2016-07-09 NOTE — Telephone Encounter (Signed)
Patient is sch'd for colonoscopy 07/30/16 and she needs to stop Eliquis 2 days prior, please advise if this is ok

## 2016-07-09 NOTE — Progress Notes (Addendum)
Subjective:    Patient ID: Kelly Dougherty, female    DOB: November 27, 1941, 75 y.o.   MRN: [id number]  HPI Referred by Dr. Buelah Manis for blood in her stools, diverticulitis, colonoscopy. Underwent CT scan in June. During this time she was having diarrhea with severe cramps. She was having one big diarrhea episode and then a couple of small stools (loose). She did have a low grade fever during this time. Pain lasted for about a month.  She c/o lower abdominal pain for a couple of weeks before she had the CT scan.  She tells me she a "kink" in her colon a couple of years ago (bowel obstruction). She says she was having bloody stools.  She saw Dr Buelah Manis and underwent a CT scan (see below).  She was started on Augmentin x 10 days.  She has lost about 4-5 since her OV.  She says she has felt pressure in her lower abdomen which after a few hours will resolve. She has not seen any blood in about a week and a half.  Her last colonoscopy was greater than 10 yrs and was normal.  She had a cologuard last year by Dr. Isidore Moos in [facility] and was normal.  Her appetite is good for the most part. She is having a BM. No diarrhea now.  Hx of atrial fib and maintained on Eliquis x 2 yrs.     06/17/2016 CT abdomen/pelvis with CM; abdominal pain:     CLINICAL DATA: Weight loss with left lower quadrant abdominal pain for 1 month. History bowel obstruction and appendectomy.    IMPRESSION: 1. Suspected pelvic pathology involving the sigmoid colon with surrounding soft tissue stranding and possible complex extraluminal fluid. Based on CT appearance, this could reflect diverticulitis. However, the patient has no leukocytosis, and given the weight loss, malignancy cannot be excluded. Depending on the clinical concern, close follow-up or sigmoidoscopy recommended. 2. No evidence of bowel obstruction or drainable abscess. 3. Aortic atherosclerosis. 4. These results will be called to the  ordering clinician or representative by the Radiologist Assistant, and communication documented in the PACS or zVision Dashboard.   CBC    Component Value Date/Time   WBC 5.9 06/10/2016 0829   RBC 4.08 06/10/2016 0829   HGB 12.7 06/10/2016 0829   HCT 38.4 06/10/2016 0829   PLT 196 06/10/2016 0829   MCV 94.1 06/10/2016 0829   MCH 31.1 06/10/2016 0829   MCHC 33.1 06/10/2016 0829   RDW 13.4 06/10/2016 0829   LYMPHSABS 1003 06/10/2016 0829   MONOABS 885 06/10/2016 0829   EOSABS 59 06/10/2016 0829   BASOSABS 0 06/10/2016 0829      Review of Systems Past Medical History  Diagnosis Date  . Dysrhythmia     04/2015 with atrial fibrillation  . Cataract   . Glaucoma   . Atrial fibrillation ([place])   . Arthritis     Past Surgical History  Procedure Laterality Date  . Eye surgery      left  . Hemorroidectomy    . Tonsillectomy      childhood  . Cardioversion N/A 10/03/2015    Procedure: CARDIOVERSION;  Surgeon: Herminio Commons, MD;  Location: [facility];  Service: Cardiovascular;  Laterality: N/A;  . Appendectomy      75 years of age    No Known Allergies  Current Outpatient Prescriptions on File Prior to Visit  Medication Sig Dispense Refill  . calcium-vitamin D (OSCAL WITH D) 500-200 MG-UNIT tablet Take  1 tablet by mouth.    [name] Calcium (STOOL SOFTENER PO) Take by mouth.    . dronedarone (MULTAQ) 400 MG tablet Take 1 tablet (400 mg total) by mouth 2 (two) times daily with a meal. 60 tablet 11  . ELIQUIS 5 MG TABS tablet TAKE ONE TABLET BY MOUTH TWICE DAILY 60 tablet 6  . furosemide (LASIX) 20 MG tablet Take 40 mg for 3 days starting today 08/06/15, and the take 20 mg daily prn as needed for swelling 90 tablet 3  . latanoprost (XALATAN) 0.005 % ophthalmic solution Place 1 drop into both eyes at bedtime.     . potassium chloride (K-DUR) 10 MEQ tablet Take 20 meq (2 tabs) starting today 08/06/15 for the next 3 days and then take 10 meq only on days you taking Lasix for  swelling 90 tablet 3  . Psyllium (METAMUCIL PO) Take by mouth.    . timolol (TIMOPTIC) 0.5 % ophthalmic solution Place 1 drop into the left eye 2 (two) times daily.     No current facility-administered medications on file prior to visit.        Objective:   Physical ExamBlood pressure 102/66, pulse 56, temperature 97.9 F (36.6 C), resp. rate 18, height 5\' 5"  (1.651 m), weight 110 lb 2 oz (49.952 kg), SpO2 99 %.  Alert and oriented. Skin warm and dry. Oral mucosa is moist.   . Sclera anicteric, conjunctivae is pink. Thyroid not enlarged. No cervical lymphadenopathy. Lungs clear. Heart regular rate and rhythm.  Abdomen is soft. Bowel sounds are positive. No hepatomegaly. No abdominal masses felt. No tenderness.  No edema to lower extremities.        Assessment & Plan:  Rectal bleeding, abnormal CT.? Diverticulitis.  Abdominal pain. CT scan in June showed : 1. Suspected pelvic pathology involving the sigmoid colon with surrounding soft tissue stranding and possible complex extraluminal fluid. Based on CT appearance, this could reflect diverticulitis. Colonic neoplasm needs to be ruled out. Colonoscopy. The risks and benefits such as perforation, bleeding, and infection were reviewed with the patient and is agreeable. Will get results of cologuard from last year.

## 2016-07-09 NOTE — Telephone Encounter (Signed)
Patient needs trilyte 

## 2016-07-09 NOTE — Patient Instructions (Signed)
The risks and benefits such as perforation, bleeding, and infection were reviewed with the patient and is agreeable. 

## 2016-07-09 NOTE — Telephone Encounter (Signed)
TCS sch'd 07/30/16, patient aware

## 2016-07-10 MED ORDER — PEG 3350-KCL-NA BICARB-NACL 420 G PO SOLR: 4000.0000 mL | Freq: Once | ORAL | Status: DC

## 2016-07-10 NOTE — Telephone Encounter (Signed)
Pt has a CHADS score of 2 (age, cardiomyopathy).  Per protocol, okay to hold eliquis x 48 hours prior to colonoscopy.

## 2016-07-13 NOTE — Telephone Encounter (Signed)
Patient aware.

## 2016-07-15 ENCOUNTER — Encounter (INDEPENDENT_AMBULATORY_CARE_PROVIDER_SITE_OTHER): Payer: Self-pay

## 2016-07-30 ENCOUNTER — Encounter (HOSPITAL_COMMUNITY): Payer: Self-pay | Admitting: *Deleted

## 2016-07-30 ENCOUNTER — Encounter (HOSPITAL_COMMUNITY)
Admission: RE | Disposition: A | Discharge status: Home / Self Care | Payer: Self-pay | Source: Ambulatory Visit | Attending: Internal Medicine

## 2016-07-30 ENCOUNTER — Ambulatory Visit (HOSPITAL_COMMUNITY)
Admission: RE | Admit: 2016-07-30 | Discharge: 2016-07-30 | Disposition: A | Discharge status: Home / Self Care | Payer: Medicare Other | Source: Ambulatory Visit | Attending: Internal Medicine | Admitting: Internal Medicine

## 2016-07-30 DIAGNOSIS — R933 Abnormal findings on diagnostic imaging of other parts of digestive tract: Secondary | ICD-10-CM

## 2016-07-30 DIAGNOSIS — K921 Melena: Secondary | ICD-10-CM | POA: Insufficient documentation

## 2016-07-30 DIAGNOSIS — K644 Residual hemorrhoidal skin tags: Secondary | ICD-10-CM | POA: Diagnosis not present

## 2016-07-30 DIAGNOSIS — Z79899 Other long term (current) drug therapy: Secondary | ICD-10-CM | POA: Insufficient documentation

## 2016-07-30 DIAGNOSIS — Z8719 Personal history of other diseases of the digestive system: Secondary | ICD-10-CM

## 2016-07-30 DIAGNOSIS — K5730 Diverticulosis of large intestine without perforation or abscess without bleeding: Secondary | ICD-10-CM | POA: Insufficient documentation

## 2016-07-30 DIAGNOSIS — I4891 Unspecified atrial fibrillation: Secondary | ICD-10-CM | POA: Diagnosis not present

## 2016-07-30 DIAGNOSIS — H409 Unspecified glaucoma: Secondary | ICD-10-CM | POA: Diagnosis not present

## 2016-07-30 DIAGNOSIS — Z7901 Long term (current) use of anticoagulants: Secondary | ICD-10-CM | POA: Diagnosis not present

## 2016-07-30 DIAGNOSIS — K648 Other hemorrhoids: Secondary | ICD-10-CM | POA: Insufficient documentation

## 2016-07-30 DIAGNOSIS — M1990 Unspecified osteoarthritis, unspecified site: Secondary | ICD-10-CM | POA: Diagnosis not present

## 2016-07-30 DIAGNOSIS — K5732 Diverticulitis of large intestine without perforation or abscess without bleeding: Secondary | ICD-10-CM

## 2016-07-30 HISTORY — PX: COLONOSCOPY: SHX5424

## 2016-07-30 SURGERY — COLONOSCOPY
Anesthesia: Moderate Sedation

## 2016-07-30 MED ORDER — MEPERIDINE HCL 50 MG/ML IJ SOLN
INTRAMUSCULAR | Status: DC | PRN
  Administered 2016-07-30: 15 mg via INTRAVENOUS
  Administered 2016-07-30: 10 mg via INTRAVENOUS

## 2016-07-30 MED ORDER — MIDAZOLAM HCL 5 MG/5ML IJ SOLN
INTRAMUSCULAR | Status: AC
  Filled 2016-07-30: qty 10

## 2016-07-30 MED ORDER — MIDAZOLAM HCL 5 MG/5ML IJ SOLN
INTRAMUSCULAR | Status: DC | PRN
  Administered 2016-07-30: 2 mg via INTRAVENOUS
  Administered 2016-07-30 (×2): 1 mg via INTRAVENOUS

## 2016-07-30 MED ORDER — STERILE WATER FOR IRRIGATION IR SOLN
Status: DC | PRN
  Administered 2016-07-30: 09:00:00

## 2016-07-30 MED ORDER — MEPERIDINE HCL 50 MG/ML IJ SOLN
INTRAMUSCULAR | Status: AC
  Filled 2016-07-30: qty 1

## 2016-07-30 MED ORDER — SODIUM CHLORIDE 0.9 % IV SOLN
INTRAVENOUS | Status: DC
  Administered 2016-07-30: 1000 mL via INTRAVENOUS

## 2016-07-30 NOTE — H&P (Signed)
Kelly Dougherty is an 75 y.o. female.   Chief Complaint: Patient is here for colonoscopy. HPI: Patient is 75 year old Caucasian female who was recently evaluated for abdominal pain diarrhea and rectal bleeding and found to have diverticulitis. She's been treated with antibiotics with resolution of her abdominal pain. She has not had any more episodes of rectal bleeding. Last colonoscopy was in 1997. She did lose a few pounds recently. Weight loss has stopped. She recalls she was told she had colonic diverticulosis and she had appendicectomy at age 50. Family history is negative for CRC.  Past Medical History:  Diagnosis Date  . Arthritis   . Atrial fibrillation ([place])   . Cataract   . Dysrhythmia    04/2015 with atrial fibrillation  . Glaucoma     Past Surgical History:  Procedure Laterality Date  . APPENDECTOMY     75 years of age  . CARDIOVERSION N/A 10/03/2015   Procedure: CARDIOVERSION;  Surgeon: Herminio Commons, MD;  Location: [facility];  Service: Cardiovascular;  Laterality: N/A;  . EYE SURGERY     left  . HEMORROIDECTOMY    . TONSILLECTOMY     childhood    Family History  Problem Relation Age of Onset  . Hearing loss Mother   . Alzheimer's disease Father    Social History:  reports that she has never smoked. She has never used smokeless tobacco. She reports that she drinks about 4.2 oz of alcohol per week . She reports that she does not use drugs.  Allergies: No Known Allergies  Medications Prior to Admission  Medication Sig Dispense Refill  . calcium-vitamin D (OSCAL WITH D) 500-200 MG-UNIT tablet Take 1 tablet by mouth.    [name] Calcium (STOOL SOFTENER PO) Take by mouth.    . dronedarone (MULTAQ) 400 MG tablet Take 1 tablet (400 mg total) by mouth 2 (two) times daily with a meal. 60 tablet 11  . ELIQUIS 5 MG TABS tablet TAKE ONE TABLET BY MOUTH TWICE DAILY 60 tablet 6  . latanoprost (XALATAN) 0.005 % ophthalmic solution Place 1 drop into both eyes at bedtime.      . timolol (TIMOPTIC) 0.5 % ophthalmic solution Place 1 drop into the left eye 2 (two) times daily.    . furosemide (LASIX) 20 MG tablet Take 40 mg for 3 days starting today 08/06/15, and the take 20 mg daily prn as needed for swelling 90 tablet 3  . polyethylene glycol-electrolytes (TRILYTE) 420 g solution Take 4,000 mLs by mouth once. 4000 mL 0  . potassium chloride (K-DUR) 10 MEQ tablet Take 20 meq (2 tabs) starting today 08/06/15 for the next 3 days and then take 10 meq only on days you taking Lasix for swelling 90 tablet 3  . Psyllium (METAMUCIL PO) Take by mouth daily.       No results found for this or any previous visit (from the past 48 hour(s)). No results found.  ROS  Blood pressure 122/73, pulse 65, temperature 97.5 F (36.4 C), temperature source Oral, resp. rate 16, height 5\' 5"  (1.651 m), weight 105 lb (47.6 kg), SpO2 100 %. Physical Exam  Constitutional:  Well-developed thin Caucasian female in NAD.  HENT:  Mouth/Throat: Oropharynx is clear and moist.  Eyes: Conjunctivae are normal. No scleral icterus.  Neck: No thyromegaly present.  Cardiovascular: Normal rate, regular rhythm and normal heart sounds.   No murmur heard. Respiratory: Effort normal and breath sounds normal.  GI:  Abdomen is flat and soft  with palpable but not expanded aorta. No organomegaly or masses.  Musculoskeletal: She exhibits no edema.  Lymphadenopathy:    She has no cervical adenopathy.  Neurological: She is alert.  Skin: Skin is warm and dry.     Assessment/Plan History of diverticulitis. Rectal bleeding. Diagnostic colonoscopy.  Hildred Laser, MD 07/30/2016, 8:34 AM

## 2016-07-30 NOTE — Discharge Instructions (Signed)
Resume usual medications including Eliquis. High fiber diet. No driving for 24 hours.     Colonoscopy, Care After These instructions give you information on caring for yourself after your procedure. Your doctor may also give you more specific instructions. Call your doctor if you have any problems or questions after your procedure. HOME CARE  Do not drive for 24 hours.  Do not sign important papers or use machinery for 24 hours.  You may shower.  You may go back to your usual activities, but go slower for the first 24 hours.  Take rest breaks often during the first 24 hours.  Walk around or use warm packs on your belly (abdomen) if you have belly cramping or gas.  Drink enough fluids to keep your pee (urine) clear or pale yellow.  Resume your normal diet. Avoid heavy or fried foods.  Avoid drinking alcohol for 24 hours or as told by your doctor.  Only take medicines as told by your doctor. If a tissue sample (biopsy) was taken during the procedure:   Do not take aspirin or blood thinners for 7 days, or as told by your doctor.  Do not drink alcohol for 7 days, or as told by your doctor.  Eat soft foods for the first 24 hours. GET HELP IF: You still have a small amount of blood in your poop (stool) 2-3 days after the procedure. GET HELP RIGHT AWAY IF:  You have more than a small amount of blood in your poop.  You see clumps of tissue (blood clots) in your poop.  Your belly is puffy (swollen).  You feel sick to your stomach (nauseous) or throw up (vomit).  You have a fever.  You have belly pain that gets worse and medicine does not help. MAKE SURE YOU:  Understand these instructions.  Will watch your condition.  Will get help right away if you are not doing well or get worse.   This information is not intended to replace advice given to you by your health care provider. Make sure you discuss any questions you have with your health care provider.   Document  Released: 01/16/2011 Document Revised: 12/19/2013 Document Reviewed: 08/21/2013 Elsevier Interactive Patient Education [employer].   Diverticulosis Diverticulosis is the condition that develops when small pouches (diverticula) form in the wall of your colon. Your colon, or large intestine, is where water is absorbed and stool is formed. The pouches form when the inside layer of your colon pushes through weak spots in the outer layers of your colon. CAUSES  No one knows exactly what causes diverticulosis. RISK FACTORS  Being older than 65. Your risk for this condition increases with age. Diverticulosis is rare in people younger than 40 years. By age 46, almost everyone has it.  Eating a low-fiber diet.  Being frequently constipated.  Being overweight.  Not getting enough exercise.  Smoking.  Taking over-the-counter pain medicines, like aspirin and ibuprofen. SYMPTOMS  Most people with diverticulosis do not have symptoms. DIAGNOSIS  Because diverticulosis often has no symptoms, health care providers often discover the condition during an exam for other colon problems. In many cases, a health care provider will diagnose diverticulosis while using a flexible scope to examine the colon (colonoscopy). TREATMENT  If you have never developed an infection related to diverticulosis, you may not need treatment. If you have had an infection before, treatment may include:  Eating more fruits, vegetables, and grains.  Taking a fiber supplement.  Taking a live  bacteria supplement (probiotic).  Taking medicine to relax your colon. HOME CARE INSTRUCTIONS   Drink at least 6-8 glasses of water each day to prevent constipation.  Try not to strain when you have a bowel movement.  Keep all follow-up appointments. If you have had an infection before:  Increase the fiber in your diet as directed by your health care provider or dietitian.  Take a dietary fiber supplement if your  health care provider approves.  Only take medicines as directed by your health care provider. SEEK MEDICAL CARE IF:   You have abdominal pain.  You have bloating.  You have cramps.  You have not gone to the bathroom in 3 days. SEEK IMMEDIATE MEDICAL CARE IF:   Your pain gets worse.  Yourbloating becomes very bad.  You have a fever or chills, and your symptoms suddenly get worse.  You begin vomiting.  You have bowel movements that are bloody or black. MAKE SURE YOU:  Understand these instructions.  Will watch your condition.  Will get help right away if you are not doing well or get worse.   This information is not intended to replace advice given to you by your health care provider. Make sure you discuss any questions you have with your health care provider.   Document Released: 09/10/2004 Document Revised: 12/19/2013 Document Reviewed: 11/08/2013 Elsevier Interactive Patient Education 2016 Elsevier Inc.   High-Fiber Diet Fiber, also called dietary fiber, is a type of carbohydrate found in fruits, vegetables, whole grains, and beans. A high-fiber diet can have many health benefits. Your health care provider may recommend a high-fiber diet to help:  Prevent constipation. Fiber can make your bowel movements more regular.  Lower your cholesterol.  Relieve hemorrhoids, uncomplicated diverticulosis, or irritable bowel syndrome.  Prevent overeating as part of a weight-loss plan.  Prevent heart disease, type 2 diabetes, and certain cancers. WHAT IS MY PLAN? The recommended daily intake of fiber includes:  38 grams for men under age 20.  2 grams for men over age 56.  1 grams for women under age 31.  40 grams for women over age 31. You can get the recommended daily intake of dietary fiber by eating a variety of fruits, vegetables, grains, and beans. Your health care provider may also recommend a fiber supplement if it is not possible to get enough fiber through  your diet. WHAT DO I NEED TO KNOW ABOUT A HIGH-FIBER DIET?  Fiber supplements have not been widely studied for their effectiveness, so it is better to get fiber through food sources.  Always check the fiber content on thenutrition facts label of any prepackaged food. Look for foods that contain at least 5 grams of fiber per serving.  Ask your dietitian if you have questions about specific foods that are related to your condition, especially if those foods are not listed in the following section.  Increase your daily fiber consumption gradually. Increasing your intake of dietary fiber too quickly may cause bloating, cramping, or gas.  Drink plenty of water. Water helps you to digest fiber. WHAT FOODS CAN I EAT? Grains Whole-grain breads. Multigrain cereal. Oats and oatmeal. Brown rice. Barley. Bulgur wheat. [place]. Bran muffins. Popcorn. Rye wafer crackers. Vegetables Sweet potatoes. Spinach. Kale. Artichokes. Cabbage. Broccoli. Green peas. Carrots. Squash. Fruits Berries. Pears. Apples. Oranges. Avocados. Prunes and raisins. Dried figs. Meats and Other Protein Sources [place], kidney, pinto, and soy beans. Split peas. Lentils. Nuts and seeds. Dairy Fiber-fortified yogurt. Beverages Fiber-fortified soy milk. Fiber-fortified orange  juice. Other Fiber bars. The items listed above may not be a complete list of recommended foods or beverages. Contact your dietitian for more options. WHAT FOODS ARE NOT RECOMMENDED? Grains White bread. Pasta made with refined flour. White rice. Vegetables Fried potatoes. Canned vegetables. Well-cooked vegetables.  Fruits Fruit juice. Cooked, strained fruit. Meats and Other Protein Sources Fatty cuts of meat. Fried Sales executive or fried fish. Dairy Milk. Yogurt. Cream cheese. Sour cream. Beverages Soft drinks. Other Cakes and pastries. Butter and oils. The items listed above may not be a complete list of foods and beverages to avoid. Contact your  dietitian for more information. WHAT ARE SOME TIPS FOR INCLUDING HIGH-FIBER FOODS IN MY DIET?  Eat a wide variety of high-fiber foods.  Make sure that half of all grains consumed each day are whole grains.  Replace breads and cereals made from refined flour or white flour with whole-grain breads and cereals.  Replace white rice with brown rice, bulgur wheat, or millet.  Start the day with a breakfast that is high in fiber, such as a cereal that contains at least 5 grams of fiber per serving.  Use beans in place of meat in soups, salads, or pasta.  Eat high-fiber snacks, such as berries, raw vegetables, nuts, or popcorn.   This information is not intended to replace advice given to you by your health care provider. Make sure you discuss any questions you have with your health care provider.   Document Released: 12/14/2005 Document Revised: 01/04/2015 Document Reviewed: 05/29/2014 Elsevier Interactive Patient Education 2016 [employer].   Hemorrhoids Hemorrhoids are swollen veins around the rectum or anus. There are two types of hemorrhoids:   Internal hemorrhoids. These occur in the veins just inside the rectum. They may poke through to the outside and become irritated and painful.  External hemorrhoids. These occur in the veins outside the anus and can be felt as a painful swelling or hard lump near the anus. CAUSES  Pregnancy.   Obesity.   Constipation or diarrhea.   Straining to have a bowel movement.   Sitting for long periods on the toilet.  Heavy lifting or other activity that caused you to strain.  Anal intercourse. SYMPTOMS   Pain.   Anal itching or irritation.   Rectal bleeding.   Fecal leakage.   Anal swelling.   One or more lumps around the anus.  DIAGNOSIS  Your caregiver may be able to diagnose hemorrhoids by visual examination. Other examinations or tests that may be performed include:   Examination of the rectal area with a gloved  hand (digital rectal exam).   Examination of anal canal using a small tube (scope).   A blood test if you have lost a significant amount of blood.  A test to look inside the colon (sigmoidoscopy or colonoscopy). TREATMENT Most hemorrhoids can be treated at home. However, if symptoms do not seem to be getting better or if you have a lot of rectal bleeding, your caregiver may perform a procedure to help make the hemorrhoids get smaller or remove them completely. Possible treatments include:   Placing a rubber band at the base of the hemorrhoid to cut off the circulation (rubber band ligation).   Injecting a chemical to shrink the hemorrhoid (sclerotherapy).   Using a tool to burn the hemorrhoid (infrared light therapy).   Surgically removing the hemorrhoid (hemorrhoidectomy).   Stapling the hemorrhoid to block blood flow to the tissue (hemorrhoid stapling).  HOME CARE INSTRUCTIONS   Eat  foods with fiber, such as whole grains, beans, nuts, fruits, and vegetables. Ask your doctor about taking products with added fiber in them (fibersupplements).  Increase fluid intake. Drink enough water and fluids to keep your urine clear or pale yellow.   Exercise regularly.   Go to the bathroom when you have the urge to have a bowel movement. Do not wait.   Avoid straining to have bowel movements.   Keep the anal area dry and clean. Use wet toilet paper or moist towelettes after a bowel movement.   Medicated creams and suppositories may be used or applied as directed.   Only take over-the-counter or prescription medicines as directed by your caregiver.   Take warm sitz baths for 15-20 minutes, 3-4 times a day to ease pain and discomfort.   Place ice packs on the hemorrhoids if they are tender and swollen. Using ice packs between sitz baths may be helpful.   Put ice in a plastic bag.   Place a towel between your skin and the bag.   Leave the ice on for 15-20 minutes, 3-4  times a day.   Do not use a donut-shaped pillow or sit on the toilet for long periods. This increases blood pooling and pain.  SEEK MEDICAL CARE IF:  You have increasing pain and swelling that is not controlled by treatment or medicine.  You have uncontrolled bleeding.  You have difficulty or you are unable to have a bowel movement.  You have pain or inflammation outside the area of the hemorrhoids. MAKE SURE YOU:  Understand these instructions.  Will watch your condition.  Will get help right away if you are not doing well or get worse.   This information is not intended to replace advice given to you by your health care provider. Make sure you discuss any questions you have with your health care provider.   Document Released: 12/11/2000 Document Revised: 11/30/2012 Document Reviewed: 10/18/2012 Elsevier Interactive Patient Education [employer].

## 2016-07-30 NOTE — Op Note (Signed)
[facility] Patient Name: Kelly Dougherty Procedure Date: 07/30/2016 8:22 AM MRN: [id number] Date of Birth: 12/10/1941 Attending MD: Hildred Laser , MD CSN: [id number] Age: 75 Admit Type: Outpatient Procedure:                Colonoscopy Indications:              Hematochezia, Follow-up of diverticulitis Providers:                Hildred Laser, MD, Otis Peak B. Sharon Seller, RN, Shelby Mattocks, Technician Referring MD:             Modena Nunnery. , MD Medicines:                Meperidine 25 mg IV, Midazolam 4 mg IV Complications:            No immediate complications. Estimated Blood Loss:     Estimated blood loss: none. Procedure:                Pre-Anesthesia Assessment:                           - Prior to the procedure, a History and Physical                            was performed, and patient medications and                            allergies were reviewed. The patient's tolerance of                            previous anesthesia was also reviewed. The risks                            and benefits of the procedure and the sedation                            options and risks were discussed with the patient.                            All questions were answered, and informed consent                            was obtained. Prior Anticoagulants: The patient                            last took Eliquis (apixaban) 3 days prior to the                            procedure. ASA Grade Assessment: III - A patient                            with severe systemic disease. After reviewing the  risks and benefits, the patient was deemed in                            satisfactory condition to undergo the procedure.                           After obtaining informed consent, the colonoscope                            was passed under direct vision. Throughout the                            procedure, the patient's blood pressure, pulse, and                             oxygen saturations were monitored continuously. The                            [id number]) scope was introduced through                            the anus and advanced to the the cecum, identified                            by appendiceal orifice and ileocecal valve. The                            colonoscopy was performed without difficulty. The                            patient tolerated the procedure well. The quality                            of the bowel preparation was excellent. The                            ileocecal valve, appendiceal orifice, and rectum                            were photographed. Scope In: 8:44:20 AM Scope Out: 8:59:21 AM Scope Withdrawal Time: 0 hours 6 minutes 40 seconds  Total Procedure Duration: 0 hours 15 minutes 1 second  Findings:      The splenic flexure, transverse colon, hepatic flexure, ascending colon       and cecum appeared normal.      A few medium-mouthed diverticula were found in the sigmoid colon and       descending colon.      External and internal hemorrhoids were found during retroflexion. The       hemorrhoids were small. Impression:               - The splenic flexure, transverse colon, hepatic                            flexure, ascending colon and cecum are normal.                           -  Diverticulosis in the sigmoid colon and in the                            descending colon.                           - External and internal hemorrhoids.                           - No specimens collected. Moderate Sedation:      Moderate (conscious) sedation was administered by the endoscopy nurse       and supervised by the endoscopist. The following parameters were       monitored: oxygen saturation, heart rate, blood pressure, CO2       capnography and response to care. Total physician intraservice time was       19 minutes. Recommendation:           - Patient has a contact number available for                             emergencies. The signs and symptoms of potential                            delayed complications were discussed with the                            patient. Return to normal activities tomorrow.                            Written discharge instructions were provided to the                            patient.                           - High fiber diet today.                           - Continue present medications.                           - Resume Eliquis (apixaban) at prior dose today.                           - No repeat colonoscopy due to the absence of                            advanced adenomas. Procedure Code(s):        --- Professional ---                           [phone], Colonoscopy, flexible; diagnostic, including                            collection of specimen(s) by brushing or washing,  when performed (separate procedure)                           99152, Moderate sedation services provided by the                            same physician or other qualified health care                            professional performing the diagnostic or                            therapeutic service that the sedation supports,                            requiring the presence of an independent trained                            observer to assist in the monitoring of the                            patient's level of consciousness and physiological                            status; initial 15 minutes of intraservice time,                            patient age 71 years or older Diagnosis Code(s):        --- Professional ---                           K64.8, Other hemorrhoids                           K92.1, Melena (includes Hematochezia)                           K57.32, Diverticulitis of large intestine without                            perforation or abscess without bleeding                           K57.30, Diverticulosis of large  intestine without                            perforation or abscess without bleeding CPT copyright 2016 American Medical Association. All rights reserved. The codes documented in this report are preliminary and upon coder review may  be revised to meet current compliance requirements. Hildred Laser, MD Hildred Laser, MD 07/30/2016 9:09:01 AM This report has been signed electronically. Number of Addenda: 0

## 2016-08-06 ENCOUNTER — Encounter (HOSPITAL_COMMUNITY): Payer: Self-pay | Admitting: Internal Medicine

## 2016-09-24 ENCOUNTER — Other Ambulatory Visit: Payer: Self-pay

## 2016-09-24 MED ORDER — DRONEDARONE HCL 400 MG PO TABS: 400.0000 mg | ORAL_TABLET | Freq: Two times a day (BID) | ORAL | 11 refills | Status: DC

## 2016-09-24 NOTE — Telephone Encounter (Signed)
Refilled multaq

## 2016-10-27 ENCOUNTER — Encounter: Payer: Self-pay | Admitting: Family Medicine

## 2016-10-27 ENCOUNTER — Ambulatory Visit (INDEPENDENT_AMBULATORY_CARE_PROVIDER_SITE_OTHER): Payer: Medicare Other | Admitting: Family Medicine

## 2016-10-27 VITALS — BP 108/62 | HR 60 | Temp 97.7°F | Resp 14 | Ht 65.0 in | Wt 111.0 lb

## 2016-10-27 DIAGNOSIS — H6121 Impacted cerumen, right ear: Secondary | ICD-10-CM

## 2016-10-27 DIAGNOSIS — I482 Chronic atrial fibrillation, unspecified: Secondary | ICD-10-CM

## 2016-10-27 DIAGNOSIS — D489 Neoplasm of uncertain behavior, unspecified: Secondary | ICD-10-CM

## 2016-10-27 MED ORDER — CARBAMIDE PEROXIDE 6.5 % OT SOLN: 5.0000 [drp] | Freq: Two times a day (BID) | OTIC | 0 refills | Status: DC

## 2016-10-27 NOTE — Assessment & Plan Note (Signed)
Doing well on medications 

## 2016-10-27 NOTE — Patient Instructions (Addendum)
Continue current medications We will call with pathology results  F/U for Physical in Dec/Jan F/U Friday NURSE VISIT FOR EARS

## 2016-10-27 NOTE — Progress Notes (Signed)
   Subjective:    Patient ID: Kelly Dougherty, female    DOB: 06-19-1941, 75 y.o.   MRN: [id number]  Patient presents for Follow-up (is not fasting); R Ear Stopped Up (reports that ear feels clogged and she has pressure in it); and L Arm Irritation (x years- has red scaly area to L arm- staes that it has begun to get bigger and is itching) Patient follow-up medications. She's no concerns with her cardiac medication she's not had any chest pain or shortness of breath. She has not required Lasix  She feels like her right ear is stopped up she's been using over-the-counter items to help clean out her ears this has not helped.  She has had no red spot on her left arm that has been present for many years she is not sure what it actually is but over the past few months it has doubled in size there is no pain no drainage    Review Of Systems:  GEN- denies fatigue, fever, weight loss,weakness, recent illness HEENT- denies eye drainage, change in vision, nasal discharge, CVS- denies chest pain, palpitations RESP- denies SOB, cough, wheeze ABD- denies N/V, change in stools, abd pain GU- denies dysuria, hematuria, dribbling, incontinence MSK- denies joint pain, muscle aches, injury Neuro- denies headache, dizziness, syncope, seizure activity       Objective:    BP 108/62 (BP Location: Right Arm, Patient Position: Sitting, Cuff Size: Normal)   Pulse 60   Temp 97.7 F (36.5 C) (Oral)   Resp 14   Ht 5\' 5"  (1.651 m)   Wt 111 lb (50.3 kg)   BMI 18.47 kg/m  GEN- NAD, alert and oriented x3 HEENT- PERRL, EOMI, non injected sclera, pink conjunctiva, MMM, oropharynx clear, Left canal clear, TM clear, Right canal- impacted with wax  CVS- RRR, no murmur,occ PVC  RESP-CTAB Skin- Left upper arm- quarter size erythematous macule, with excoriations and scaley texture at center, NT   Procedure- Punch Biopsy  Procedure explained to patient questions answered benefits and risks discussed verbal consent  obtained. Antiseptic-betadine  Anesthesia-lidocaine 1% with Epi 42mm Punch used for biopsy at lower edge of lesion Minimal blood loss, steri strips applied and pressure bandage due to her oozing from blood thinner Patient tolerated procedure well Pathology sent         Assessment & Plan:      Problem List Items Addressed This Visit    Chronic atrial fibrillation ([place])    Doing well on medications.       Other Visit Diagnoses    Impacted cerumen of right ear    -  Primary   s/p irrigation unable to clean completely due to hard wax, Debrox drops sent for 3 days , return for repeat irrigation   Neoplasm, uncertain whether benign or malignant       Biopsy performed await results   Relevant Orders   Pathology Report      Note: This dictation was prepared with Dragon dictation along with smaller phrase technology. Any transcriptional errors that result from this process are unintentional.

## 2016-10-28 LAB — PATHOLOGY

## 2016-10-29 ENCOUNTER — Other Ambulatory Visit: Payer: Self-pay | Admitting: *Deleted

## 2016-10-29 DIAGNOSIS — C44611 Basal cell carcinoma of skin of unspecified upper limb, including shoulder: Secondary | ICD-10-CM

## 2016-10-30 ENCOUNTER — Ambulatory Visit: Payer: Medicare Other

## 2016-10-30 DIAGNOSIS — H6121 Impacted cerumen, right ear: Secondary | ICD-10-CM

## 2016-11-05 ENCOUNTER — Ambulatory Visit: Payer: Medicare Other | Admitting: *Deleted

## 2016-11-05 DIAGNOSIS — H6121 Impacted cerumen, right ear: Secondary | ICD-10-CM

## 2016-11-05 NOTE — Progress Notes (Signed)
Patient ID: Kelly Dougherty, female   DOB: [birth year], 75 y.o.   MRN: [id number]  Patient seen in office to complete R ear irrigation.   Reports that she has been using Debrox as directed.   Flushed ear with solution of 2:1 warm water and hydrogen peroxide.   Impacted cerumen flushed easily out of canal.

## 2016-11-12 ENCOUNTER — Other Ambulatory Visit: Payer: Self-pay | Admitting: Cardiovascular Disease

## 2017-01-04 ENCOUNTER — Encounter: Payer: Self-pay | Admitting: Family Medicine

## 2017-01-04 ENCOUNTER — Ambulatory Visit (INDEPENDENT_AMBULATORY_CARE_PROVIDER_SITE_OTHER): Payer: Medicare Other | Admitting: Family Medicine

## 2017-01-04 VITALS — BP 118/62 | HR 64 | Temp 98.1°F | Resp 14 | Ht 65.0 in | Wt 114.0 lb

## 2017-01-04 DIAGNOSIS — I482 Chronic atrial fibrillation, unspecified: Secondary | ICD-10-CM

## 2017-01-04 DIAGNOSIS — E785 Hyperlipidemia, unspecified: Secondary | ICD-10-CM | POA: Diagnosis not present

## 2017-01-04 DIAGNOSIS — Z0000 Encounter for general adult medical examination without abnormal findings: Secondary | ICD-10-CM | POA: Diagnosis not present

## 2017-01-04 LAB — CBC WITH DIFFERENTIAL/PLATELET
Basophils Absolute: 42 cells/uL (ref 0–200)
Basophils Relative: 1 %
Eosinophils Absolute: 42 cells/uL (ref 15–500)
Eosinophils Relative: 1 %
HCT: 41.8 % (ref 35.0–45.0)
Hemoglobin: 13.9 g/dL (ref 12.0–15.0)
Lymphocytes Relative: 34 %
Lymphs Abs: 1428 cells/uL (ref 850–3900)
MCH: 30.7 pg (ref 27.0–33.0)
MCHC: 33.3 g/dL (ref 32.0–36.0)
MCV: 92.3 fL (ref 80.0–100.0)
MPV: 9.4 fL (ref 7.5–12.5)
Monocytes Absolute: 546 cells/uL (ref 200–950)
Monocytes Relative: 13 %
Neutro Abs: 2142 cells/uL (ref 1500–7800)
Neutrophils Relative %: 51 %
Platelets: 227 10*3/uL (ref 140–400)
RBC: 4.53 MIL/uL (ref 3.80–5.10)
RDW: 13.3 % (ref 11.0–15.0)
WBC: 4.2 10*3/uL (ref 3.8–10.8)

## 2017-01-04 LAB — COMPREHENSIVE METABOLIC PANEL
ALBUMIN: 4.1 g/dL (ref 3.6–5.1)
ALT: 15 U/L (ref 6–29)
AST: 24 U/L (ref 10–35)
Alkaline Phosphatase: 55 U/L (ref 33–130)
BUN: 10 mg/dL (ref 7–25)
CHLORIDE: 101 mmol/L (ref 98–110)
CO2: 31 mmol/L (ref 20–31)
CREATININE: 0.77 mg/dL (ref 0.60–0.93)
Calcium: 9.4 mg/dL (ref 8.6–10.4)
GLUCOSE: 66 mg/dL — AB (ref 70–99)
Potassium: 4.2 mmol/L (ref 3.5–5.3)
SODIUM: 139 mmol/L (ref 135–146)
Total Bilirubin: 0.7 mg/dL (ref 0.2–1.2)
Total Protein: 6.9 g/dL (ref 6.1–8.1)

## 2017-01-04 LAB — LIPID PANEL
Cholesterol: 201 mg/dL — ABNORMAL HIGH (ref <200)
HDL: 79 mg/dL (ref >50)
LDL CALC: 107 mg/dL — AB (ref <100)
TRIGLYCERIDES: 76 mg/dL (ref <150)
Total CHOL/HDL Ratio: 2.5 Ratio (ref <5.0)
VLDL: 15 mg/dL (ref <30)

## 2017-01-04 NOTE — Patient Instructions (Signed)
I recommend eye visit once a year I recommend dental visit every 6 months Goal is to  Exercise 30 minutes 5 days a week We will send a letter with lab results  F/u 1 YEAR

## 2017-01-04 NOTE — Progress Notes (Signed)
Subjective:   Patient presents for Medicare Annual/Subsequent preventive examination.  Patient here for annual wellness exam. Her medications were reviewed She still following with cardiology for her chronic atrial fibrillation she has not had any hospitalizations recently. She is also followed by gastroenterology should've colonoscopy back in August which showed diverticulosis as well as internal Her hemorrhoids  History of Glaucoma followed by Dr. Renae Fickle every 6 months  Review Past Medical/Family/Social: Per EMR    Risk Factors  Current exercise habits: walks Dietary issues discussed: none of concern  Cardiac risk factors: A fib   Depression Screen  (Note: if answer to either of the following is "Yes", a more complete depression screening is indicated)  Over the past two weeks, have you felt down, depressed or hopeless? No Over the past two weeks, have you felt little interest or pleasure in doing things? No Have you lost interest or pleasure in daily life? No Do you often feel hopeless? No Do you cry easily over simple problems? No   Activities of Daily Living  In your present state of health, do you have any difficulty performing the following activities?:  Driving? No  Managing money? No  Feeding yourself? No  Getting from bed to chair? No  Climbing a flight of stairs? No  Preparing food and eating?: No  Bathing or showering? No  Getting dressed: No  Getting to the toilet? No  Using the toilet:No  Moving around from place to place: No  In the past year have you fallen or had a near fall?:No  Are you sexually active? No  Do you have more than one partner? No   Hearing Difficulties: No  Do you often ask people to speak up or repeat themselves? No  Do you experience ringing or noises in your ears? No Do you have difficulty understanding soft or whispered voices? No  Do you feel that you have a problem with memory? No Do you often misplace items? No  Do you feel safe  at home? Yes  Cognitive Testing  Alert? Yes Normal Appearance?Yes  Oriented to person? Yes Place? Yes  Time? Yes  Recall of three objects? Yes  Can perform simple calculations? Yes  Displays appropriate judgment?Yes  Can read the correct time from a watch face?Yes   List the Names of Other Physician/Practitioners you currently use:   Cardiology, GI, Optho  Screening Tests / Date Colonoscopy  UTD                    Zostavax Declines Bone Density- UTD  Pneumonia- UTD  Mammogram UTD Jan 2016 Influenza Vaccine UTD Tetanus/tdap UTD  ROS: GEN- denies fatigue, fever, weight loss,weakness, recent illness HEENT- denies eye drainage, change in vision, nasal discharge, CVS- denies chest pain, palpitations RESP- denies SOB, cough, wheeze ABD- denies N/V, change in stools, abd pain GU- denies dysuria, hematuria, dribbling, incontinence MSK- denies joint pain, muscle aches, injury Neuro- denies headache, dizziness, syncope, seizure activity  PHYSICAL: GEN- NAD, alert and oriented x3 HEENT- PERRL, EOMI, non injected sclera, pink conjunctiva, MMM, oropharynx clear Neck- Supple, no thryomegaly, no bruit  CVS- RRR, no murmur RESP-CTAB ABD-NABS,soft,NT,ND  EXT- No edema Pulses- Radial, DP- 2+       Assessment:    Annual wellness medicare exam   Plan:    During the course of the visit the patient was educated and counseled about appropriate screening and preventive services including:  Shingles vaccine.Pt had shingles declines further  vaccination  Screen Neg for depression.   Blood pressure controlled, rate controlled for  A fib, continue follow up with cardiology.   she is doing quite well at home with husband.    F/U 1 year for physical   Diet review for nutrition referral? Yes ____ Not Indicated __x__  Patient Instructions (the written plan) was given to the patient.  Medicare Attestation  I have personally reviewed:  The patient's medical and social  history  Their use of alcohol, tobacco or illicit drugs  Their current medications and supplements  The patient's functional ability including ADLs,fall risks, home safety risks, cognitive, and hearing and visual impairment  Diet and physical activities  Evidence for depression or mood disorders  The patient's weight, height, BMI, and visual acuity have been recorded in the chart. I have made referrals, counseling, and provided education to the patient based on review of the above and I have provided the patient with a written personalized care plan for preventive services.

## 2017-01-07 ENCOUNTER — Encounter: Payer: Self-pay | Admitting: *Deleted

## 2017-03-12 ENCOUNTER — Encounter: Payer: Self-pay | Admitting: Family Medicine

## 2017-03-12 ENCOUNTER — Ambulatory Visit (INDEPENDENT_AMBULATORY_CARE_PROVIDER_SITE_OTHER): Payer: Medicare Other | Admitting: Family Medicine

## 2017-03-12 VITALS — BP 120/66 | HR 62 | Temp 98.4°F | Resp 14 | Ht 65.0 in | Wt 113.0 lb

## 2017-03-12 DIAGNOSIS — N390 Urinary tract infection, site not specified: Secondary | ICD-10-CM | POA: Diagnosis not present

## 2017-03-12 LAB — URINALYSIS, ROUTINE W REFLEX MICROSCOPIC
BILIRUBIN URINE: NEGATIVE
Glucose, UA: NEGATIVE
Ketones, ur: NEGATIVE
Nitrite: NEGATIVE
PROTEIN: NEGATIVE
SPECIFIC GRAVITY, URINE: 1.01 (ref 1.001–1.035)
pH: 7 (ref 5.0–8.0)

## 2017-03-12 LAB — URINALYSIS, MICROSCOPIC ONLY
CRYSTALS: NONE SEEN [HPF]
Casts: NONE SEEN [LPF]
Yeast: NONE SEEN [HPF]

## 2017-03-12 MED ORDER — SULFAMETHOXAZOLE-TRIMETHOPRIM 800-160 MG PO TABS
1.0000 | ORAL_TABLET | Freq: Two times a day (BID) | ORAL | 0 refills | Status: DC
Start: 2017-03-12 — End: 2018-01-05

## 2017-03-12 MED ORDER — PHENAZOPYRIDINE HCL 100 MG PO TABS: 100.0000 mg | ORAL_TABLET | Freq: Three times a day (TID) | ORAL | 0 refills | Status: DC | PRN

## 2017-03-12 NOTE — Progress Notes (Signed)
   Subjective:    Patient ID: Kelly Dougherty, female    DOB: [birth year], 76 y.o.   MRN: [id number]  Patient presents for Dysuria (x1 day- urinary urgency, frequency, burning with urination)  Pt here with dysuria that started yesterday states she gets UTI  once a year often treated at [facility]. Last treated 1 year ago.  has had some urinary frequency/urgency and incontinence since she can't make it. Bladder and bowels were normal before yesterday No fever no abdominal pain    Review Of Systems:  GEN- denies fatigue, fever, weight loss,weakness, recent illness HEENT- denies eye drainage, change in vision, nasal discharge, CVS- denies chest pain, palpitations RESP- denies SOB, cough, wheeze ABD- denies N/V, change in stools, abd pain GU- + dysuria, denieshematuria, dribbling, incontinence MSK- denies joint pain, muscle aches, injury Neuro- denies headache, dizziness, syncope, seizure activity       Objective:    BP 120/66   Pulse 62   Temp 98.4 F (36.9 C) (Oral)   Resp 14   Ht 5\' 5"  (1.651 m)   Wt 113 lb (51.3 kg)   SpO2 99%   BMI 18.80 kg/m  GEN- NAD, alert and oriented x3 HEENT-, MMM, oropharynx clear CVS- RRR, no murmur RESP-CTAB ABD-NABS,soft,NT,ND, no CVA tenderness Pulses- Radial  2+        Assessment & Plan:      Problem List Items Addressed This Visit    None    Visit Diagnoses    Urinary tract infection without hematuria, site unspecified    -  Primary   Start bactrim and pyridium, urine sent for culture continue water , cranberry   Relevant Medications   sulfamethoxazole-trimethoprim (BACTRIM DS,SEPTRA DS) 800-160 MG tablet   phenazopyridine (PYRIDIUM) 100 MG tablet   Other Relevant Orders   Urinalysis, Routine w reflex microscopic   Urine culture      Note: This dictation was prepared with Dragon dictation along with smaller phrase technology. Any transcriptional errors that result from this process are unintentional.

## 2017-03-12 NOTE — Patient Instructions (Signed)
f/u As needed

## 2017-03-14 LAB — URINE CULTURE: ORGANISM ID, BACTERIA: NO GROWTH

## 2017-05-12 ENCOUNTER — Ambulatory Visit (INDEPENDENT_AMBULATORY_CARE_PROVIDER_SITE_OTHER): Payer: Medicare Other | Admitting: Cardiovascular Disease

## 2017-05-12 ENCOUNTER — Encounter: Payer: Self-pay | Admitting: Cardiovascular Disease

## 2017-05-12 VITALS — BP 124/64 | HR 63 | Ht 65.0 in | Wt 108.0 lb

## 2017-05-12 DIAGNOSIS — I4819 Other persistent atrial fibrillation: Secondary | ICD-10-CM

## 2017-05-12 DIAGNOSIS — I38 Endocarditis, valve unspecified: Secondary | ICD-10-CM | POA: Diagnosis not present

## 2017-05-12 DIAGNOSIS — I519 Heart disease, unspecified: Secondary | ICD-10-CM

## 2017-05-12 DIAGNOSIS — I5189 Other ill-defined heart diseases: Secondary | ICD-10-CM

## 2017-05-12 DIAGNOSIS — I481 Persistent atrial fibrillation: Secondary | ICD-10-CM

## 2017-05-12 DIAGNOSIS — I429 Cardiomyopathy, unspecified: Secondary | ICD-10-CM

## 2017-05-12 NOTE — Progress Notes (Signed)
SUBJECTIVE: The patient presents for follow-up of atrial fibrillation cardiomyopathy. Echocardiogram on 05/01/16 demonstrated normalization of left ventricular systolic function, LVEF 31-54%, with grade 2 diastolic dysfunction. There was mild to moderate left atrial enlargement and mild mitral, mild aortic, and moderate tricuspid regurgitation. There was mild right atrial enlargement.  ECG performed in the office today which I ordered and personally interpreted demonstrates normal sinus rhythm with no ischemic ST segment or T-wave abnormalities, nor any arrhythmias.  The patient denies any symptoms of chest pain, shortness of breath, lightheadedness, dizziness, orthopnea, PND, and syncope.  She very seldom has palpitations. She has some feet swelling at the end of the day primarily in the hot humid weather after she has been very active outdoors all day long.    Soc: Married in 1959. Husband is a retired Marine scientist and studied at [employer]. 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 3 living children (one son is international Biomedical engineer for [employer] in [place]), 1 son committed suicide.  Review of Systems: As per "subjective", otherwise negative.  No Known Allergies  Current Outpatient Prescriptions  Medication Sig Dispense Refill  . calcium-vitamin D (OSCAL WITH D) 500-200 MG-UNIT tablet Take 1 tablet by mouth.    . carbamide peroxide (DEBROX) 6.5 % otic solution Place 5 drops into the right ear 2 (two) times daily. FOR  3 days 15 mL 0  . Docusate Calcium (STOOL SOFTENER PO) Take by mouth.    . dronedarone (MULTAQ) 400 MG tablet Take 1 tablet (400 mg total) by mouth 2 (two) times daily with a meal. 60 tablet 11  . ELIQUIS 5 MG TABS tablet TAKE ONE TABLET BY MOUTH TWICE DAILY 60 tablet 6  . furosemide (LASIX) 20 MG tablet Take 40 mg for 3 days starting today 08/06/15, and the take 20 mg daily prn as needed for swelling 90 tablet 3  . latanoprost  (XALATAN) 0.005 % ophthalmic solution Place 1 drop into both eyes at bedtime.     . phenazopyridine (PYRIDIUM) 100 MG tablet Take 1 tablet (100 mg total) by mouth 3 (three) times daily as needed for pain. 10 tablet 0  . potassium chloride (K-DUR) 10 MEQ tablet Take 20 meq (2 tabs) starting today 08/06/15 for the next 3 days and then take 10 meq only on days you taking Lasix for swelling 90 tablet 3  . Psyllium (METAMUCIL PO) Take by mouth daily.     Marland Kitchen sulfamethoxazole-trimethoprim (BACTRIM DS,SEPTRA DS) 800-160 MG tablet Take 1 tablet by mouth 2 (two) times daily. 14 tablet 0  . timolol (TIMOPTIC) 0.5 % ophthalmic solution Place 1 drop into the left eye 2 (two) times daily.     No current facility-administered medications for this visit.     Past Medical History:  Diagnosis Date  . Arthritis   . Atrial fibrillation ([place])   . Cataract   . Dysrhythmia    04/2015 with atrial fibrillation  . Glaucoma     Past Surgical History:  Procedure Laterality Date  . APPENDECTOMY     76 years of age  . CARDIOVERSION N/A 10/03/2015   Procedure: CARDIOVERSION;  Surgeon: Herminio Commons, MD;  Location: [facility] ORS;  Service: Cardiovascular;  Laterality: N/A;  . COLONOSCOPY N/A 07/30/2016   Procedure: COLONOSCOPY;  Surgeon: Rogene Houston, MD;  Location: [facility] END[facility] SUITE;  Service: Endoscopy;  Laterality: N/A;  8:30  . EYE SURGERY     left  .  HEMORROIDECTOMY    . TONSILLECTOMY     childhood    Social History   Social History  . Marital status: Married    Spouse name: N/A  . Number of children: N/A  . Years of education: N/A   Occupational History  . Not on file.   Social History Main Topics  . Smoking status: Never Smoker  . Smokeless tobacco: Never Used  . Alcohol use 4.2 oz/week    7 Glasses of wine per week     Comment: one glass of wine with dinner each night  . Drug use: No  . Sexual activity: Not Currently   Other Topics Concern  . Not on file   Social History Narrative  . No  narrative on file     Vitals:   05/12/17 1059  BP: 124/64  Pulse: 63  SpO2: 98%  Weight: 108 lb (49 kg)  Height: 5\' 5"  (1.651 m)    Wt Readings from Last 3 Encounters:  05/12/17 108 lb (49 kg)  03/12/17 113 lb (51.3 kg)  01/04/17 114 lb (51.7 kg)     PHYSICAL EXAM General: NAD HEENT: Normal. Neck: No JVD, no thyromegaly. Lungs: Clear to auscultation bilaterally with normal respiratory effort. CV: Nondisplaced PMI.  Regular rate and rhythm, normal S1/S2, no S3/S4, no murmur. No pretibial or periankle edema.  No carotid bruit.   Abdomen: Soft, nontender, no distention.  Neurologic: Alert and oriented.  Psych: Normal affect. Skin: Normal. Musculoskeletal: No gross deformities.    ECG: Most recent ECG reviewed.   Labs: Lab Results  Component Value Date/Time   K 4.2 01/04/2017 08:54 AM   BUN 10 01/04/2017 08:54 AM   CREATININE 0.77 01/04/2017 08:54 AM   ALT 15 01/04/2017 08:54 AM   TSH 1.916 10/29/2015 10:20 AM   HGB 13.9 01/04/2017 08:54 AM     Lipids: Lab Results  Component Value Date/Time   LDLCALC 107 (H) 01/04/2017 08:54 AM   CHOL 201 (H) 01/04/2017 08:54 AM   TRIG 76 01/04/2017 08:54 AM   HDL 79 01/04/2017 08:54 AM       ASSESSMENT AND PLAN: 1. Atrial fibrillation: Maintaining sinus rhythm on Multaq. Anticoagulated with Eliquis. Underwent successful cardioversion in 2016.  2. Cardiomyopathy: Left ventricular systolic function normalized in May 2017. Echocardiogram reviewed above. It was probably tachycardia mediated. Continue present therapy.  3. Valvular heart disease: This remains stable. No orthopnea or pretibial edema. Echocardiogram from May 2017 reviewed above.  4. Grade 2 diastolic dysfunction: No diuretic requirement at this time. BP is normal.   Disposition: Follow up 1 year.  Kate Sable, M.D., F.A.C.C.

## 2017-05-12 NOTE — Patient Instructions (Signed)

## 2017-06-14 ENCOUNTER — Other Ambulatory Visit: Payer: Self-pay | Admitting: Cardiovascular Disease

## 2017-09-20 ENCOUNTER — Other Ambulatory Visit: Payer: Self-pay | Admitting: Cardiovascular Disease

## 2018-01-05 ENCOUNTER — Ambulatory Visit (INDEPENDENT_AMBULATORY_CARE_PROVIDER_SITE_OTHER): Payer: Medicare Other | Admitting: Family Medicine

## 2018-01-05 ENCOUNTER — Other Ambulatory Visit: Payer: Self-pay

## 2018-01-05 ENCOUNTER — Encounter: Payer: Self-pay | Admitting: Family Medicine

## 2018-01-05 VITALS — BP 126/58 | HR 64 | Temp 98.0°F | Resp 14 | Ht 65.0 in | Wt 111.0 lb

## 2018-01-05 DIAGNOSIS — H409 Unspecified glaucoma: Secondary | ICD-10-CM | POA: Diagnosis not present

## 2018-01-05 DIAGNOSIS — E785 Hyperlipidemia, unspecified: Secondary | ICD-10-CM | POA: Diagnosis not present

## 2018-01-05 DIAGNOSIS — I482 Chronic atrial fibrillation, unspecified: Secondary | ICD-10-CM

## 2018-01-05 DIAGNOSIS — Z0000 Encounter for general adult medical examination without abnormal findings: Secondary | ICD-10-CM | POA: Diagnosis not present

## 2018-01-05 LAB — CBC WITH DIFFERENTIAL/PLATELET
BASOS ABS: 41 {cells}/uL (ref 0–200)
BASOS PCT: 1 %
EOS ABS: 62 {cells}/uL (ref 15–500)
Eosinophils Relative: 1.5 %
HCT: 40.8 % (ref 35.0–45.0)
Hemoglobin: 13.7 g/dL (ref 11.7–15.5)
Lymphs Abs: 1484 cells/uL (ref 850–3900)
MCH: 30.6 pg (ref 27.0–33.0)
MCHC: 33.6 g/dL (ref 32.0–36.0)
MCV: 91.1 fL (ref 80.0–100.0)
MPV: 9.8 fL (ref 7.5–12.5)
Monocytes Relative: 14.2 %
NEUTROS PCT: 47.1 %
Neutro Abs: 1931 cells/uL (ref 1500–7800)
PLATELETS: 227 10*3/uL (ref 140–400)
RBC: 4.48 10*6/uL (ref 3.80–5.10)
RDW: 12.1 % (ref 11.0–15.0)
TOTAL LYMPHOCYTE: 36.2 %
WBC: 4.1 10*3/uL (ref 3.8–10.8)
WBCMIX: 582 {cells}/uL (ref 200–950)

## 2018-01-05 LAB — COMPREHENSIVE METABOLIC PANEL
AG Ratio: 1.6 (calc) (ref 1.0–2.5)
ALKALINE PHOSPHATASE (APISO): 69 U/L (ref 33–130)
ALT: 19 U/L (ref 6–29)
AST: 24 U/L (ref 10–35)
Albumin: 4.1 g/dL (ref 3.6–5.1)
BUN: 11 mg/dL (ref 7–25)
CHLORIDE: 100 mmol/L (ref 98–110)
CO2: 28 mmol/L (ref 20–32)
CREATININE: 0.82 mg/dL (ref 0.60–0.93)
Calcium: 9.1 mg/dL (ref 8.6–10.4)
GLUCOSE: 68 mg/dL (ref 65–99)
Globulin: 2.5 g/dL (calc) (ref 1.9–3.7)
Potassium: 4.1 mmol/L (ref 3.5–5.3)
Sodium: 137 mmol/L (ref 135–146)
Total Bilirubin: 0.6 mg/dL (ref 0.2–1.2)
Total Protein: 6.6 g/dL (ref 6.1–8.1)

## 2018-01-05 LAB — LIPID PANEL
CHOL/HDL RATIO: 2.7 (calc) (ref <5.0)
CHOLESTEROL: 198 mg/dL (ref <200)
HDL: 74 mg/dL (ref >50)
LDL CHOLESTEROL (CALC): 106 mg/dL — AB
Non-HDL Cholesterol (Calc): 124 mg/dL (calc) (ref <130)
TRIGLYCERIDES: 88 mg/dL (ref <150)

## 2018-01-05 NOTE — Progress Notes (Signed)
Subjective:   Patient presents for Medicare Annual/Subsequent preventive examination.    Pt here for annual wellness. Medications reviewed  PAF- no dfficulties on Multaq and Eliquis , no bleeding problems   THi smorning had some fresh fruit and small banana muffin   Review Past Medical/Family/Social: pER  EMR   Risk Factors  Current exercise habits: walks Dietary issues discussed: none of concern  Cardiac risk factors: A fib  Depression Screen  (Note: if answer to either of the following is "Yes", a more complete depression screening is indicated)  Over the past two weeks, have you felt down, depressed or hopeless? No Over the past two weeks, have you felt little interest or pleasure in doing things? No Have you lost interest or pleasure in daily life? No Do you often feel hopeless? No Do you cry easily over simple problems? No   Activities of Daily Living  In your present state of health, do you have any difficulty performing the following activities?:  Driving? No  Managing money? No  Feeding yourself? No  Getting from bed to chair? No  Climbing a flight of stairs? No  Preparing food and eating?: No  Bathing or showering? No  Getting dressed: No  Getting to the toilet? No  Using the toilet:No  Moving around from place to place: No  In the past year have you fallen or had a near fall?:No  Are you sexually active? No  Do you have more than one partner? No   Hearing Difficulties: No  Do you often ask people to speak up or repeat themselves? No  Do you experience ringing or noises in your ears? No Do you have difficulty understanding soft or whispered voices? No  Do you feel that you have a problem with memory? Occasionally forgets things  Do you often misplace items? No  Do you feel safe at home? Yes  Cognitive Testing  Alert? Yes Normal Appearance?Yes  Oriented to person? Yes Place? Yes  Time? Yes  Recall of three objects? Yes  Can perform simple calculations?  Yes  Displays appropriate judgment?Yes  Can read the correct time from a watch face?Yes   List the Names of Other Physician/Practitioners you currently use:  Ophthalmology- Dr. Jene Every- for Glaucoma, every 6 months maintained on drops  Cardiology- for chronic A fib- seen once a year. Dr. Jacinta Shoe   Screening Tests / Date Colonoscopy     UTD                 Zostavax - due  Pneumonia- UTD  Mammogram UTD- finished getting mammograms  Influenza Vaccine UTD  Tetanus/tdap UTD   ROS: GEN- denies fatigue, fever, weight loss,weakness, recent illness HEENT- denies eye drainage, change in vision, nasal discharge, CVS- denies chest pain, palpitations RESP- denies SOB, cough, wheeze ABD- denies N/V, change in stools, abd pain GU- denies dysuria, hematuria, dribbling, incontinence MSK- denies joint pain, muscle aches, injury Neuro- denies headache, dizziness, syncope, seizure activity  PHYSICAL: GEN- NAD, alert and oriented x3 HEENT- PERRL, EOMI, non injected sclera, pink conjunctiva, MMM, oropharynx clear Neck- Supple, no thryomegaly, no bruit  CVS- RRR, no murmur RESP-CTAB ABD-NABS,soft,NT,ND  EXT- No edema Pulses- Radial, DP- 2+   Assessment:    Annual wellness medicare exam   Plan:    During the course of the visit the patient was educated and counseled about appropriate screening and preventive services including:  Prevention UTD  Screen neg for depression  Maintaining Weight, good appetite, staying active  BP controlled   A fib- controlled f/u with cardiology Non Fasting labs done today  Has advanced directives   Diet review for nutrition referral? Yes ____ Not Indicated __x__  Patient Instructions (the written plan) was given to the patient.  Medicare Attestation  I have personally reviewed:  The patient's medical and social history  Their use of alcohol, tobacco or illicit drugs  Their current medications and supplements  The patient's functional ability  including ADLs,fall risks, home safety risks, cognitive, and hearing and visual impairment  Diet and physical activities  Evidence for depression or mood disorders  The patient's weight, height, BMI, and visual acuity have been recorded in the chart. I have made referrals, counseling, and provided education to the patient based on review of the above and I have provided the patient with a written personalized care plan for preventive services.

## 2018-01-05 NOTE — Patient Instructions (Signed)
We will call with lab results Shingles vaccine to be given  F/U 1 year for PHYSICAL

## 2018-01-07 ENCOUNTER — Encounter: Payer: Self-pay | Admitting: *Deleted

## 2018-01-12 ENCOUNTER — Ambulatory Visit (HOSPITAL_COMMUNITY)
Admission: RE | Admit: 2018-01-12 | Discharge: 2018-01-12 | Disposition: A | Discharge status: Home / Self Care | Payer: Medicare Other | Source: Ambulatory Visit | Attending: Physician Assistant | Admitting: Physician Assistant

## 2018-01-12 ENCOUNTER — Encounter: Payer: Self-pay | Admitting: Physician Assistant

## 2018-01-12 ENCOUNTER — Other Ambulatory Visit (HOSPITAL_COMMUNITY)
Admission: RE | Admit: 2018-01-12 | Discharge: 2018-01-12 | Disposition: A | Discharge status: Home / Self Care | Payer: Medicare Other | Source: Ambulatory Visit | Attending: Physician Assistant | Admitting: Physician Assistant

## 2018-01-12 ENCOUNTER — Ambulatory Visit: Payer: Medicare Other | Admitting: Physician Assistant

## 2018-01-12 VITALS — BP 120/62 | HR 68 | Ht 65.0 in | Wt 114.0 lb

## 2018-01-12 DIAGNOSIS — I429 Cardiomyopathy, unspecified: Secondary | ICD-10-CM

## 2018-01-12 DIAGNOSIS — R011 Cardiac murmur, unspecified: Secondary | ICD-10-CM | POA: Insufficient documentation

## 2018-01-12 DIAGNOSIS — I4891 Unspecified atrial fibrillation: Secondary | ICD-10-CM | POA: Diagnosis not present

## 2018-01-12 DIAGNOSIS — R0602 Shortness of breath: Secondary | ICD-10-CM | POA: Insufficient documentation

## 2018-01-12 LAB — TSH: TSH: 2.404 u[IU]/mL (ref 0.350–4.500)

## 2018-01-12 MED ORDER — FUROSEMIDE 20 MG PO TABS: ORAL_TABLET | ORAL | 3 refills | Status: DC

## 2018-01-12 MED ORDER — POTASSIUM CHLORIDE ER 10 MEQ PO TBCR: EXTENDED_RELEASE_TABLET | ORAL | 3 refills | Status: DC

## 2018-01-12 NOTE — Progress Notes (Signed)
Cardiology Office Note    Date:  01/12/2018   ID:  Kelly Dougherty, DOB 1941/04/30, MRN [id number]  PCP:  Alycia Rossetti, MD  Cardiologist: Kate Sable, MD  No chief complaint on file.   History of Present Illness:  Kelly Dougherty is a 77 y.o. female with history of PAF managed with Multitak and Eliquis status post cardioversion in 2016, previous tachycardia mediated cardiomyopathy resolved, chronic diastolic CHF, echo 03/3328 normal LVEF 55-60% with grade 2 DD mild to moderate left atrial enlargement and mild MR, mild aortic and moderate tricuspid regurgitation.  Last saw Dr. Bronson Ing 04/2017 and was in normal sinus rhythm.  Right before Thanksgiving had brief episodes of racing that would go away. Now she is having racing off/on daily, short of breath, fatigue just like she felt in the past. Also awakens her at night.Has been going on for several weeks. Takes meds regularly. No edema and hasn't used lasix. Drinks 2 cups coffee/1 tea daily. Blood work 01/05/18 all looks good. LDL 106-no TSH done.    Past Medical History:  Diagnosis Date  . Arthritis   . Atrial fibrillation ([place])   . Cataract   . Dysrhythmia    04/2015 with atrial fibrillation  . Glaucoma     Past Surgical History:  Procedure Laterality Date  . APPENDECTOMY     77 years of age  . CARDIOVERSION N/A 10/03/2015   Procedure: CARDIOVERSION;  Surgeon: Herminio Commons, MD;  Location: [facility];  Service: Cardiovascular;  Laterality: N/A;  . COLONOSCOPY N/A 07/30/2016   Procedure: COLONOSCOPY;  Surgeon: Rogene Houston, MD;  Location: [facility];  Service: Endoscopy;  Laterality: N/A;  8:30  . EYE SURGERY     left  . HEMORROIDECTOMY    . TONSILLECTOMY     childhood    Current Medications: Current Meds  Medication Sig  . calcium-vitamin D (OSCAL WITH D) 500-200 MG-UNIT tablet Take 1 tablet by mouth.  . carbamide peroxide (DEBROX) 6.5 % otic solution Place 5 drops into the right ear 2 (two) times daily.  FOR  3 days  . Docusate Calcium (STOOL SOFTENER PO) Take by mouth.  Arne Cleveland 5 MG TABS tablet TAKE ONE TABLET BY MOUTH TWICE DAILY  . furosemide (LASIX) 20 MG tablet Take 40 mg for 3 days starting today 08/06/15, and the take 20 mg daily prn as needed for swelling  . latanoprost (XALATAN) 0.005 % ophthalmic solution Place 1 drop into both eyes at bedtime.   . MULTAQ 400 MG tablet TAKE ONE TABLET BY MOUTH TWICE DAILY WITH A MEAL  . potassium chloride (K-DUR) 10 MEQ tablet Take 20 meq (2 tabs) starting today 08/06/15 for the next 3 days and then take 10 meq only on days you taking Lasix for swelling  . timolol (TIMOPTIC) 0.5 % ophthalmic solution Place 1 drop into the left eye 2 (two) times daily.     Allergies:   Patient has no known allergies.   Social History   Socioeconomic History  . Marital status: Married    Spouse name: None  . Number of children: None  . Years of education: None  . Highest education level: None  Social Needs  . Financial resource strain: None  . Food insecurity - worry: None  . Food insecurity - inability: None  . Transportation needs - medical: None  . Transportation needs - non-medical: None  Occupational History  . None  Tobacco Use  . Smoking status: Never Smoker  .  Smokeless tobacco: Never Used  Substance and Sexual Activity  . Alcohol use: Yes    Alcohol/week: 4.2 oz    Types: 7 Glasses of wine per week    Comment: one glass of wine with dinner each night  . Drug use: No  . Sexual activity: Not Currently  Other Topics Concern  . None  Social History Narrative  . None     Family History:  The patient's family history includes Alzheimer's disease in her father; Hearing loss in her mother.   ROS:   Please see the history of present illness.    Review of Systems  Constitution: Negative.  HENT: Negative.   Eyes: Negative.   Cardiovascular: Negative.   Respiratory: Negative.   Hematologic/Lymphatic: Negative.   Musculoskeletal: Negative.   Negative for joint pain.  Gastrointestinal: Negative.   Genitourinary: Negative.   Neurological: Negative.    All other systems reviewed and are negative.   PHYSICAL EXAM:   VS:  BP 120/62 (BP Location: Left Arm)   Pulse 68   Ht _0  (1.651 m)   Wt 114 lb (51.7 kg)   SpO2 95%   BMI 18.97 kg/m   Physical Exam  GEN: Well nourished, well developed, in no acute distress  Neck: no JVD, carotid bruits, or masses Cardiac:RRR; some skipping, 2/6 systolic murmur the left sternal border, no rubs, or gallops  Respiratory:  clear to auscultation bilaterally, normal work of breathing GI: soft, nontender, nondistended, + BS Ext: without cyanosis, clubbing, or edema, Good distal pulses bilaterally MS: no deformity or atrophy  Skin: warm and dry, no rash Neuro:  Alert and Oriented x 3, Strength and sensation are intact Psych: Does not feel well  Wt Readings from Last 3 Encounters:  01/12/18 114 lb (51.7 kg)  01/05/18 111 lb (50.3 kg)  05/12/17 108 lb (49 kg)      Studies/Labs Reviewed:   EKG:  EKG is  ordered today.  The ekg ordered today demonstrates normal sinus rhythm at 61 bpm  Recent Labs: 01/05/2018: ALT 19; BUN 11; Creat 0.82; Hemoglobin 13.7; Platelets 227; Potassium 4.1; Sodium 137   Lipid Panel    Component Value Date/Time   CHOL 198 01/05/2018 0843   TRIG 88 01/05/2018 0843   HDL 74 01/05/2018 0843   CHOLHDL 2.7 01/05/2018 0843   VLDL 15 01/04/2017 0854   LDLCALC 107 (H) 01/04/2017 0854    Additional studies/ records that were reviewed today include:  2D echo 2017Study Conclusions   - Left ventricle: The cavity size was normal. Wall thickness was   normal. Systolic function was normal. The estimated ejection   fraction was in the range of 55% to 60%. Wall motion was normal;   there were no regional wall motion abnormalities. Features are   consistent with a pseudonormal left ventricular filling pattern,   with concomitant abnormal relaxation and increased  filling   pressure (grade 2 diastolic dysfunction). - Aortic valve: There was mild regurgitation. - Mitral valve: Calcified annulus. There was mild regurgitation. - Left atrium: The atrium was mildly to moderately dilated. - Right atrium: The atrium was mildly dilated. Central venous   pressure (est): 3 mm Hg. - Tricuspid valve: There was moderate regurgitation. - Pulmonary arteries: PA peak pressure: 39 mm Hg (S). - Pericardium, extracardiac: There was no pericardial effusion.   Impressions:   - Normal LV wall thickness with LVEF 34-19%, grade 2 diastolic   dysfunction with intermediate LV filling pressure. There has been  improvement in both LV and RV function compared to the previous   study in May 2016. Mild to moderate left atrial enlargement. MAC   with mild mitral regurgitation. Mild aortic regurgitation.   Moderate tricuspid regurgitation with PASP 39 mmHg. Mild right   atrial enlargement.  2D echo 2016Study Conclusions  - Left ventricle: The cavity size was normal. Wall thickness was   normal. Systolic function was mildly reduced. The estimated   ejection fraction was in the range of 45% to 50%. Diffuse   hypokinesis. The study is not technically sufficient to allow   evaluation of LV diastolic function. - Aortic valve: There was mild regurgitation. - Mitral valve: Calcified annulus. Mildly thickened leaflets .   There was mild regurgitation. - Left atrium: The atrium was moderately dilated. - Right ventricle: Systolic function was mildly reduced. - Right atrium: The atrium was moderately to severely dilated.   Central venous pressure (est): 8 mm Hg. - Atrial septum: No defect or patent foramen ovale was identified. - Tricuspid valve: There was moderate regurgitation. - Pulmonary arteries: PA peak pressure: 33 mm Hg (S). - Pericardium, extracardiac: There was no pericardial effusion.  Impressions:  - Normal LV wall thickness with LVEF 45-50% and diffuse    hypokinesis, indeterminate diastolic function. Moderate left   atrial enlargement. Mild mitral regurgitation. Mild aortic   regurgitation. Mildly reduced RV contraction. Moderate tricuspid   regurgitation with PASP 33 mmHg. Moderate to severe right atrial   enlargement.     ASSESSMENT:    1. Atrial fibrillation, unspecified type ([place])   2. Cardiomyopathy, unspecified type ([place])   3. SOB (shortness of breath)      PLAN:  In order of problems listed above:  Atrial fibrillation paroxysmal has maintained normal sinus rhythm since 2016 on Multitak and Eliquis.  Patient is now having daily tachycardia and symptoms similar to 2016.  She is currently in normal sinus rhythm.  6 suspect she is having recurrent bursts of rapid atrial fibrillation.  We will place 48-hour monitor, check TSH, other labs all normal.  Decrease caffeine.  Follow-up with Dr. Bronson Ing in 1-2 weeks  History of tachycardia induced cardiomyopathy in 2016 with EF 45-50% normalized in 2017 EF 55-60%.  Grade 2 DD.  Now his symptoms of increased dyspnea and fatigue.  We will repeat 2D echo   Dyspnea even with talking.  No evidence of CHF on exam.  With history of cardiomyopathy in the past we will repeat 2D echo.  I told her to go ahead and try to take Lasix and potassium to see if it makes her feel better.  Heart murmurs history of moderate TR mild AI and mild MR.  Repeating 2D echo.  Medication Adjustments/Labs and Tests Ordered: Current medicines are reviewed at length with the patient today.  Concerns regarding medicines are outlined above.  Medication changes, Labs and Tests ordered today are listed in the Patient Instructions below. Patient Instructions  Medication Instructions:  Your physician has recommended you make the following change in your medication:  Start Lopressor 12.5 mg As needed    Labwork: Your physician recommends that you return for lab work Today    Testing/Procedures: Your physician has  requested that you have an echocardiogram. Echocardiography is a painless test that uses sound waves to create images of your heart. It provides your doctor with information about the size and shape of your heart and how well your heart's chambers and valves are working. This procedure takes approximately one hour.  There are no restrictions for this procedure.    Follow-Up: Your physician recommends that you schedule a follow-up appointment in: 1 Week    Any Other Special Instructions Will Be Listed Below (If Applicable).     If you need a refill on your cardiac medications before your next appointment, please call your pharmacy. Thank you for choosing [facility]!      Sumner Boast, PA-C  01/12/2018 11:35 AM    [facility] [place], [place], [place]  [address] Phone: [phone]; Fax: [phone]

## 2018-01-12 NOTE — Patient Instructions (Signed)
Medication Instructions:  Your physician has recommended you make the following change in your medication:  Start Lopressor 12.5 mg As needed    Labwork: Your physician recommends that you return for lab work Today    Testing/Procedures: Your physician has requested that you have an echocardiogram. Echocardiography is a painless test that uses sound waves to create images of your heart. It provides your doctor with information about the size and shape of your heart and how well your heart's chambers and valves are working. This procedure takes approximately one hour. There are no restrictions for this procedure.    Follow-Up: Your physician recommends that you schedule a follow-up appointment in: 1 Week    Any Other Special Instructions Will Be Listed Below (If Applicable).     If you need a refill on your cardiac medications before your next appointment, please call your pharmacy. Thank you for choosing [facility]!

## 2018-01-14 ENCOUNTER — Ambulatory Visit (HOSPITAL_COMMUNITY)
Admission: RE | Admit: 2018-01-14 | Discharge: 2018-01-14 | Disposition: A | Discharge status: Home / Self Care | Payer: Medicare Other | Source: Ambulatory Visit | Attending: Physician Assistant | Admitting: Physician Assistant

## 2018-01-14 DIAGNOSIS — I4891 Unspecified atrial fibrillation: Secondary | ICD-10-CM

## 2018-01-14 DIAGNOSIS — E785 Hyperlipidemia, unspecified: Secondary | ICD-10-CM | POA: Diagnosis not present

## 2018-01-14 DIAGNOSIS — R0602 Shortness of breath: Secondary | ICD-10-CM | POA: Diagnosis not present

## 2018-01-14 DIAGNOSIS — I083 Combined rheumatic disorders of mitral, aortic and tricuspid valves: Secondary | ICD-10-CM | POA: Diagnosis not present

## 2018-01-14 DIAGNOSIS — I429 Cardiomyopathy, unspecified: Secondary | ICD-10-CM

## 2018-01-14 LAB — ECHOCARDIOGRAM COMPLETE
AVLVOTPG: 3 mmHg
CHL CUP DOP CALC LVOT VTI: 19.6 cm
CHL CUP STROKE VOLUME: 41 mL
CHL CUP TV REG PEAK VELOCITY: 285 cm/s
EERAT: 9.49
EWDT: 194 ms
FS: 43 % (ref 28–44)
IV/PV OW: 1.07
LA ID, A-P, ES: 37 mm
LA vol A4C: 62.4 ml
LA vol index: 36.3 mL/m2
LADIAMINDEX: 2.41 cm/m2
LAVOL: 55.6 mL
LEFT ATRIUM END SYS DIAM: 37 mm
LV E/e' medial: 9.49
LV E/e'average: 9.49
LV SIMPSON'S DISK: 69
LV TDI E'LATERAL: 10
LV dias vol: 59 mL (ref 46–106)
LVDIAVOLIN: 38 mL/m2
LVELAT: 10 cm/s
LVOT SV: 44 mL
LVOT area: 2.27 cm2
LVOTD: 17 mm
LVOTPV: 80.9 cm/s
LVSYSVOL: 18 mL (ref 14–42)
LVSYSVOLIN: 12 mL/m2
MV Dec: 194
MV Peak grad: 4 mmHg
MV pk E vel: 94.9 m/s
MVPKAVEL: 43.5 m/s
P 1/2 time: 743 ms
PW: 8.41 mm — AB (ref 0.6–1.1)
RV LATERAL S' VELOCITY: 14.4 cm/s
RV TAPSE: 20.8 mm
RV sys press: 40 mmHg
TDI e' medial: 8.92
TR max vel: 285 cm/s

## 2018-01-14 NOTE — Progress Notes (Signed)
*  PRELIMINARY RESULTS* Echocardiogram 2D Echocardiogram has been performed.  Kelly Dougherty 01/14/2018, 3:29 PM

## 2018-01-17 ENCOUNTER — Encounter: Payer: Self-pay | Admitting: Physician Assistant

## 2018-01-17 ENCOUNTER — Telehealth: Payer: Self-pay

## 2018-01-17 NOTE — Telephone Encounter (Signed)
Called pt., no answer. Left message for pt to return call.  

## 2018-01-17 NOTE — Telephone Encounter (Signed)
-----   Message from Liliane Shi, [place] sent at 01/17/2018  4:11 PM EST ----- Results reviewed for Ermalinda Barrios, PA-C  Please call patient.  The echocardiogram shows normal heart function (ejection fraction), mild to mod leakage of the mitral and aortic valves, mod leakage of the tricuspid valve, mild to mod increased pulmonary artery pressure.  Overall, no significant change since last study in 2017.  Continue current medications and follow up as planned.  Richardson Dopp, PA-C   01/17/2018 3:44 PM

## 2018-01-20 NOTE — Progress Notes (Signed)
Cardiology Office Note    Date:  01/21/2018   ID:  [name], DOB [birth year], MRN [id number]  PCP:  Kelly Rossetti, MD  Cardiologist: Dr. Bronson Ing  Chief Complaint  Patient presents with  . Follow-up    s/p echo and recent monitor    History of Present Illness:    Kelly Dougherty is a 77 y.o. 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    She was recently evaluated by Kelly Barrios, PA-C on 01/12/2018 and reported brief episodes of tachycardia ever since 10/2017 which would occasionally awaken her at night. Her symptoms were suspicious for recurrent episodes of atrial fibrillation, therefore a 48-Hour Holter Monitor and echocardiogram were ordered. She was started on PRN Lopressor 12.45m at that time.  Her echocardiogram showed a preserved EF of 60-65%, no regional WMA, mild to moderate AI, mild to moderate MR, and moderate TR. Her Holter monitor showed primarily SR with rare PAC's and one episode of atrial tachycardia for 7 beats. Otherwise, there were no significant arrhythmias.   In talking with the patient today, she reports doing well since her last office visit and notes her palpitations have resolved. They had started to improve prior to her office visit on 1/16 but have completely gone away since. She denies any associated chest pain, dyspnea on exertion, orthopnea, PND, edema, lightheadedness, or presyncope. She has reduced her caffeine intake significantly during this timeframe.   She remains on Eliquis for anticoagulation and denies any evidence of active bleeding.    Past Medical History:  Diagnosis Date  . Arthritis   . Atrial fibrillation ([place]   . Cataract   . Dysrhythmia    04/2015 with atrial fibrillation  . Glaucoma   . History of echocardiogram    Echo 1/19: EF 60-65, no RWMA,  normal diastolic function, mild to mod AI, MAC, mild to mod MR, mild BAE, mod TR, PASP 40    Past Surgical History:  Procedure Laterality Date  . APPENDECTOMY     77years of age  . CARDIOVERSION N/A 10/03/2015   Procedure: CARDIOVERSION;  Surgeon: SHerminio Commons MD;  Location: [facility];  Service: Cardiovascular;  Laterality: N/A;  . COLONOSCOPY N/A 07/30/2016   Procedure: COLONOSCOPY;  Surgeon: NRogene Houston MD;  Location: [facility];  Service: Endoscopy;  Laterality: N/A;  8:30  . EYE SURGERY     left  . HEMORROIDECTOMY    . TONSILLECTOMY     childhood    Current Medications: Outpatient Medications Prior to Visit  Medication Sig Dispense Refill  . calcium-vitamin D (OSCAL WITH D) 500-200 MG-UNIT tablet Take 1 tablet by mouth.    . carbamide peroxide (DEBROX) 6.5 % otic solution Place 5 drops into the right ear 2 (two) times daily. FOR  3 days 15 mL 0  . Docusate Calcium (STOOL SOFTENER PO) Take by mouth.    .Arne Cleveland5 MG TABS tablet TAKE ONE TABLET BY MOUTH TWICE DAILY 180 tablet 3  . furosemide (LASIX) 20 MG tablet Take 20 mg daily prn as needed for swelling 90 tablet 3  . latanoprost (XALATAN) 0.005 % ophthalmic solution Place 1 drop into both eyes at bedtime.     . MULTAQ 400 MG tablet TAKE ONE TABLET BY MOUTH TWICE DAILY WITH A MEAL 180 tablet 3  . potassium  chloride (K-DUR) 10 MEQ tablet Take 10 meq only on days you taking Lasix for swelling 90 tablet 3  . timolol (TIMOPTIC) 0.5 % ophthalmic solution Place 1 drop into the left eye 2 (two) times daily.     No facility-administered medications prior to visit.      Allergies:   Patient has no known allergies.   Social History   Socioeconomic History  . Marital status: Married    Spouse name: None  . Number of children: None  . Years of education: None  . Highest education level: None  Social Needs  . Financial resource strain: None  . Food insecurity - worry: None  . Food insecurity - inability: None  .  Transportation needs - medical: None  . Transportation needs - non-medical: None  Occupational History  . None  Tobacco Use  . Smoking status: Never Smoker  . Smokeless tobacco: Never Used  Substance and Sexual Activity  . Alcohol use: Yes    Alcohol/week: 4.2 oz    Types: 7 Glasses of wine per week    Comment: one glass of wine with dinner each night  . Drug use: No  . Sexual activity: Not Currently  Other Topics Concern  . None  Social History Narrative  . None     Family History:  The patient's family history includes Alzheimer's disease in her father; Hearing loss in her mother.   Review of Systems:   Please see the history of present illness.     General:  No chills, fever, night sweats or weight changes.  Cardiovascular:  No chest pain, dyspnea on exertion, edema, orthopnea,  paroxysmal nocturnal dyspnea. Positive for palpitations (now resolved).  Dermatological: No rash, lesions/masses Respiratory: No cough, dyspnea Urologic: No hematuria, dysuria Abdominal:   No nausea, vomiting, diarrhea, bright red blood per rectum, melena, or hematemesis Neurologic:  No visual changes, wkns, changes in mental status. All other systems reviewed and are otherwise negative except as noted above.   Physical Exam:    VS:  BP 134/74   Pulse (!) 59   Ht _0  (1.651 m)   Wt 113 lb (51.3 kg)   SpO2 96%   BMI 18.80 kg/m    General: Well developed, well nourished Caucasian female appearing in no acute distress. Head: Normocephalic, atraumatic, sclera non-icteric, no xanthomas, nares are without discharge.  Neck: No carotid bruits. JVD not elevated.  Lungs: Respirations regular and unlabored, without wheezes or rales.  Heart: Regular rate and rhythm. No S3 or S4.  No rubs or gallops appreciated. 2/6 SEM along Apex Abdomen: Soft, non-tender, non-distended with normoactive bowel sounds. No hepatomegaly. No rebound/guarding. No obvious abdominal masses. Msk:  Strength and tone appear  normal for age. No joint deformities or effusions. Extremities: No clubbing or cyanosis. No edema.  Distal pedal pulses are 2+ bilaterally. Neuro: Alert and oriented X 3. Moves all extremities spontaneously. No focal deficits noted. Psych:  Responds to questions appropriately with a normal affect. Skin: No rashes or lesions noted  Wt Readings from Last 3 Encounters:  01/21/18 113 lb (51.3 kg)  01/12/18 114 lb (51.7 kg)  01/05/18 111 lb (50.3 kg)     Studies/Labs Reviewed:   EKG:  EKG is not ordered today.   Recent Labs: 01/05/2018: ALT 19; BUN 11; Creat 0.82; Hemoglobin 13.7; Platelets 227; Potassium 4.1; Sodium 137 01/12/2018: TSH 2.404   Lipid Panel    Component Value Date/Time   CHOL 198 01/05/2018 0843   TRIG 88  01/05/2018 0843   HDL 74 01/05/2018 0843   CHOLHDL 2.7 01/05/2018 0843   VLDL 15 01/04/2017 0854   LDLCALC 107 (H) 01/04/2017 0854    Additional studies/ records that were reviewed today include:   Echocardiogram: 01/14/2018 Study Conclusions  - Left ventricle: The cavity size was normal. Wall thickness was   normal. Systolic function was normal. The estimated ejection   fraction was in the range of 60% to 65%. Wall motion was normal;   there were no regional wall motion abnormalities. Left   ventricular diastolic function parameters were normal. - Aortic valve: There was mild to moderate regurgitation. - Mitral valve: Mildly to moderately calcified annulus. Normal   thickness leaflets . There was mild to moderate regurgitation. - Left atrium: The atrium was mildly dilated. - Right atrium: The atrium was mildly dilated. - Tricuspid valve: There was moderate regurgitation. - Pulmonary arteries: PA peak pressure: 40 mm Hg (S). - Systemic veins: Dilated IVC with normal respiratory variation.   Estimated CVP 8 mmHg.  Holter Monitor: 01/12/2018  Primary rhytyhm during study is sinus rhythm  Min HR 51, Max HR 101, Avg HR 68  Rare supraventricular ectopy,  primary in the form of PACs. One episode of atach 7 beats.  Rare ventricular ectopy all in the form of isolated PVCs  Short run of atach, otherwise no arrhythmias  Reported symptoms correlated with normal sinus rhythm    Assessment:    1. Paroxysmal atrial fibrillation (HCC)   2. Current use of long term anticoagulation   3. Valvular heart disease      Plan:   In order of problems listed above:  1. Paroxysmal Atrial Fibrillation/Use of Long-Term Anticoagulation - the patient has known PAF and has remained on Multaq. She was previously evaluated for recurrent palpitations with labs showing no acute abnormalities. Holter monitor showed rare PAC's with one episode of atrial tachycardia lasting for 7 beats.  - she reports her palpitations have spontaneously resolved. She did reduce her caffeine intake in the interim and continued reduction of caffeine and alcohol intake was encouraged to reduce the risk of recurrent episodes. Will provide Rx for PRN Lopressor 12.40m if she develops recurrent episodes. Will continue on Multaq.  - she denies any evidence of active bleeding. Continue with Eliquis for anticoagulation.   2. Valvular Heart Disease - recent echo showed a preserved EF of 60-65%, no regional WMA, mild to moderate AI, mild to moderate MR, and moderate TR. Will continue to follow with routine echocardiograms.  - she denies any noted symptoms at this time.     Medication Adjustments/Labs and Tests Ordered: Current medicines are reviewed at length with the patient today.  Concerns regarding medicines are outlined above.  Medication changes, Labs and Tests ordered today are listed in the Patient Instructions below. Patient Instructions  Medication Instructions:   Your physician has recommended you make the following change in your medication:   Add metoprolol tartrate 12.5 mg by mouth daily as needed for palpitations.  Continue all other medications the  same.  Labwork:  NONE  Testing/Procedures:  NONE  Follow-Up:  Your physician recommends that you schedule a follow-up appointment in: 6 months with Dr. KBronson Ing You will receive a reminder letter in the mail in about 4 months reminding you to call and schedule your appointment. If you don't receive this letter, please contact our office.  Any Other Special Instructions Will Be Listed Below (If Applicable).  If you need a refill on your cardiac  medications before your next appointment, please call your pharmacy.    Signed, Erma Heritage, PA-C  01/21/2018 8:25 PM    [facility] [address] [place], [place] [address] Phone: [phone]

## 2018-01-21 ENCOUNTER — Ambulatory Visit: Payer: Medicare Other | Admitting: Student

## 2018-01-21 ENCOUNTER — Encounter: Payer: Self-pay | Admitting: Student

## 2018-01-21 ENCOUNTER — Other Ambulatory Visit: Payer: Self-pay

## 2018-01-21 VITALS — BP 134/74 | HR 59 | Ht 65.0 in | Wt 113.0 lb

## 2018-01-21 DIAGNOSIS — I480 Paroxysmal atrial fibrillation: Secondary | ICD-10-CM

## 2018-01-21 DIAGNOSIS — I38 Endocarditis, valve unspecified: Secondary | ICD-10-CM

## 2018-01-21 DIAGNOSIS — Z7901 Long term (current) use of anticoagulants: Secondary | ICD-10-CM | POA: Diagnosis not present

## 2018-01-21 MED ORDER — METOPROLOL TARTRATE 25 MG PO TABS: 12.5000 mg | ORAL_TABLET | Freq: Every day | ORAL | 3 refills | Status: DC | PRN

## 2018-01-21 NOTE — Patient Instructions (Signed)
Medication Instructions:   Your physician has recommended you make the following change in your medication:   Add metoprolol tartrate 12.5 mg by mouth daily as needed for palpitations.  Continue all other medications the same.  Labwork:  NONE  Testing/Procedures:  NONE  Follow-Up:  Your physician recommends that you schedule a follow-up appointment in: 6 months with Dr. Bronson Ing. You will receive a reminder letter in the mail in about 4 months reminding you to call and schedule your appointment. If you don't receive this letter, please contact our office.  Any Other Special Instructions Will Be Listed Below (If Applicable).  If you need a refill on your cardiac medications before your next appointment, please call your pharmacy.

## 2018-04-20 ENCOUNTER — Telehealth: Payer: Self-pay | Admitting: Cardiovascular Disease

## 2018-04-20 NOTE — Telephone Encounter (Signed)
   Can use antihistamines but would prefer to use Claritin or Zyrtec (generic brand is fine). Would avoid Benadryl given history of PAF and possible side-effects from the medication.   Thanks,  Erma Heritage, PA-C 04/20/2018, 9:44 AM

## 2018-04-20 NOTE — Telephone Encounter (Signed)
Will forward to [place], PA-C to advise.

## 2018-04-20 NOTE — Telephone Encounter (Signed)
Patient is asking if she can take antihistamine with her other medications / tg

## 2018-04-20 NOTE — Telephone Encounter (Signed)
Pt notified. She voiced understanding.  

## 2018-06-04 ENCOUNTER — Other Ambulatory Visit: Payer: Self-pay | Admitting: Cardiovascular Disease

## 2018-06-14 ENCOUNTER — Encounter: Payer: Self-pay | Admitting: Cardiovascular Disease

## 2018-07-25 ENCOUNTER — Ambulatory Visit: Payer: Medicare Other | Admitting: Cardiovascular Disease

## 2018-08-15 ENCOUNTER — Ambulatory Visit: Payer: Medicare Other | Admitting: Cardiovascular Disease

## 2018-08-15 ENCOUNTER — Encounter: Payer: Self-pay | Admitting: Cardiovascular Disease

## 2018-08-15 VITALS — BP 102/62 | HR 66 | Ht 65.0 in | Wt 112.0 lb

## 2018-08-15 DIAGNOSIS — I480 Paroxysmal atrial fibrillation: Secondary | ICD-10-CM | POA: Diagnosis not present

## 2018-08-15 DIAGNOSIS — I38 Endocarditis, valve unspecified: Secondary | ICD-10-CM | POA: Diagnosis not present

## 2018-08-15 DIAGNOSIS — Z7901 Long term (current) use of anticoagulants: Secondary | ICD-10-CM | POA: Diagnosis not present

## 2018-08-15 NOTE — Progress Notes (Signed)
SUBJECTIVE: The patient presents for routine follow-up.  She has paroxysmal atrial fibrillation, chronic diastolic heart failure, history of tachycardia induced cardiomyopathy, and valvular heart disease.  Echocardiogram 01/14/2018 demonstrated normal left ventricular systolic function, normal diastolic function, and normal regional wall motion, LVEF 60 to 65%, mild to moderate aortic and mitral regurgitation, and moderate tricuspid regurgitation.  Pulmonary pressures 40 mmHg.  She wore a Holter monitor in January 2019 which demonstrated primarily sinus rhythm with an average heart rate of 68 bpm with rare supraventricular ectopy primarily in the form of PACs and one episode of atrial tachycardia which lasted 7 beats.  There was rare ventricular ectopy on the form of isolated PVCs.  There were otherwise no arrhythmias.  She has been maintained on dronedarone and as needed metoprolol.  She has taken 12.5 mg of metoprolol on 4 separate occasions since January.  She otherwise denies chest pain, leg swelling, orthopnea, and shortness of breath.  She and her husband are excited because they are about to have their first great grandchild who is due in late March/early April.  Her husband told me about his college experience at [employer] running cross-country during his freshman year.      Review of Systems: As per "subjective", otherwise negative.  No Known Allergies  Current Outpatient Medications  Medication Sig Dispense Refill  . calcium-vitamin D (OSCAL WITH D) 500-200 MG-UNIT tablet Take 1 tablet by mouth.    . carbamide peroxide (DEBROX) 6.5 % otic solution Place 5 drops into the right ear 2 (two) times daily. FOR  3 days 15 mL 0  . Docusate Calcium (STOOL SOFTENER PO) Take by mouth.    Arne Cleveland 5 MG TABS tablet TAKE 1 TABLET BY MOUTH TWICE DAILY 180 tablet 3  . furosemide (LASIX) 20 MG tablet Take 20 mg daily prn as needed for swelling 90 tablet 3  . latanoprost (XALATAN) 0.005 %  ophthalmic solution Place 1 drop into both eyes at bedtime.     . metoprolol tartrate (LOPRESSOR) 25 MG tablet Take 0.5 tablets (12.5 mg total) by mouth daily as needed. palpitations 30 tablet 3  . MULTAQ 400 MG tablet TAKE ONE TABLET BY MOUTH TWICE DAILY WITH A MEAL 180 tablet 3  . potassium chloride (K-DUR) 10 MEQ tablet Take 10 meq only on days you taking Lasix for swelling 90 tablet 3  . timolol (TIMOPTIC) 0.5 % ophthalmic solution Place 1 drop into the left eye 2 (two) times daily.     No current facility-administered medications for this visit.     Past Medical History:  Diagnosis Date  . Arthritis   . Atrial fibrillation ([place])   . Cataract   . Dysrhythmia    04/2015 with atrial fibrillation  . Glaucoma   . History of echocardiogram    Echo 1/19: EF 60-65, no RWMA, normal diastolic function, mild to mod AI, MAC, mild to mod MR, mild BAE, mod TR, PASP 40    Past Surgical History:  Procedure Laterality Date  . APPENDECTOMY     77 years of age  . CARDIOVERSION N/A 10/03/2015   Procedure: CARDIOVERSION;  Surgeon: Herminio Commons, MD;  Location: [facility];  Service: Cardiovascular;  Laterality: N/A;  . COLONOSCOPY N/A 07/30/2016   Procedure: COLONOSCOPY;  Surgeon: Rogene Houston, MD;  Location: [facility];  Service: Endoscopy;  Laterality: N/A;  8:30  . EYE SURGERY     left  . HEMORROIDECTOMY    . TONSILLECTOMY  childhood    Social History   Socioeconomic History  . Marital status: Married    Spouse name: Not on file  . Number of children: Not on file  . Years of education: Not on file  . Highest education level: Not on file  Occupational History  . Not on file  Social Needs  . Financial resource strain: Not on file  . Food insecurity:    Worry: Not on file    Inability: Not on file  . Transportation needs:    Medical: Not on file    Non-medical: Not on file  Tobacco Use  . Smoking status: Never Smoker  . Smokeless tobacco: Never Used  Substance and  Sexual Activity  . Alcohol use: Yes    Alcohol/week: 7.0 standard drinks    Types: 7 Glasses of wine per week    Comment: one glass of wine with dinner each night  . Drug use: No  . Sexual activity: Not Currently  Lifestyle  . Physical activity:    Days per week: Not on file    Minutes per session: Not on file  . Stress: Not on file  Relationships  . Social connections:    Talks on phone: Not on file    Gets together: Not on file    Attends religious service: Not on file    Active member of club or organization: Not on file    Attends meetings of clubs or organizations: Not on file    Relationship status: Not on file  . Intimate partner violence:    Fear of current or ex partner: Not on file    Emotionally abused: Not on file    Physically abused: Not on file    Forced sexual activity: Not on file  Other Topics Concern  . Not on file  Social History Narrative  . Not on file     Vitals:   08/15/18 0843  BP: 102/62  Pulse: 66  SpO2: 97%  Weight: 112 lb (50.8 kg)  Height: 5' 5"  (1.651 m)    Wt Readings from Last 3 Encounters:  08/15/18 112 lb (50.8 kg)  01/21/18 113 lb (51.3 kg)  01/12/18 114 lb (51.7 kg)     PHYSICAL EXAM General: NAD HEENT: Normal. Neck: No JVD, no thyromegaly. Lungs: Clear to auscultation bilaterally with normal respiratory effort. CV: Regular rate and rhythm with premature contractions appreciated, normal S1/S2, no S3/S4, no murmur. No pretibial or periankle edema.  No carotid bruit.   Abdomen: Soft, nontender, no distention.  Neurologic: Alert and oriented.  Psych: Normal affect. Skin: Normal. Musculoskeletal: No gross deformities.    ECG: Reviewed above under Subjective   Labs: Lab Results  Component Value Date/Time   K 4.1 01/05/2018 08:43 AM   BUN 11 01/05/2018 08:43 AM   CREATININE 0.82 01/05/2018 08:43 AM   ALT 19 01/05/2018 08:43 AM   TSH 2.404 01/12/2018 01:44 PM   TSH 1.916 10/29/2015 10:20 AM   HGB 13.7 01/05/2018  08:43 AM     Lipids: Lab Results  Component Value Date/Time   LDLCALC 106 (H) 01/05/2018 08:43 AM   CHOL 198 01/05/2018 08:43 AM   TRIG 88 01/05/2018 08:43 AM   HDL 74 01/05/2018 08:43 AM       ASSESSMENT AND PLAN: 1. Paroxysmal atrial fibrillation: Symptomatically stable on Multaq 400 mg bid and prn metoprolol 12.5 mg. Anticoagulated with Eliquis. No changes to therapy.  2. Valvular heart disease: Echocardiogram reviewed above. Symptomatically stable. I will  monitor clinically and with surveillance echocardiography.   Disposition: Follow up 1 year.   Kate Sable, M.D., F.A.C.C.

## 2018-08-15 NOTE — Patient Instructions (Addendum)
Your physician wants you to follow-up in: 1 year with Dr.Koneswaran You will receive a reminder letter in the mail two months in advance. If you don't receive a letter, please call our office to schedule the follow-up appointment.     Your physician recommends that you continue on your current medications as directed. Please refer to the Current Medication list given to you today.    If you need a refill on your cardiac medications before your next appointment, please call your pharmacy.     No labs or tests ordered today.       Thank you for choosing [facility] !        

## 2018-09-02 ENCOUNTER — Other Ambulatory Visit: Payer: Self-pay | Admitting: Cardiovascular Disease

## 2018-09-21 ENCOUNTER — Telehealth: Payer: Self-pay | Admitting: *Deleted

## 2018-09-21 NOTE — Telephone Encounter (Signed)
Received call from patient.   Reports that she has been having intense itchy rash x5 weeks. Reports that she has areas of tiny bumps and itching worsens at night. Reports that she was working out in yard and thought she had insect bite or had been exposed to poison ivy or something similar, but no OTC cream is helping.   States that husband is not having any similar Sx. Denies change in detergents, foods, or medications. Appointment scheduled for 09/22/2018.

## 2018-09-21 NOTE — Telephone Encounter (Signed)
Agree 

## 2018-09-22 ENCOUNTER — Ambulatory Visit: Payer: Medicare Other | Admitting: Physician Assistant

## 2018-09-22 ENCOUNTER — Encounter: Payer: Self-pay | Admitting: Physician Assistant

## 2018-09-22 VITALS — BP 112/64 | HR 69 | Temp 98.1°F | Resp 14 | Ht 65.0 in | Wt 110.8 lb

## 2018-09-22 DIAGNOSIS — L239 Allergic contact dermatitis, unspecified cause: Secondary | ICD-10-CM | POA: Diagnosis not present

## 2018-09-22 MED ORDER — METHYLPREDNISOLONE ACETATE 80 MG/ML IJ SUSP
80.0000 mg | Freq: Once | INTRAMUSCULAR | Status: AC
  Administered 2018-09-22: 80 mg via INTRAMUSCULAR

## 2018-09-22 NOTE — Progress Notes (Signed)
Patient ID: Kelly Dougherty MRN: [id number], DOB: [birth year], 77 y.o. Date of Encounter: 09/22/2018, 10:20 AM    Chief Complaint:  Chief Complaint  Patient presents with  . Rash    itching all over body symptoms for 2 weeks      HPI: 77 y.o. year old female presents with above.   Reports that she first noticed a few bumps on her back that were very itchy.   Says that she does a lot of yard work and is outside a lot and at first thought may be she had a spider bite or some type of insect bite or something. However since then,  has been developing a few scattered bumps on her back, her chest, and a few on her arms and her ankles.  States that they are very itchy.  Says it has been going on for about 2 weeks now and is not resolving. She lives with her husband and he is having no itching and no rash. She states that she uses Dove soap and non-scented detergent and can think of no new products that she has come in contact with.     Home Meds:   Outpatient Medications Prior to Visit  Medication Sig Dispense Refill  . calcium-vitamin D (OSCAL WITH D) 500-200 MG-UNIT tablet Take 1 tablet by mouth.    . carbamide peroxide (DEBROX) 6.5 % otic solution Place 5 drops into the right ear 2 (two) times daily. FOR  3 days 15 mL 0  . Docusate Calcium (STOOL SOFTENER PO) Take by mouth.    Arne Cleveland 5 MG TABS tablet TAKE 1 TABLET BY MOUTH TWICE DAILY 180 tablet 3  . furosemide (LASIX) 20 MG tablet Take 20 mg daily prn as needed for swelling 90 tablet 3  . latanoprost (XALATAN) 0.005 % ophthalmic solution Place 1 drop into both eyes at bedtime.     . MULTAQ 400 MG tablet TAKE 1 TABLET BY MOUTH TWICE DAILY WITH A MEAL 180 tablet 3  . potassium chloride (K-DUR) 10 MEQ tablet Take 10 meq only on days you taking Lasix for swelling 90 tablet 3  . timolol (TIMOPTIC) 0.5 % ophthalmic solution Place 1 drop into the left eye 2 (two) times daily.    . metoprolol tartrate (LOPRESSOR) 25 MG tablet Take 0.5  tablets (12.5 mg total) by mouth daily as needed. palpitations 30 tablet 3   No facility-administered medications prior to visit.     Allergies: No Known Allergies    Review of Systems: See HPI for pertinent ROS. All other ROS negative.    Physical Exam: Blood pressure 112/64, pulse 69, temperature 98.1 F (36.7 C), temperature source Oral, resp. rate 14, height 5\' 5"  (1.651 m), weight 50.3 kg, SpO2 98 %., Body mass index is 18.44 kg/m. General:  WNWD WF. Appears in no acute distress. Neck: Supple. No thyromegaly. No lymphadenopathy. Lungs: Clear bilaterally to auscultation without wheezes, rales, or rhonchi. Breathing is unlabored. Heart: Regular rhythm. No murmurs, rubs, or gallops. Msk:  Strength and tone normal for age. Extremities/Skin:  Each lesion is vessiculopapules that is 1 -2 mm.  There are a few of these scattered on back, few scattered on abdomen, few on arms and more on ankles bilaterally.  Neuro: Alert and oriented X 3. Moves all extremities spontaneously. Gait is normal. CNII-XII grossly in tact. Psych:  Responds to questions appropriately with a normal affect.     ASSESSMENT AND PLAN:  77 y.o. year old female with  1. Allergic dermatitis Scabies is in the differential but the appearance of these lesions really looks more like an allergic contact dermatitis such as Rous dermatitis. So if this were scabies I would expect for her to have more towards the axilla, growing region and she has none in those areas. Also her husband has no itching or bumps--- all of this makes scabies less likely. As well she does work outside a lot so exposure to a [employer] would be likely. Treat with Depo-Medrol 80 mg IM. Discussed that my expectations would be for the itching and the rash to gradually resolve over the next few days.  If these do not gradually resolve or if they seem to get better but then recur then she is to follow-up.  She voices understanding and  agrees.   Signed, [address] [place], [place], [facility] 09/22/2018 10:20 AM

## 2018-09-22 NOTE — Addendum Note (Signed)
Addended by: Vonna Kotyk A on: 09/22/2018 10:37 AM   Modules accepted: Orders

## 2019-01-09 ENCOUNTER — Ambulatory Visit (INDEPENDENT_AMBULATORY_CARE_PROVIDER_SITE_OTHER): Payer: Medicare Other | Admitting: Family Medicine

## 2019-01-09 ENCOUNTER — Encounter: Payer: Self-pay | Admitting: Family Medicine

## 2019-01-09 ENCOUNTER — Other Ambulatory Visit: Payer: Self-pay

## 2019-01-09 VITALS — BP 122/64 | HR 58 | Temp 98.1°F | Resp 14 | Ht 65.0 in | Wt 112.6 lb

## 2019-01-09 DIAGNOSIS — E785 Hyperlipidemia, unspecified: Secondary | ICD-10-CM

## 2019-01-09 DIAGNOSIS — I482 Chronic atrial fibrillation, unspecified: Secondary | ICD-10-CM

## 2019-01-09 DIAGNOSIS — Z0000 Encounter for general adult medical examination without abnormal findings: Secondary | ICD-10-CM | POA: Diagnosis not present

## 2019-01-09 DIAGNOSIS — M810 Age-related osteoporosis without current pathological fracture: Secondary | ICD-10-CM

## 2019-01-09 DIAGNOSIS — Z789 Other specified health status: Secondary | ICD-10-CM

## 2019-01-09 DIAGNOSIS — H409 Unspecified glaucoma: Secondary | ICD-10-CM

## 2019-01-09 NOTE — Patient Instructions (Signed)
F/U 1 year for physical  

## 2019-01-09 NOTE — Progress Notes (Signed)
Subjective:   Patient presents for Medicare Annual/Subsequent preventive examination.    Pt here for wellness exam, no new concerns  doing well on her medications     Review Past Medical/Family/Social: Per EMR   Risk Factors  Current exercise habits: walks Dietary issues discussed: Yes  Cardiac risk factors: Per EMR  Depression Screen  (Note: if answer to either of the following is "Yes", a more complete depression screening is indicated)  Over the past two weeks, have you felt down, depressed or hopeless? No Over the past two weeks, have you felt little interest or pleasure in doing things? No Have you lost interest or pleasure in daily life? No Do you often feel hopeless? No Do you cry easily over simple problems? No   Activities of Daily Living  In your present state of health, do you have any difficulty performing the following activities?:  Driving? No  Managing money? No  Feeding yourself? No  Getting from bed to chair? No  Climbing a flight of stairs? No  Preparing food and eating?: No  Bathing or showering? No  Getting dressed: No  Getting to the toilet? No  Using the toilet:No  Moving around from place to place: No  In the past year have you fallen or had a near fall?:No  Are you sexually active? No  Do you have more than one partner? No   Hearing Difficulties: No  Do you often ask people to speak up or repeat themselves? No  Do you experience ringing or noises in your ears? No Do you have difficulty understanding soft or whispered voices? No  Do you feel that you have a problem with memory? No Do you often misplace items? No  Do you feel safe at home? Yes  Cognitive Testing  Alert? Yes Normal Appearance?Yes  Oriented to person? Yes Place? Yes  Time? Yes  Recall of three objects? Yes  Can perform simple calculations? Yes  Displays appropriate judgment?Yes  Can read the correct time from a watch face?Yes   List the Names of Other  Physician/Practitioners you currently use:  Ophthalmology- Dr. Jene Every- for Glaucoma, every 6 months maintained on drops  Cardiology- for chronic A fib- seen once a year. Dr. Jacinta Shoe   Screening Tests / Date Colonoscopy     UTD                 Zostavax /Shingrix- Declines Pneumonia- UTD  Mammogram UTD- finished getting mammograms  Influenza Vaccine UTD  Tetanus/tdap UTD Bone Density- Oct 2016 osteoporosis - declines further   ROS: GEN- denies fatigue, fever, weight loss,weakness, recent illness HEENT- denies eye drainage, change in vision, nasal discharge, CVS- denies chest pain, palpitations RESP- denies SOB, cough, wheeze ABD- denies N/V, change in stools, abd pain GU- denies dysuria, hematuria, dribbling, incontinence MSK- denies joint pain, muscle aches, injury Neuro- denies headache, dizziness, syncope, seizure activity  PHYSICAL: GEN- NAD, alert and oriented x3, vitals reviewed HEENT- PERRL, EOMI, non injected sclera, pink conjunctiva, MMM, oropharynx clear Neck- Supple, no thryomegaly, no bruit CVS- RRR, no murmur RESP-CTAB ABD-NABS,soft,NT,ND EXT- No edema Pulses- Radial, DP- 2+   Assessment:    Annual wellness medicare exam   Plan:    During the course of the visit the patient was educated and counseled about appropriate screening and preventive services including:     Fall/Depression/Audit C screening Negative   Fasting labs obtained - Had pinneapple/ bran muffin this AM    Wears a poise pad for mild bladder  leakage, not incontinent    Osteoporosis- taking calcium and vitamin D, due to her cardiac medications and interactions does not want medications, prefers not to  Screen any further- she was on actonel in the past   A fib- rate controlled, chronic afib, reviewed cardiology note, no changes to meds     Glaucoma- followed every 6 months   Given copy of advanced directives   Full Code   Medicare Attestation  I have personally reviewed:   The patient's medical and social history  Their use of alcohol, tobacco or illicit drugs  Their current medications and supplements  The patient's functional ability including ADLs,fall risks, home safety risks, cognitive, and hearing and visual impairment  Diet and physical activities  Evidence for depression or mood disorders  The patient's weight, height, BMI, and visual acuity have been recorded in the chart. I have made referrals, counseling, and provided education to the patient based on review of the above and I have provided the patient with a written personalized care plan for preventive services.

## 2019-01-10 LAB — COMPREHENSIVE METABOLIC PANEL
AG RATIO: 1.4 (calc) (ref 1.0–2.5)
ALBUMIN MSPROF: 4 g/dL (ref 3.6–5.1)
ALKALINE PHOSPHATASE (APISO): 88 U/L (ref 33–130)
ALT: 35 U/L — ABNORMAL HIGH (ref 6–29)
AST: 36 U/L — ABNORMAL HIGH (ref 10–35)
BILIRUBIN TOTAL: 0.6 mg/dL (ref 0.2–1.2)
BUN: 12 mg/dL (ref 7–25)
CALCIUM: 9.2 mg/dL (ref 8.6–10.4)
CHLORIDE: 102 mmol/L (ref 98–110)
CO2: 27 mmol/L (ref 20–32)
Creat: 0.85 mg/dL (ref 0.60–0.93)
Globulin: 2.8 g/dL (calc) (ref 1.9–3.7)
Glucose, Bld: 62 mg/dL — ABNORMAL LOW (ref 65–99)
POTASSIUM: 4.3 mmol/L (ref 3.5–5.3)
SODIUM: 139 mmol/L (ref 135–146)
TOTAL PROTEIN: 6.8 g/dL (ref 6.1–8.1)

## 2019-01-10 LAB — LIPID PANEL
CHOLESTEROL: 188 mg/dL (ref <200)
HDL: 68 mg/dL (ref >50)
LDL Cholesterol (Calc): 105 mg/dL (calc) — ABNORMAL HIGH
Non-HDL Cholesterol (Calc): 120 mg/dL (calc) (ref <130)
Total CHOL/HDL Ratio: 2.8 (calc) (ref <5.0)
Triglycerides: 65 mg/dL (ref <150)

## 2019-01-10 LAB — CBC WITH DIFFERENTIAL/PLATELET
ABSOLUTE MONOCYTES: 522 {cells}/uL (ref 200–950)
BASOS PCT: 0.9 %
Basophils Absolute: 41 cells/uL (ref 0–200)
EOS PCT: 0.9 %
Eosinophils Absolute: 41 cells/uL (ref 15–500)
HEMATOCRIT: 41.3 % (ref 35.0–45.0)
Hemoglobin: 14.2 g/dL (ref 11.7–15.5)
LYMPHS ABS: 1521 {cells}/uL (ref 850–3900)
MCH: 31.7 pg (ref 27.0–33.0)
MCHC: 34.4 g/dL (ref 32.0–36.0)
MCV: 92.2 fL (ref 80.0–100.0)
MPV: 9.7 fL (ref 7.5–12.5)
Monocytes Relative: 11.6 %
NEUTROS PCT: 52.8 %
Neutro Abs: 2376 cells/uL (ref 1500–7800)
PLATELETS: 232 10*3/uL (ref 140–400)
RBC: 4.48 10*6/uL (ref 3.80–5.10)
RDW: 11.8 % (ref 11.0–15.0)
Total Lymphocyte: 33.8 %
WBC: 4.5 10*3/uL (ref 3.8–10.8)

## 2019-01-12 ENCOUNTER — Encounter: Payer: Self-pay | Admitting: *Deleted

## 2019-01-12 DIAGNOSIS — R945 Abnormal results of liver function studies: Principal | ICD-10-CM

## 2019-01-12 DIAGNOSIS — R7989 Other specified abnormal findings of blood chemistry: Secondary | ICD-10-CM

## 2019-03-03 ENCOUNTER — Other Ambulatory Visit: Payer: Self-pay | Admitting: Physician Assistant

## 2019-05-30 ENCOUNTER — Other Ambulatory Visit: Payer: Self-pay | Admitting: Cardiovascular Disease

## 2019-08-24 ENCOUNTER — Telehealth: Payer: Self-pay | Admitting: Family Medicine

## 2019-08-24 NOTE — Telephone Encounter (Signed)
Patient has uti with blood, would like to know if rx can be called in for her,  [phone]

## 2019-08-24 NOTE — Telephone Encounter (Signed)
Patient requires OV

## 2019-08-30 ENCOUNTER — Encounter: Payer: Self-pay | Admitting: Cardiovascular Disease

## 2019-08-30 ENCOUNTER — Ambulatory Visit: Payer: Medicare Other | Admitting: Cardiovascular Disease

## 2019-08-30 VITALS — BP 105/67 | HR 81 | Temp 96.9°F | Ht 65.0 in | Wt 111.0 lb

## 2019-08-30 DIAGNOSIS — S81809A Unspecified open wound, unspecified lower leg, initial encounter: Secondary | ICD-10-CM | POA: Diagnosis not present

## 2019-08-30 DIAGNOSIS — R0602 Shortness of breath: Secondary | ICD-10-CM

## 2019-08-30 DIAGNOSIS — I480 Paroxysmal atrial fibrillation: Secondary | ICD-10-CM

## 2019-08-30 DIAGNOSIS — Z7901 Long term (current) use of anticoagulants: Secondary | ICD-10-CM

## 2019-08-30 DIAGNOSIS — I38 Endocarditis, valve unspecified: Secondary | ICD-10-CM

## 2019-08-30 MED ORDER — APIXABAN 5 MG PO TABS: 5.0000 mg | ORAL_TABLET | Freq: Two times a day (BID) | ORAL | 3 refills | Status: DC

## 2019-08-30 MED ORDER — METOPROLOL TARTRATE 25 MG PO TABS: 12.5000 mg | ORAL_TABLET | Freq: Two times a day (BID) | ORAL | 3 refills | Status: DC

## 2019-08-30 NOTE — Patient Instructions (Signed)
Medication Instructions:  STOP MULTAQ  START LOPRESSOR 12.5 MG - TWO TIMES DAILY   Labwork: NONE  Testing/Procedures: Your physician has requested that you have an echocardiogram. Echocardiography is a painless test that uses sound waves to create images of your heart. It provides your doctor with information about the size and shape of your heart and how well your heart's chambers and valves are working. This procedure takes approximately one hour. There are no restrictions for this procedure.  You have been referred to [facility] You have been referred to [place]: Your physician recommends that you schedule a follow-up appointment in: 3 MONTHS    Any Other Special Instructions Will Be Listed Below (If Applicable).     If you need a refill on your cardiac medications before your next appointment, please call your pharmacy.

## 2019-08-30 NOTE — Progress Notes (Signed)
SUBJECTIVE: The patient presents for follow-up of paroxysmal atrial fibrillation.  Echocardiogram on 05/01/16 demonstrated normalization of left ventricular systolic function, LVEF 10-93%, with grade 2 diastolic dysfunction. There was mild to moderate left atrial enlargement and mild mitral, mild aortic, and moderate tricuspid regurgitation. There was mild right atrial enlargement.  She has shortness of breath which is sporadic and can occur while showering and drying her hair or when she talks too much.  She denies exertional chest pain.  She denies leg swelling, orthopnea, and paroxysmal nocturnal dyspnea.  She has a small wound on the front of her right leg which has not healed completely and occurred after she bumped her leg into a piece of furniture.  She told me she is occasionally confused but denies memory loss.  Her father had Alzheimer's dementia.  ECG performed today which I ordered and personally interpreted demonstrates probable atrial flutter with variable conduction, 92 bpm.  Soc Hx: Married in 1959. Husband is a retired Marine scientist and studied at [employer]. 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 3 living children, 1 son committed suicide.  Review of Systems: As per "subjective", otherwise negative.  No Known Allergies  Current Outpatient Medications  Medication Sig Dispense Refill  . calcium-vitamin D (OSCAL WITH D) 500-200 MG-UNIT tablet Take 1 tablet by mouth.    [name] Calcium (STOOL SOFTENER PO) Take by mouth.    Arne Cleveland 5 MG TABS tablet Take 1 tablet by mouth twice daily 180 tablet 0  . furosemide (LASIX) 20 MG tablet TAKE 1 TABLET BY MOUTH DAILY AS NEEDED FOR SWELLING 90 tablet 3  . latanoprost (XALATAN) 0.005 % ophthalmic solution Place 1 drop into both eyes at bedtime.     . MULTAQ 400 MG tablet TAKE 1 TABLET BY MOUTH TWICE DAILY WITH A MEAL 180 tablet 3  . potassium chloride (K-DUR,KLOR-CON) 10 MEQ tablet TAKE 1 TABLET BY  MOUTH ONLY ON DAYS YOUR TAKING LASIX FOR SWELLING 90 tablet 3  . metoprolol tartrate (LOPRESSOR) 25 MG tablet Take 0.5 tablets (12.5 mg total) by mouth daily as needed. palpitations 30 tablet 3   No current facility-administered medications for this visit.     Past Medical History:  Diagnosis Date  . Arthritis   . Atrial fibrillation ([place])   . Cataract   . Dysrhythmia    04/2015 with atrial fibrillation  . Glaucoma   . History of echocardiogram    Echo 1/19: EF 60-65, no RWMA, normal diastolic function, mild to mod AI, MAC, mild to mod MR, mild BAE, mod TR, PASP 40    Past Surgical History:  Procedure Laterality Date  . APPENDECTOMY     78 years of age  . CARDIOVERSION N/A 10/03/2015   Procedure: CARDIOVERSION;  Surgeon: Herminio Commons, MD;  Location: [facility];  Service: Cardiovascular;  Laterality: N/A;  . COLONOSCOPY N/A 07/30/2016   Procedure: COLONOSCOPY;  Surgeon: Rogene Houston, MD;  Location: [facility];  Service: Endoscopy;  Laterality: N/A;  8:30  . EYE SURGERY     left  . HEMORROIDECTOMY    . TONSILLECTOMY     childhood    Social History   Socioeconomic History  . Marital status: Married    Spouse name: Not on file  . Number of children: Not on file  . Years of education: Not on file  . Highest education level: Not on file  Occupational History  . Not on  file  Social Needs  . Financial resource strain: Not on file  . Food insecurity    Worry: Not on file    Inability: Not on file  . Transportation needs    Medical: Not on file    Non-medical: Not on file  Tobacco Use  . Smoking status: Never Smoker  . Smokeless tobacco: Never Used  Substance and Sexual Activity  . Alcohol use: Yes    Alcohol/week: 7.0 standard drinks    Types: 7 Glasses of wine per week    Comment: one glass of wine with dinner each night  . Drug use: No  . Sexual activity: Not Currently  Lifestyle  . Physical activity    Days per week: Not on file    Minutes per session:  Not on file  . Stress: Not on file  Relationships  . Social Herbalist on phone: Not on file    Gets together: Not on file    Attends religious service: Not on file    Active member of club or organization: Not on file    Attends meetings of clubs or organizations: Not on file    Relationship status: Not on file  . Intimate partner violence    Fear of current or ex partner: Not on file    Emotionally abused: Not on file    Physically abused: Not on file    Forced sexual activity: Not on file  Other Topics Concern  . Not on file  Social History Narrative  . Not on file     Vitals:   08/30/19 0813  BP: 105/67  Pulse: 81  Temp: (!) 96.9 F (36.1 C)  Weight: 111 lb (50.3 kg)  Height: _0  (1.651 m)    Wt Readings from Last 3 Encounters:  08/30/19 111 lb (50.3 kg)  01/09/19 112 lb 9.6 oz (51.1 kg)  09/22/18 110 lb 12.8 oz (50.3 kg)     PHYSICAL EXAM General: NAD HEENT: Normal. Neck: No JVD, no thyromegaly. Lungs: Clear to auscultation bilaterally with normal respiratory effort. CV: Regular rate and irregular rhythm, normal S1/S2, no S3, no murmur. No pretibial or periankle edema.  No carotid bruit.   Abdomen: Soft, nontender, no distention.  Neurologic: Alert and oriented.  Psych: Normal affect. Skin: Wound on right pretibial area, circular, scabbed over.  No oozing. Musculoskeletal: No gross deformities.    ECG: Reviewed above under Subjective   Labs: Lab Results  Component Value Date/Time   K 4.3 01/09/2019 09:04 AM   BUN 12 01/09/2019 09:04 AM   CREATININE 0.85 01/09/2019 09:04 AM   ALT 35 (H) 01/09/2019 09:04 AM   TSH 2.404 01/12/2018 01:44 PM   TSH 1.916 10/29/2015 10:20 AM   HGB 14.2 01/09/2019 09:04 AM     Lipids: Lab Results  Component Value Date/Time   LDLCALC 105 (H) 01/09/2019 09:04 AM   CHOL 188 01/09/2019 09:04 AM   TRIG 65 01/09/2019 09:04 AM   HDL 68 01/09/2019 09:04 AM       ASSESSMENT AND PLAN:  1.  Paroxysmal  atrial flutter and fibrillation: She appears to be in atrial flutter with variable conduction.  I will discontinue Multaq.  She currently takes Lopressor 12.5 mg as needed.  I will have her take 12.5 mg twice daily.  Continue Eliquis for anticoagulation. I will order a 2-D echocardiogram with Doppler to evaluate cardiac structure, function, and regional wall motion. She may be amenable to other forms of antiarrhythmic  therapy versus ablation.  I will make a referral to the atrial fibrillation clinic.  2.  Valvular heart disease: I will order a 2-D echocardiogram with Doppler to evaluate cardiac structure, function, and regional wall motion.  3.  Right pretibial wound: I will make a referral to a wound specialist.    Disposition: Follow up 3 months   Kate Sable, M.D., F.A.C.C.

## 2019-09-07 ENCOUNTER — Other Ambulatory Visit: Payer: Self-pay

## 2019-09-07 ENCOUNTER — Ambulatory Visit (HOSPITAL_COMMUNITY)
Admission: RE | Admit: 2019-09-07 | Discharge: 2019-09-07 | Disposition: A | Discharge status: Home / Self Care | Payer: Medicare Other | Source: Ambulatory Visit | Attending: Cardiovascular Disease | Admitting: Cardiovascular Disease

## 2019-09-07 DIAGNOSIS — I480 Paroxysmal atrial fibrillation: Secondary | ICD-10-CM | POA: Diagnosis present

## 2019-09-07 NOTE — Progress Notes (Signed)
*  PRELIMINARY RESULTS* Echocardiogram 2D Echocardiogram has been performed.  Kelly Dougherty 09/07/2019, 2:34 PM

## 2019-09-08 ENCOUNTER — Other Ambulatory Visit: Payer: Self-pay

## 2019-09-08 ENCOUNTER — Encounter: Payer: Medicare Other | Attending: Physician Assistant | Admitting: Physician Assistant

## 2019-09-08 DIAGNOSIS — Z7901 Long term (current) use of anticoagulants: Secondary | ICD-10-CM | POA: Diagnosis not present

## 2019-09-08 DIAGNOSIS — I872 Venous insufficiency (chronic) (peripheral): Secondary | ICD-10-CM | POA: Diagnosis not present

## 2019-09-08 DIAGNOSIS — L97812 Non-pressure chronic ulcer of other part of right lower leg with fat layer exposed: Secondary | ICD-10-CM | POA: Insufficient documentation

## 2019-09-08 DIAGNOSIS — I480 Paroxysmal atrial fibrillation: Secondary | ICD-10-CM | POA: Diagnosis not present

## 2019-09-08 NOTE — Progress Notes (Signed)
BREANNON, LEMERY ([id number]) Visit Report for 09/08/2019 Allergy List Details Patient Name: Kelly Dougherty, Kelly Dougherty Date of Service: 09/08/2019 9:45 AM Medical Record Number: [id number] Patient Account Number: [id number] Date of Birth/Sex: 08-Dec-1941 (78 y.o. F) Treating RN: Army Melia Primary Care Laporche Martelle: Vic Blackbird Other Clinician: Referring Dontarious Schaum: Kate Sable Treating Rome Schlauch/Extender: STONE III, HOYT Weeks in Treatment: 0 Allergies Active Allergies No Known Allergies Allergy Notes Electronic Signature(s) Signed: 09/08/2019 11:53:25 AM By: Army Melia Entered By: Army Melia on 09/08/2019 09:56:20 Kelly Dougherty ([id number]) -------------------------------------------------------------------------------- Arrival Information Details Patient Name: Kelly Dougherty Date of Service: 09/08/2019 9:45 AM Medical Record Number: [id number] Patient Account Number: [id number] Date of Birth/Sex: 05-23-1941 (78 y.o. F) Treating RN: Army Melia Primary Care Dandy Lazaro: Vic Blackbird Other Clinician: Referring Carel Carrier: Kate Sable Treating Madlynn Lundeen/Extender: Melburn Hake, HOYT Weeks in Treatment: 0 Visit Information Patient Arrived: Ambulatory Arrival Time: 09:54 Accompanied By: self Transfer Assistance: None Patient Identification Verified: Yes Patient Has Alerts: Yes Patient Alerts: Patient on Blood Thinner Electronic Signature(s) Signed: 09/08/2019 11:53:25 AM By: Army Melia Entered By: Army Melia on 09/08/2019 09:54:58 Kelly Dougherty ([id number]) -------------------------------------------------------------------------------- Clinic Level of Care Assessment Details Patient Name: Kelly Dougherty Date of Service: 09/08/2019 9:45 AM Medical Record Number: [id number] Patient Account Number: [id number] Date of Birth/Sex: 1941/07/11 (78 y.o. F) Treating RN: Montey Hora Primary Care Aland Chestnutt: Vic Blackbird Other Clinician: Referring Tinzlee Craker: Kate Sable Treating Raena Pau/Extender: Melburn Hake, HOYT Weeks in Treatment: 0 Clinic Level of Care Assessment Items TOOL 1 Quantity Score []  - Use when EandM and Procedure is performed on INITIAL visit 0 ASSESSMENTS - Nursing Assessment / Reassessment X - General Physical Exam (combine w/ comprehensive assessment (listed just below) when 1 20 performed on new pt. evals) X- 1 25 Comprehensive Assessment (HX, ROS, Risk Assessments, Wounds Hx, etc.) ASSESSMENTS - Wound and Skin Assessment / Reassessment []  - Dermatologic / Skin Assessment (not related to wound area) 0 ASSESSMENTS - Ostomy and/or Continence Assessment and Care []  - Incontinence Assessment and Management 0 []  - 0 Ostomy Care Assessment and Management (repouching, etc.) PROCESS - Coordination of Care X - Simple Patient / Family Education for ongoing care 1 15 []  - 0 Complex (extensive) Patient / Family Education for ongoing care X- 1 10 Staff obtains Programmer, systems, Records, Test Results / Process Orders []  - 0 Staff telephones HHA, Nursing Homes / Clarify orders / etc []  - 0 Routine Transfer to another Facility (non-emergent condition) []  - 0 Routine Hospital Admission (non-emergent condition) X- 1 15 New Admissions / Biomedical engineer / Ordering NPWT, Apligraf, etc. []  - 0 Emergency Hospital Admission (emergent condition) PROCESS - Special Needs []  - Pediatric / Minor Patient Management 0 []  - 0 Isolation Patient Management []  - 0 Hearing / Language / Visual special needs []  - 0 Assessment of Community assistance (transportation, D/C planning, etc.) []  - 0 Additional assistance / Altered mentation []  - 0 Support Surface(s) Assessment (bed, cushion, seat, etc.) Kelly Dougherty, Kelly S. ([id number]) INTERVENTIONS - Miscellaneous []  - External ear exam 0 []  - 0 Patient Transfer (multiple staff / Civil Service fast streamer / Similar devices) []  - 0 Simple Staple / Suture removal (25 or less) []  - 0 Complex Staple / Suture removal  (26 or more) []  - 0 Hypo/Hyperglycemic Management (do not check if billed separately) X- 1 15 Ankle / Brachial Index (ABI) - do not check if billed separately Has the patient been seen at the hospital within the last three years: Yes Total Score: 100 Level Of Care:  New/Established - Level 3 Electronic Signature(s) Signed: 09/08/2019 2:09:28 PM By: Montey Hora Entered By: Montey Hora on 09/08/2019 10:55:01 Kelly Dougherty ([id number]) -------------------------------------------------------------------------------- Lower Extremity Assessment Details Patient Name: Kelly Dougherty Date of Service: 09/08/2019 9:45 AM Medical Record Number: [id number] Patient Account Number: [id number] Date of Birth/Sex: 27-Nov-1941 (78 y.o. F) Treating RN: Army Melia Primary Care Joesiah Lonon: Vic Blackbird Other Clinician: Referring Brilyn Tuller: Kate Sable Treating Jeannelle Wiens/Extender: Melburn Hake, HOYT Weeks in Treatment: 0 Edema Assessment Assessed: [Left: Yes] [Right: Yes] Edema: [Left: No] [Right: No] Calf Left: Right: Point of Measurement: 30 cm From Medial Instep 32.5 cm 30.6 cm Ankle Left: Right: Point of Measurement: 10 cm From Medial Instep 19.6 cm 20.2 cm Vascular Assessment Pulses: Dorsalis Pedis Palpable: [Left:No] [Right:No] Doppler Audible: [Left:Yes] [Right:Yes] Posterior Tibial Palpable: [Left:No] [Right:No] Doppler Audible: [Left:Yes] [Right:Yes] Blood Pressure: Brachial: [Left:118] [Right:120] Ankle: [Left:Dorsalis Pedis: 140 1.17] [Right:Dorsalis Pedis: 130 1.08] Electronic Signature(s) Signed: 09/08/2019 11:53:25 AM By: Army Melia Entered By: Army Melia on 09/08/2019 10:13:22 Kelly Dougherty ([id number]) -------------------------------------------------------------------------------- Multi Wound Chart Details Patient Name: Kelly Dougherty Date of Service: 09/08/2019 9:45 AM Medical Record Number: [id number] Patient Account Number: [id number] Date of  Birth/Sex: 17-Oct-1941 (78 y.o. F) Treating RN: Montey Hora Primary Care Nikyla Navedo: Vic Blackbird Other Clinician: Referring Javyon Fontan: Kate Sable Treating Juanetta Negash/Extender: Melburn Hake, HOYT Weeks in Treatment: 0 Vital Signs Height(in): 65 Pulse(bpm): Weight(lbs): 110 Blood Pressure(mmHg): Body Mass Index(BMI): 18 Temperature(F): 98.3 Respiratory Rate 16 (breaths/min): Photos: [N/A:N/A] Wound Location: Right Lower Leg - Anterior N/A N/A Wounding Event: Trauma N/A N/A Primary Etiology: Venous Leg Ulcer N/A N/A Comorbid History: Cataracts, Glaucoma, N/A N/A Arrhythmia Date Acquired: 07/28/2018 N/A N/A Weeks of Treatment: 0 N/A N/A Wound Status: Open N/A N/A Measurements L x W x D 0.9x0.6x0.1 N/A N/A (cm) Area (cm) : 0.424 N/A N/A Volume (cm) : 0.042 N/A N/A Classification: Partial Thickness N/A N/A Exudate Amount: Medium N/A N/A Exudate Type: Serosanguineous N/A N/A Exudate Color: red, brown N/A N/A Granulation Amount: Medium (34-66%) N/A N/A Granulation Quality: Red N/A N/A Necrotic Amount: Medium (34-66%) N/A N/A Necrotic Tissue: Eschar, Adherent Slough N/A N/A Exposed Structures: Fat Layer (Subcutaneous N/A N/A Tissue) Exposed: Yes Fascia: No Tendon: No Muscle: No Joint: No Bone: No Epithelialization: None N/A N/A Kelly Dougherty, Kelly Dougherty ([id number]) Treatment Notes Electronic Signature(s) Signed: 09/08/2019 2:09:28 PM By: Montey Hora Entered By: Montey Hora on 09/08/2019 10:39:00 Kelly Dougherty ([id number]) -------------------------------------------------------------------------------- Multi-Disciplinary Care Plan Details Patient Name: Kelly Dougherty Date of Service: 09/08/2019 9:45 AM Medical Record Number: [id number] Patient Account Number: [id number] Date of Birth/Sex: [birth year] (78 y.o. F) Treating RN: Montey Hora Primary Care Tryce Surratt: Vic Blackbird Other Clinician: Referring Arzell Mcgeehan: Kate Sable Treating Sharay Bellissimo/Extender:  Melburn Hake, HOYT Weeks in Treatment: 0 Active Inactive Abuse / Safety / Falls / Self Care Management Nursing Diagnoses: Potential for falls Goals: Patient will remain injury free related to falls Date Initiated: 09/08/2019 Target Resolution Date: 12/02/2019 Goal Status: Active Interventions: Assess fall risk on admission and as needed Notes: Orientation to the Wound Care Program Nursing Diagnoses: Knowledge deficit related to the wound healing center program Goals: Patient/caregiver will verbalize understanding of the [employer] Program Date Initiated: 09/08/2019 Target Resolution Date: 12/02/2019 Goal Status: Active Interventions: Provide education on orientation to the wound center Notes: Venous Leg Ulcer Nursing Diagnoses: Potential for venous Insuffiency (use before diagnosis confirmed) Goals: Patient will maintain optimal edema control Date Initiated: 09/08/2019 Target Resolution Date: 12/02/2019 Goal Status: Active Interventions: Assess peripheral edema status every visit.  Kelly Dougherty, Kelly Dougherty ([id number]) Notes: Wound/Skin Impairment Nursing Diagnoses: Impaired tissue integrity Goals: Ulcer/skin breakdown will heal within 14 weeks Date Initiated: 09/08/2019 Target Resolution Date: 12/02/2019 Goal Status: Active Interventions: Assess patient/caregiver ability to obtain necessary supplies Assess patient/caregiver ability to perform ulcer/skin care regimen upon admission and as needed Assess ulceration(s) every visit Notes: Electronic Signature(s) Signed: 09/08/2019 2:09:28 PM By: Montey Hora Entered By: Montey Hora on 09/08/2019 10:38:47 Kelly Dougherty ([id number]) -------------------------------------------------------------------------------- Pain Assessment Details Patient Name: Kelly Dougherty Date of Service: 09/08/2019 9:45 AM Medical Record Number: [id number] Patient Account Number: [id number] Date of Birth/Sex: September 16, 1941 (78 y.o. F) Treating  RN: Army Melia Primary Care Sameena Artus: Vic Blackbird Other Clinician: Referring Syrah Daughtrey: Kate Sable Treating Neveah Bang/Extender: Melburn Hake, HOYT Weeks in Treatment: 0 Active Problems Location of Pain Severity and Description of Pain Patient Has Paino No Site Locations Pain Management and Medication Current Pain Management: Electronic Signature(s) Signed: 09/08/2019 11:53:25 AM By: Army Melia Entered By: Army Melia on 09/08/2019 09:55:08 Kelly Dougherty ([id number]) -------------------------------------------------------------------------------- Wound Assessment Details Patient Name: Kelly Dougherty Date of Service: 09/08/2019 9:45 AM Medical Record Number: [id number] Patient Account Number: [id number] Date of Birth/Sex: 1941/12/23 (78 y.o. F) Treating RN: Army Melia Primary Care Paityn Balsam: Vic Blackbird Other Clinician: Referring Reginal Wojcicki: Kate Sable Treating Orlanda Lemmerman/Extender: Melburn Hake, HOYT Weeks in Treatment: 0 Wound Status Wound Number: 1 Primary Etiology: Venous Leg Ulcer Wound Location: Right Lower Leg - Anterior Wound Status: Open Wounding Event: Trauma Comorbid History: Cataracts, Glaucoma, Arrhythmia Date Acquired: 07/28/2018 Weeks Of Treatment: 0 Clustered Wound: No Photos Wound Measurements Length: (cm) 0.9 % Reduction Width: (cm) 0.6 % Reduction Depth: (cm) 0.1 Epithelializ Area: (cm) 0.424 Tunneling: Volume: (cm) 0.042 Undermining in Area: in Volume: ation: None No : No Wound Description Classification: Partial Thickness Foul Odor Af Exudate Amount: Medium Slough/Fibri Exudate Type: Serosanguineous Exudate Color: red, brown ter Cleansing: No no Yes Wound Bed Granulation Amount: Medium (34-66%) Exposed Structure Granulation Quality: Red Fascia Exposed: No Necrotic Amount: Medium (34-66%) Fat Layer (Subcutaneous Tissue) Exposed: Yes Necrotic Quality: Eschar, Adherent Slough Tendon Exposed: No Muscle Exposed: No Joint  Exposed: No Bone Exposed: No Electronic Signature(s) Signed: 09/08/2019 11:53:25 AM By: Melanee Left ([id number]) Entered By: Army Melia on 09/08/2019 10:04:59 Kelly Dougherty ([id number]) -------------------------------------------------------------------------------- Vitals Details Patient Name: Kelly Dougherty Date of Service: 09/08/2019 9:45 AM Medical Record Number: [id number] Patient Account Number: [id number] Date of Birth/Sex: Jul 24, 1941 (78 y.o. F) Treating RN: Army Melia Primary Care Trell Secrist: Vic Blackbird Other Clinician: Referring Azalie Harbeck: Kate Sable Treating Teale Goodgame/Extender: Melburn Hake, HOYT Weeks in Treatment: 0 Vital Signs Time Taken: 09:55 Temperature (F): 98.3 Height (in): 65 Respiratory Rate (breaths/min): 16 Source: Stated Reference Range: 80 - 120 mg / dl Weight (lbs): 110 Source: Stated Body Mass Index (BMI): 18.3 Electronic Signature(s) Signed: 09/08/2019 11:53:25 AM By: Army Melia Entered By: Army Melia on 09/08/2019 09:55:39

## 2019-09-08 NOTE — Progress Notes (Signed)
BURNADETTE, SHIMA ([id number]) Visit Report for 09/08/2019 Abuse/Suicide Risk Screen Details Patient Name: Kelly Dougherty, Kelly Dougherty Date of Service: 09/08/2019 9:45 AM Medical Record Number: [id number] Patient Account Number: [id number] Date of Birth/Sex: October 29, 1941 (78 y.o. F) Treating RN: Army Melia Primary Care Jakera Beaupre: Vic Blackbird Other Clinician: Referring Darbi Chandran: Kate Sable Treating Fredda Clarida/Extender: Melburn Hake, HOYT Weeks in Treatment: 0 Abuse/Suicide Risk Screen Items Answer ABUSE RISK SCREEN: Has anyone close to you tried to hurt or harm you recentlyo No Do you feel uncomfortable with anyone in your familyo No Has anyone forced you do things that you didnot want to doo No Electronic Signature(s) Signed: 09/08/2019 11:53:25 AM By: Army Melia Entered By: Army Melia on 09/08/2019 10:00:10 Kelly Dougherty ([id number]) -------------------------------------------------------------------------------- Activities of Daily Living Details Patient Name: Kelly Dougherty Date of Service: 09/08/2019 9:45 AM Medical Record Number: [id number] Patient Account Number: [id number] Date of Birth/Sex: 30-May-1941 (78 y.o. F) Treating RN: Army Melia Primary Care Venetia Prewitt: Vic Blackbird Other Clinician: Referring Jerita Wimbush: Kate Sable Treating Dayven Linsley/Extender: Melburn Hake, HOYT Weeks in Treatment: 0 Activities of Daily Living Items Answer Activities of Daily Living (Please select one for each item) Drive Automobile Completely Able Take Medications Completely Able Use Telephone Completely Able Care for Appearance Completely Able Use Toilet Completely Able Bath / Shower Completely Able Dress Self Completely Able Feed Self Completely Able Walk Completely Able Get In / Out Bed Completely Able Housework Completely Able Prepare Meals Completely Cocke for Self Completely Able Electronic Signature(s) Signed: 09/08/2019 11:53:25 AM By: Army Melia Entered By: Army Melia on 09/08/2019 10:00:27 Kelly Dougherty ([id number]) -------------------------------------------------------------------------------- Education Screening Details Patient Name: Kelly Dougherty Date of Service: 09/08/2019 9:45 AM Medical Record Number: [id number] Patient Account Number: [id number] Date of Birth/Sex: May 24, 1941 (78 y.o. F) Treating RN: Army Melia Primary Care Oreoluwa Gilmer: Vic Blackbird Other Clinician: Referring Brodnax Wenzlick: Kate Sable Treating Zyia Kaneko/Extender: Melburn Hake, HOYT Weeks in Treatment: 0 Primary Learner Assessed: Patient Learning Preferences/Education Level/Primary Language Learning Preference: Explanation, Demonstration Highest Education Level: College or Above Preferred Language: English Cognitive Barrier Language Barrier: No Translator Needed: No Memory Deficit: No Emotional Barrier: No Cultural/Religious Beliefs Affecting Medical Care: No Physical Barrier Impaired Vision: No Impaired Hearing: No Decreased Hand dexterity: No Knowledge/Comprehension Knowledge Level: High Comprehension Level: High Ability to understand written High instructions: Ability to understand verbal High instructions: Motivation Anxiety Level: Calm Cooperation: Cooperative Education Importance: Acknowledges Need Interest in Health Problems: Asks Questions Perception: Coherent Willingness to Engage in Self- High Management Activities: Readiness to Engage in Self- High Management Activities: Electronic Signature(s) Signed: 09/08/2019 11:53:25 AM By: Army Melia Entered By: Army Melia on 09/08/2019 10:00:47 Kelly Dougherty ([id number]) -------------------------------------------------------------------------------- Fall Risk Assessment Details Patient Name: Kelly Dougherty Date of Service: 09/08/2019 9:45 AM Medical Record Number: [id number] Patient Account Number: [id number] Date of Birth/Sex: [birth year] (78 y.o.  F) Treating RN: Army Melia Primary Care Amyre Segundo: Vic Blackbird Other Clinician: Referring Teckla Christiansen: Kate Sable Treating Uva Runkel/Extender: Melburn Hake, HOYT Weeks in Treatment: 0 Fall Risk Assessment Items Have you had 2 or more falls in the last 12 monthso 0 No Have you had any fall that resulted in injury in the last 12 monthso 0 No FALLS RISK SCREEN History of falling - immediate or within 3 months 0 No Secondary diagnosis (Do you have 2 or more medical diagnoseso) 0 No Ambulatory aid None/bed rest/wheelchair/nurse 0 No Crutches/cane/walker 0 No Furniture 0 No Intravenous therapy Access/Saline/Heparin Lock 0 No Gait/Transferring Normal/ bed rest/ wheelchair 0 No  Weak (short steps with or without shuffle, stooped but able to lift head while 0 No walking, may seek support from furniture) Impaired (short steps with shuffle, may have difficulty arising from chair, head 0 No down, impaired balance) Mental Status Oriented to own ability 0 No Electronic Signature(s) Signed: 09/08/2019 11:53:25 AM By: Army Melia Entered By: Army Melia on 09/08/2019 10:00:54 Kelly Dougherty ([id number]) -------------------------------------------------------------------------------- Foot Assessment Details Patient Name: Kelly Dougherty Date of Service: 09/08/2019 9:45 AM Medical Record Number: [id number] Patient Account Number: [id number] Date of Birth/Sex: [birth year] (78 y.o. F) Treating RN: Army Melia Primary Care Keontay Vora: Vic Blackbird Other Clinician: Referring Amon Costilla: Kate Sable Treating Alisah Grandberry/Extender: Melburn Hake, HOYT Weeks in Treatment: 0 Foot Assessment Items Site Locations + = Sensation present, - = Sensation absent, C = Callus, U = Ulcer R = Redness, W = Warmth, M = Maceration, PU = Pre-ulcerative lesion F = Fissure, S = Swelling, D = Dryness Assessment Right: Left: Other Deformity: No No Prior Foot Ulcer: No No Prior Amputation: No No Charcot  Joint: No No Ambulatory Status: Ambulatory Without Help Gait: Steady Electronic Signature(s) Signed: 09/08/2019 11:53:25 AM By: Army Melia Entered By: Army Melia on 09/08/2019 10:02:49 Kelly Dougherty ([id number]) -------------------------------------------------------------------------------- Nutrition Risk Screening Details Patient Name: Kelly Dougherty Date of Service: 09/08/2019 9:45 AM Medical Record Number: [id number] Patient Account Number: [id number] Date of Birth/Sex: 08/31/1941 (78 y.o. F) Treating RN: Army Melia Primary Care Manasvini Whatley: Vic Blackbird Other Clinician: Referring Alfa Leibensperger: Kate Sable Treating Devan Babino/Extender: Melburn Hake, HOYT Weeks in Treatment: 0 Height (in): 65 Weight (lbs): 110 Body Mass Index (BMI): 18.3 Nutrition Risk Screening Items Score Screening NUTRITION RISK SCREEN: I have an illness or condition that made me change the kind and/or amount of 0 No food I eat I eat fewer than two meals per day 0 No I eat few fruits and vegetables, or milk products 0 No I have three or more drinks of beer, liquor or wine almost every day 0 No I have tooth or mouth problems that make it hard for me to eat 0 No I don't always have enough money to buy the food I need 0 No I eat alone most of the time 0 No I take three or more different prescribed or over-the-counter drugs a day 0 No Without wanting to, I have lost or gained 10 pounds in the last six months 0 No I am not always physically able to shop, cook and/or feed myself 0 No Nutrition Protocols Good Risk Protocol 0 No interventions needed Moderate Risk Protocol High Risk Proctocol Risk Level: Good Risk Score: 0 Electronic Signature(s) Signed: 09/08/2019 11:53:25 AM By: Army Melia Entered By: Army Melia on 09/08/2019 10:01:00

## 2019-09-08 NOTE — Progress Notes (Signed)
Kelly, Dougherty ([id number]) Visit Report for 09/08/2019 Biopsy Details Patient Name: Kelly Dougherty, Kelly Dougherty Date of Service: 09/08/2019 9:45 AM Medical Record Number: [id number] Patient Account Number: [id number] Date of Birth/Sex: Aug 28, 1941 (78 y.o. F) Treating RN: Montey Hora Primary Care Provider: Vic Blackbird Other Clinician: Referring Provider: Kate Sable Treating Provider/Extender: Melburn Hake, HOYT Weeks in Treatment: 0 Biopsy Performed for: Wound #1 Right, Anterior Lower Leg Location(s): Wound Bed, Wound Margin Performed By: Physician STONE III, HOYT E., PA-C Tissue Punch: Yes Size (mm): 5 Number of Specimens Taken: 1 Specimen Sent To Pathology: Yes Level of Consciousness (Pre-procedure): Awake and Alert Pre-procedure Verification/Time-Out Taken: Yes - 10:46 Pain Control: Lidocaine Injectable Lidocaine Percent: 2% Instrument: Forceps, Scissors Hemostasis Achieved: Silver Nitrate Procedural Pain: 0 Post Procedural Pain: 0 Response to Treatment: Procedure was tolerated well Level of Consciousness (Post-procedure): Awake and Alert Post Procedure Diagnosis Same as Pre-procedure Notes 1.25ml injectable lidocaine Electronic Signature(s) Signed: 09/08/2019 1:33:12 PM By: Worthy Keeler PA-C Signed: 09/08/2019 2:09:28 PM By: Montey Hora Entered By: Montey Hora on 09/08/2019 10:50:27 Kelly Dougherty ([id number]) -------------------------------------------------------------------------------- Chief Complaint Document Details Patient Name: Kelly Dougherty Date of Service: 09/08/2019 9:45 AM Medical Record Number: [id number] Patient Account Number: [id number] Date of Birth/Sex: 09-23-1941 (78 y.o. F) Treating RN: Montey Hora Primary Care Provider: Vic Blackbird Other Clinician: Referring Provider: Kate Sable Treating Provider/Extender: Melburn Hake, HOYT Weeks in Treatment: 0 Information Obtained from: Patient Chief Complaint Right LE Ulcer Electronic  Signature(s) Signed: 09/08/2019 10:24:35 AM By: Worthy Keeler PA-C Previous Signature: 09/08/2019 10:24:17 AM Version By: Worthy Keeler PA-C Entered By: Worthy Keeler on 09/08/2019 10:24:34 Kelly Dougherty ([id number]) -------------------------------------------------------------------------------- Debridement Details Patient Name: Kelly Dougherty Date of Service: 09/08/2019 9:45 AM Medical Record Number: [id number] Patient Account Number: [id number] Date of Birth/Sex: 07-02-1941 (78 y.o. F) Treating RN: Montey Hora Primary Care Provider: Vic Blackbird Other Clinician: Referring Provider: Kate Sable Treating Provider/Extender: Melburn Hake, HOYT Weeks in Treatment: 0 Debridement Performed for Wound #1 Right,Anterior Lower Leg Assessment: Performed By: Physician STONE III, HOYT E., PA-C Debridement Type: Chemical/Enzymatic/Mechanical Agent Used: saline and gauze Severity of Tissue Pre Fat layer exposed Debridement: Level of Consciousness (Pre- Awake and Alert procedure): Pre-procedure Verification/Time Yes - 10:50 Out Taken: Start Time: 10:50 Pain Control: Lidocaine 4% Topical Solution Instrument: Other : saline and gauze Bleeding: None End Time: 10:51 Procedural Pain: 0 Post Procedural Pain: 0 Response to Treatment: Procedure was tolerated well Level of Consciousness Awake and Alert (Post-procedure): Post Debridement Measurements of Total Wound Length: (cm) 0.9 Width: (cm) 0.6 Depth: (cm) 0.1 Volume: (cm) 0.042 Character of Wound/Ulcer Post Debridement: Improved Severity of Tissue Post Debridement: Fat layer exposed Post Procedure Diagnosis Same as Pre-procedure Electronic Signature(s) Signed: 09/08/2019 1:33:12 PM By: Worthy Keeler PA-C Signed: 09/08/2019 2:09:28 PM By: Montey Hora Entered By: Montey Hora on 09/08/2019 10:53:33 Kelly Dougherty  ([id number]) -------------------------------------------------------------------------------- HPI Details Patient Name: Kelly Dougherty Date of Service: 09/08/2019 9:45 AM Medical Record Number: [id number] Patient Account Number: [id number] Date of Birth/Sex: December 30, 1940 (78 y.o. F) Treating RN: Montey Hora Primary Care Provider: Vic Blackbird Other Clinician: Referring Provider: Kate Sable Treating Provider/Extender: Melburn Hake, HOYT Weeks in Treatment: 0 History of Present Illness HPI Description: 09/08/2019 patient presents today for initial evaluation in our clinic concerning issues that she has been having with her lower extremity anteriorly. She states that she has a wound that she has had a very difficult time trying to get to heal. This began greater than a  year ago and she states it is never really closed completely. Fortunately there is no signs of infection but she is obviously very frustrated with the fact that she is having a difficult time getting this area to heal. No fevers, chills, nausea, vomiting, or diarrhea. The patient does have a history of atrial fibrillation for which she is on Eliquis. She is also had an echocardiogram which showed an ejection fraction of 65%. It was her cardiologist that referred her to [place]. She does not have any signs of peripheral vascular disease which is good news. With that being said she also does not have diabetes. This wound actually is somewhat suspicious to me for the possibility of being a cancerous lesion especially with a history of not really closing over such a long time despite the fact she really does not appear to be too swollen to me today I am not thinking that this is a traditional venous leg ulcer. Electronic Signature(s) Signed: 09/08/2019 11:00:04 AM By: Worthy Keeler PA-C Entered By: Worthy Keeler on 09/08/2019 11:00:04 Kelly Dougherty  ([id number]) -------------------------------------------------------------------------------- Physical Exam Details Patient Name: Kelly Dougherty Date of Service: 09/08/2019 9:45 AM Medical Record Number: [id number] Patient Account Number: [id number] Date of Birth/Sex: [birth year] (78 y.o. F) Treating RN: Montey Hora Primary Care Provider: Vic Blackbird Other Clinician: Referring Provider: Kate Sable Treating Provider/Extender: Melburn Hake, HOYT Weeks in Treatment: 0 Constitutional pulse regular and within target range for patient.Marland Kitchen respirations regular, non-labored and within target range for patient.Marland Kitchen temperature within target range for patient.. Well-nourished and well-hydrated in no acute distress. Eyes conjunctiva clear no eyelid edema noted. pupils equal round and reactive to light and accommodation. Ears, Nose, Mouth, and Throat no gross abnormality of ear auricles or external auditory canals. normal hearing noted during conversation. mucus membranes moist. Respiratory normal breathing without difficulty. clear to auscultation bilaterally. Cardiovascular regular rate and rhythm with normal S1, S2. 1+ dorsalis pedis/posterior tibialis pulses. no clubbing, cyanosis, significant edema, <3 sec cap refill. Gastrointestinal (GI) soft, non-tender, non-distended, +BS. no ventral hernia noted. Musculoskeletal normal gait and posture. no significant deformity or arthritic changes, no loss or range of motion, no clubbing. Psychiatric this patient is able to make decisions and demonstrates good insight into disease process. Alert and Oriented x 3. pleasant and cooperative. Notes Upon inspection patient's wound bed actually appears to have fairly good granulation with some epithelization as well. With that being said it also has somewhat of an irregular appearance both in the immediate wound bed region as well as in the immediate periwound and has me somewhat concerned about the  possibility of this potentially being a skin cancer. With that being said I think that it would be a good idea for [place] to consider biopsy and the patient was in agreement with that plan. For that reason after obtaining consent I did use 1.5 cc of lidocaine 2% to inject into the area in question. Subsequently I then went ahead and utilized a 5 mm punch biopsy to obtain a sample to send for pathology. She tolerated this procedure without complication only discomfort she really had was when we were actually injecting her to numb the area past that she really did not have any other discomfort at that time. Obviously that is excellent news. Electronic Signature(s) Signed: 09/08/2019 1:30:44 PM By: Worthy Keeler PA-C Entered By: Worthy Keeler on 09/08/2019 13:30:43 Kelly Dougherty ([id number]) -------------------------------------------------------------------------------- Physician Orders Details Patient Name: Kelly Dougherty Date of Service: 09/08/2019 9:45  AM Medical Record Number: [id number] Patient Account Number: [id number] Date of Birth/Sex: 01/11/1941 (78 y.o. F) Treating RN: Montey Hora Primary Care Provider: Vic Blackbird Other Clinician: Referring Provider: Kate Sable Treating Provider/Extender: Melburn Hake, HOYT Weeks in Treatment: 0 Verbal / Phone Orders: No Diagnosis Coding ICD-10 Coding Code Description I87.2 Venous insufficiency (chronic) (peripheral) L97.812 Non-pressure chronic ulcer of other part of right lower leg with fat layer exposed I48.0 Paroxysmal atrial fibrillation Z79.01 Long term (current) use of anticoagulants Wound Cleansing Wound #1 Right,Anterior Lower Leg o Clean wound with Normal Saline. o Dial antibacterial soap, wash wounds, rinse and pat dry prior to dressing wounds o May Shower, gently pat wound dry prior to applying new dressing. Anesthetic (add to Medication List) Wound #1 Right,Anterior Lower Leg o Injected 2% Xylocaine MPF  prior to debridement (In Clinic Only). Primary Wound Dressing Wound #1 Right,Anterior Lower Leg o Silver Alginate Secondary Dressing Wound #1 Right,Anterior Lower Leg o Boardered Foam Dressing Dressing Change Frequency Wound #1 Right,Anterior Lower Leg o Change dressing every other day. Follow-up Appointments Wound #1 Right,Anterior Lower Leg o Return Appointment in 1 week. Laboratory o Tissue Pathology biopsy report (PATH) oooo LOINC Code: [phone] oooo Convenience Name: [facility] Path Bx report JUDEA, PALLESCHI ([id number]) Electronic Signature(s) Signed: 09/08/2019 1:33:12 PM By: Worthy Keeler PA-C Signed: 09/08/2019 2:09:28 PM By: Montey Hora Entered By: Montey Hora on 09/08/2019 10:51:43 OLUWAJOMILOJU, LANCELLOTTI ([id number]) -------------------------------------------------------------------------------- Problem List Details Patient Name: Kelly Dougherty Date of Service: 09/08/2019 9:45 AM Medical Record Number: [id number] Patient Account Number: [id number] Date of Birth/Sex: Dec 07, 1941 (78 y.o. F) Treating RN: Montey Hora Primary Care Provider: Vic Blackbird Other Clinician: Referring Provider: Kate Sable Treating Provider/Extender: Melburn Hake, HOYT Weeks in Treatment: 0 Active Problems ICD-10 Evaluated Encounter Code Description Active Date Today Diagnosis I87.2 Venous insufficiency (chronic) (peripheral) 09/08/2019 No Yes L97.812 Non-pressure chronic ulcer of other part of right lower leg 09/08/2019 No Yes with fat layer exposed I48.0 Paroxysmal atrial fibrillation 09/08/2019 No Yes Z79.01 Long term (current) use of anticoagulants 09/08/2019 No Yes Inactive Problems Resolved Problems Electronic Signature(s) Signed: 09/08/2019 10:24:10 AM By: Worthy Keeler PA-C Entered By: Worthy Keeler on 09/08/2019 10:24:10 Kelly Dougherty ([id number]) -------------------------------------------------------------------------------- Progress Note Details Patient  Name: Kelly Dougherty Date of Service: 09/08/2019 9:45 AM Medical Record Number: [id number] Patient Account Number: [id number] Date of Birth/Sex: [birth year] (78 y.o. F) Treating RN: Montey Hora Primary Care Provider: Vic Blackbird Other Clinician: Referring Provider: Kate Sable Treating Provider/Extender: Melburn Hake, HOYT Weeks in Treatment: 0 Subjective Chief Complaint Information obtained from Patient Right LE Ulcer History of Present Illness (HPI) 09/08/2019 patient presents today for initial evaluation in our clinic concerning issues that she has been having with her lower extremity anteriorly. She states that she has a wound that she has had a very difficult time trying to get to heal. This began greater than a year ago and she states it is never really closed completely. Fortunately there is no signs of infection but she is obviously very frustrated with the fact that she is having a difficult time getting this area to heal. No fevers, chills, nausea, vomiting, or diarrhea. The patient does have a history of atrial fibrillation for which she is on Eliquis. She is also had an echocardiogram which showed an ejection fraction of 65%. It was her cardiologist that referred her to [place]. She does not have any signs of peripheral vascular disease which is good news. With that being said she also does not  have diabetes. This wound actually is somewhat suspicious to me for the possibility of being a cancerous lesion especially with a history of not really closing over such a long time despite the fact she really does not appear to be too swollen to me today I am not thinking that this is a traditional venous leg ulcer. Patient History Information obtained from Patient. Allergies No Known Allergies Family History No family history of Cancer, Diabetes, Heart Disease, Hereditary Spherocytosis, Hypertension, Kidney Disease, Lung Disease, Seizures, Stroke, Thyroid Problems,  Tuberculosis. Social History Never smoker, Marital Status - Married, Alcohol Use - Daily, Drug Use - No History, Caffeine Use - Daily. Medical History Eyes Patient has history of Cataracts - bilateral removal, Glaucoma Ear/Nose/Mouth/Throat Denies history of Chronic sinus problems/congestion, Middle ear problems Hematologic/Lymphatic Denies history of Anemia, Hemophilia, Human Immunodeficiency Virus, Lymphedema Respiratory Denies history of Aspiration, Asthma, Chronic Obstructive Pulmonary Disease (COPD), Pneumothorax, Sleep Apnea, Tuberculosis Cardiovascular Patient has history of Arrhythmia - A Fib Denies history of Angina, Congestive Heart Failure, Coronary Artery Disease, Deep Vein Thrombosis, Hypertension, Hypotension, Myocardial Infarction, Peripheral Arterial Disease, Peripheral Venous Disease, Phlebitis, Vasculitis Gastrointestinal Zara, Severina S. ([id number]) Denies history of Cirrhosis , Colitis, Crohn s, Hepatitis A, Hepatitis B, Hepatitis C Endocrine Denies history of Type I Diabetes, Type II Diabetes Genitourinary Denies history of End Stage Renal Disease Immunological Denies history of Lupus Erythematosus, Raynaud s, Scleroderma Integumentary (Skin) Denies history of History of Burn, History of pressure wounds Musculoskeletal Denies history of Gout, Rheumatoid Arthritis, Osteoarthritis, Osteomyelitis Neurologic Denies history of Dementia, Neuropathy, Quadriplegia, Paraplegia, Seizure Disorder Oncologic Denies history of Received Chemotherapy, Received Radiation Psychiatric Denies history of Anorexia/bulimia, Confinement Anxiety Review of Systems (ROS) Eyes Complains or has symptoms of Glasses / Contacts - glasses. Denies complaints or symptoms of Dry Eyes, Vision Changes. Ear/Nose/Mouth/Throat Denies complaints or symptoms of Difficult clearing ears, Sinusitis. Hematologic/Lymphatic Denies complaints or symptoms of Bleeding / Clotting Disorders, Human  Immunodeficiency Virus. Respiratory Denies complaints or symptoms of Chronic or frequent coughs, Shortness of Breath. Cardiovascular Denies complaints or symptoms of Chest pain, LE edema. Gastrointestinal Denies complaints or symptoms of Frequent diarrhea, Nausea, Vomiting. Endocrine Denies complaints or symptoms of Hepatitis, Thyroid disease, Polydypsia (Excessive Thirst). Genitourinary Denies complaints or symptoms of Kidney failure/ Dialysis, Incontinence/dribbling. Immunological Denies complaints or symptoms of Hives, Itching. Integumentary (Skin) Complains or has symptoms of Wounds. Denies complaints or symptoms of Bleeding or bruising tendency, Breakdown, Swelling. Musculoskeletal Denies complaints or symptoms of Muscle Pain, Muscle Weakness. Neurologic Denies complaints or symptoms of Numbness/parasthesias, Focal/Weakness. Psychiatric Denies complaints or symptoms of Anxiety, Claustrophobia. Objective Constitutional Stanislawski, Keneisha S. ([id number]) pulse regular and within target range for patient.Marland Kitchen respirations regular, non-labored and within target range for patient.Marland Kitchen temperature within target range for patient.. Well-nourished and well-hydrated in no acute distress. Vitals Time Taken: 9:55 AM, Height: 65 in, Source: Stated, Weight: 110 lbs, Source: Stated, BMI: 18.3, Temperature: 98.3 F, Respiratory Rate: 16 breaths/min. Eyes conjunctiva clear no eyelid edema noted. pupils equal round and reactive to light and accommodation. Ears, Nose, Mouth, and Throat no gross abnormality of ear auricles or external auditory canals. normal hearing noted during conversation. mucus membranes moist. Respiratory normal breathing without difficulty. clear to auscultation bilaterally. Cardiovascular regular rate and rhythm with normal S1, S2. 1+ dorsalis pedis/posterior tibialis pulses. no clubbing, cyanosis, significant edema, Gastrointestinal (GI) soft, non-tender, non-distended, +BS.  no ventral hernia noted. Musculoskeletal normal gait and posture. no significant deformity or arthritic changes, no loss or range of motion, no clubbing. Psychiatric this  patient is able to make decisions and demonstrates good insight into disease process. Alert and Oriented x 3. pleasant and cooperative. General Notes: Upon inspection patient's wound bed actually appears to have fairly good granulation with some epithelization as well. With that being said it also has somewhat of an irregular appearance both in the immediate wound bed region as well as in the immediate periwound and has me somewhat concerned about the possibility of this potentially being a skin cancer. With that being said I think that it would be a good idea for [place] to consider biopsy and the patient was in agreement with that plan. For that reason after obtaining consent I did use 1.5 cc of lidocaine 2% to inject into the area in question. Subsequently I then went ahead and utilized a 5 mm punch biopsy to obtain a sample to send for pathology. She tolerated this procedure without complication only discomfort she really had was when we were actually injecting her to numb the area past that she really did not have any other discomfort at that time. Obviously that is excellent news. Integumentary (Hair, Skin) Wound #1 status is Open. Original cause of wound was Trauma. The wound is located on the Right,Anterior Lower Leg. The wound measures 0.9cm length x 0.6cm width x 0.1cm depth; 0.424cm^2 area and 0.042cm^3 volume. There is Fat Layer (Subcutaneous Tissue) Exposed exposed. There is no tunneling or undermining noted. There is a medium amount of serosanguineous drainage noted. There is medium (34-66%) red granulation within the wound bed. There is a medium (34- 66%) amount of necrotic tissue within the wound bed including Eschar and Adherent Slough. Assessment Active Problems ICD-10 Venous insufficiency (chronic)  (peripheral) Non-pressure chronic ulcer of other part of right lower leg with fat layer exposed Paroxysmal atrial fibrillation Long term (current) use of anticoagulants Winward, Pessy S. ([id number]) Procedures Wound #1 Pre-procedure diagnosis of Wound #1 is a Venous Leg Ulcer located on the Right,Anterior Lower Leg .Severity of Tissue Pre Debridement is: Fat layer exposed. There was a Chemical/Enzymatic/Mechanical debridement performed by STONE III, HOYT E., PA-C. With the following instrument(s): saline and gauze after achieving pain control using Lidocaine 4% Topical Solution. Other agent used was saline and gauze. A time out was conducted at 10:50, prior to the start of the procedure. There was no bleeding. The procedure was tolerated well with a pain level of 0 throughout and a pain level of 0 following the procedure. Post Debridement Measurements: 0.9cm length x 0.6cm width x 0.1cm depth; 0.042cm^3 volume. Character of Wound/Ulcer Post Debridement is improved. Severity of Tissue Post Debridement is: Fat layer exposed. Post procedure Diagnosis Wound #1: Same as Pre-Procedure Pre-procedure diagnosis of Wound #1 is a Venous Leg Ulcer located on the Right, Anterior Lower Leg . There was a biopsy performed by STONE III, HOYT E., PA-C. There was a biopsy performed on Wound Bed, Wound Margin. The skin was cleansed and prepped with anti-septic followed by pain control using Lidocaine Injectable: 2%. Utilizing a 5 mm tissue punch, tissue was removed at its base with the following instrument(s): Forceps and Scissors and sent to pathology. A time out was conducted at 10:46, prior to the start of the procedure. The procedure was tolerated well with a pain level of 0 throughout and a pain level of 0 following the procedure. Post procedure Diagnosis Wound #1: Same as Pre-Procedure General Notes: 1.8ml injectable lidocaine. Plan Wound Cleansing: Wound #1 Right,Anterior Lower Leg: Clean wound with  Normal Saline. Dial antibacterial soap, wash  wounds, rinse and pat dry prior to dressing wounds May Shower, gently pat wound dry prior to applying new dressing. Anesthetic (add to Medication List): Wound #1 Right,Anterior Lower Leg: Injected 2% Xylocaine MPF prior to debridement (In Clinic Only). Primary Wound Dressing: Wound #1 Right,Anterior Lower Leg: Silver Alginate Secondary Dressing: Wound #1 Right,Anterior Lower Leg: Boardered Foam Dressing Dressing Change Frequency: Wound #1 Right,Anterior Lower Leg: Change dressing every other day. Follow-up Appointments: Wound #1 Right,Anterior Lower Leg: Return Appointment in 1 week. Laboratory ordered were: [facility] Path Bx report DEAH, OTSUKA. ([id number]) 1. At this time will get a go ahead and initiate treatment with a silver alginate dressing for the next week we may use something different next week but with the chance of increased bleeding I think the alginate is a much better choice currently. 2. I am going to recommend as well that we just utilize over top of this a border foam dressing I think that that is appropriate and again the patient can actually go ahead and and change his dressing on her own without any complication. 3. Depending on the results of the pathology report I will make recommendations as to whether or not she needs to see a skin surgeon in order to see about possibly Mohs surgery. That would be her best option if indeed this does come back showing some type of carcinoma. We will see patient back for reevaluation in 1 week here in the clinic. If anything worsens or changes patient will contact our office for additional recommendations. Electronic Signature(s) Signed: 09/08/2019 1:32:13 PM By: Worthy Keeler PA-C Entered By: Worthy Keeler on 09/08/2019 13:32:13 RODERICA, MAHIN ([id number]) -------------------------------------------------------------------------------- ROS/PFSH Details Patient Name: Kelly Dougherty Date of Service: 09/08/2019 9:45 AM Medical Record Number: [id number] Patient Account Number: [id number] Date of Birth/Sex: Jul 26, 1941 (78 y.o. F) Treating RN: Army Melia Primary Care Provider: Vic Blackbird Other Clinician: Referring Provider: Kate Sable Treating Provider/Extender: Melburn Hake, HOYT Weeks in Treatment: 0 Information Obtained From Patient Eyes Complaints and Symptoms: Positive for: Glasses / Contacts - glasses Negative for: Dry Eyes; Vision Changes Medical History: Positive for: Cataracts - bilateral removal; Glaucoma Ear/Nose/Mouth/Throat Complaints and Symptoms: Negative for: Difficult clearing ears; Sinusitis Medical History: Negative for: Chronic sinus problems/congestion; Middle ear problems Hematologic/Lymphatic Complaints and Symptoms: Negative for: Bleeding / Clotting Disorders; Human Immunodeficiency Virus Medical History: Negative for: Anemia; Hemophilia; Human Immunodeficiency Virus; Lymphedema Respiratory Complaints and Symptoms: Negative for: Chronic or frequent coughs; Shortness of Breath Medical History: Negative for: Aspiration; Asthma; Chronic Obstructive Pulmonary Disease (COPD); Pneumothorax; Sleep Apnea; Tuberculosis Cardiovascular Complaints and Symptoms: Negative for: Chest pain; LE edema Medical History: Positive for: Arrhythmia - A Fib Negative for: Angina; Congestive Heart Failure; Coronary Artery Disease; Deep Vein Thrombosis; Hypertension; Hypotension; Myocardial Infarction; Peripheral Arterial Disease; Peripheral Venous Disease; Phlebitis; Vasculitis Gastrointestinal Complaints and Symptoms: Negative for: Frequent diarrhea; Nausea; Vomiting Caylor, Dakari S. ([id number]) Medical History: Negative for: Cirrhosis ; Colitis; Crohnos; Hepatitis A; Hepatitis B; Hepatitis C Endocrine Complaints and Symptoms: Negative for: Hepatitis; Thyroid disease; Polydypsia (Excessive Thirst) Medical History: Negative for: Type  I Diabetes; Type II Diabetes Genitourinary Complaints and Symptoms: Negative for: Kidney failure/ Dialysis; Incontinence/dribbling Medical History: Negative for: End Stage Renal Disease Immunological Complaints and Symptoms: Negative for: Hives; Itching Medical History: Negative for: Lupus Erythematosus; Raynaudos; Scleroderma Integumentary (Skin) Complaints and Symptoms: Positive for: Wounds Negative for: Bleeding or bruising tendency; Breakdown; Swelling Medical History: Negative for: History of Burn; History of pressure wounds Musculoskeletal Complaints and Symptoms: Negative  for: Muscle Pain; Muscle Weakness Medical History: Negative for: Gout; Rheumatoid Arthritis; Osteoarthritis; Osteomyelitis Neurologic Complaints and Symptoms: Negative for: Numbness/parasthesias; Focal/Weakness Medical History: Negative for: Dementia; Neuropathy; Quadriplegia; Paraplegia; Seizure Disorder Psychiatric Complaints and Symptoms: Negative for: Anxiety; Claustrophobia Medical HistoryYANIQUE, KOLO ([id number]) Negative for: Anorexia/bulimia; Confinement Anxiety Oncologic Medical History: Negative for: Received Chemotherapy; Received Radiation HBO Extended History Items Eyes: Eyes: Cataracts Glaucoma Immunizations Pneumococcal Vaccine: Received Pneumococcal Vaccination: Yes Implantable Devices None Family and Social History Cancer: No; Diabetes: No; Heart Disease: No; Hereditary Spherocytosis: No; Hypertension: No; Kidney Disease: No; Lung Disease: No; Seizures: No; Stroke: No; Thyroid Problems: No; Tuberculosis: No; Never smoker; Marital Status - Married; Alcohol Use: Daily; Drug Use: No History; Caffeine Use: Daily; Financial Concerns: No; Food, Clothing or Shelter Needs: No; Support System Lacking: No; Transportation Concerns: No Electronic Signature(s) Signed: 09/08/2019 11:53:25 AM By: Army Melia Signed: 09/08/2019 1:33:12 PM By: Worthy Keeler PA-C Entered By: Army Melia on 09/08/2019 10:00:00 Kelly Dougherty ([id number]) -------------------------------------------------------------------------------- SuperBill Details Patient Name: Kelly Dougherty Date of Service: 09/08/2019 Medical Record Number: [id number] Patient Account Number: [id number] Date of Birth/Sex: June 17, 1941 (78 y.o. F) Treating RN: Montey Hora Primary Care Provider: Vic Blackbird Other Clinician: Referring Provider: Kate Sable Treating Provider/Extender: Melburn Hake, HOYT Weeks in Treatment: 0 Diagnosis Coding ICD-10 Codes Code Description I87.2 Venous insufficiency (chronic) (peripheral) L97.812 Non-pressure chronic ulcer of other part of right lower leg with fat layer exposed I48.0 Paroxysmal atrial fibrillation Z79.01 Long term (current) use of anticoagulants Facility Procedures CPT4 Code Description: [id number] - WOUND CARE VISIT-LEV 3 EST PT Modifier: Quantity: 1 CPT4 Code Description: [id number] biopsy of skin (including simple closure, when performed) single lesion ICD-10 Diagnosis Description I87.2 Venous insufficiency (chronic) (peripheral) L97.812 Non-pressure chronic ulcer of other part of right  lower leg with fat Modifier: layer expos Quantity: 1 ed Physician Procedures CPT4 Code Description: [id number] - WC PHYS LEVEL 4 - NEW PT ICD-10 Diagnosis Description I87.2 Venous insufficiency (chronic) (peripheral) L97.812 Non-pressure chronic ulcer of other part of right lower leg with fat I48.0 Paroxysmal atrial  fibrillation Z79.01 Long term (current) use of anticoagulants Modifier: 25 layer expo Quantity: 1 sed CPT4 Code Description: 11104 Punch biopsy of skin (including simple closure, when performed) single lesion ICD-10 Diagnosis Description I87.2 Venous insufficiency (chronic) (peripheral) L97.812 Non-pressure chronic ulcer of other part of right lower leg  with fat Modifier: layer expo Quantity: 1 sed Electronic Signature(s) Signed:  09/08/2019 1:32:42 PM By: Worthy Keeler PA-C Entered By: Worthy Keeler on 09/08/2019 13:32:41

## 2019-09-11 ENCOUNTER — Other Ambulatory Visit: Payer: Self-pay

## 2019-09-11 ENCOUNTER — Encounter (HOSPITAL_COMMUNITY): Payer: Self-pay | Admitting: Nurse Practitioner

## 2019-09-11 ENCOUNTER — Ambulatory Visit (HOSPITAL_COMMUNITY)
Admission: RE | Admit: 2019-09-11 | Discharge: 2019-09-11 | Disposition: A | Discharge status: Home / Self Care | Payer: Medicare Other | Source: Ambulatory Visit | Attending: Nurse Practitioner | Admitting: Nurse Practitioner

## 2019-09-11 VITALS — BP 122/82 | HR 130 | Ht 65.0 in | Wt 112.4 lb

## 2019-09-11 DIAGNOSIS — I4891 Unspecified atrial fibrillation: Secondary | ICD-10-CM | POA: Diagnosis not present

## 2019-09-11 DIAGNOSIS — H409 Unspecified glaucoma: Secondary | ICD-10-CM | POA: Diagnosis not present

## 2019-09-11 DIAGNOSIS — Z79899 Other long term (current) drug therapy: Secondary | ICD-10-CM | POA: Insufficient documentation

## 2019-09-11 DIAGNOSIS — Z7901 Long term (current) use of anticoagulants: Secondary | ICD-10-CM | POA: Diagnosis not present

## 2019-09-11 DIAGNOSIS — I483 Typical atrial flutter: Secondary | ICD-10-CM | POA: Diagnosis present

## 2019-09-11 MED ORDER — METOPROLOL TARTRATE 25 MG PO TABS: ORAL_TABLET | ORAL | 3 refills | Status: DC

## 2019-09-11 NOTE — Patient Instructions (Signed)
Increase metoprolol to 25mg  in the AM and 12.5mg  in the PM  Scheduling will call regarding appt with Dr. Lovena Le

## 2019-09-11 NOTE — Progress Notes (Signed)
Primary Care Physician: Alycia Rossetti, MD Referring Physician: Dr. Marlowe Alt Kelly Dougherty is a 78 y.o. female with a h/o atrial flutter in afib clinic upon referral from Dr. Bronson Ing. Pt was diagnosed with afib in 2016 at which time she received a cardioversion and started on Multaq. She would still have breakthrough racing heart at times and this became persistent  in early September. She feels more fatigued and winded with activities  When out of normal rhythm. Multaq has been stopped as it was not controlling arrhythmia.  Today, she denies symptoms of palpitations, chest pain, shortness of breath, orthopnea, PND, lower extremity edema, dizziness, presyncope, syncope, or neurologic sequela. The patient is tolerating medications without difficulties and is otherwise without complaint today.   Past Medical History:  Diagnosis Date  . Arthritis   . Atrial fibrillation ([place])   . Cataract   . Dysrhythmia    04/2015 with atrial fibrillation  . Glaucoma   . History of echocardiogram    Echo 1/19: EF 60-65, no RWMA, normal diastolic function, mild to mod AI, MAC, mild to mod MR, mild BAE, mod TR, PASP 40   Past Surgical History:  Procedure Laterality Date  . APPENDECTOMY     78 years of age  . CARDIOVERSION N/A 10/03/2015   Procedure: CARDIOVERSION;  Surgeon: Herminio Commons, MD;  Location: [facility];  Service: Cardiovascular;  Laterality: N/A;  . COLONOSCOPY N/A 07/30/2016   Procedure: COLONOSCOPY;  Surgeon: Rogene Houston, MD;  Location: [facility];  Service: Endoscopy;  Laterality: N/A;  8:30  . EYE SURGERY     left  . HEMORROIDECTOMY    . TONSILLECTOMY     childhood    Current Outpatient Medications  Medication Sig Dispense Refill  . apixaban (ELIQUIS) 5 MG TABS tablet Take 1 tablet (5 mg total) by mouth 2 (two) times daily. 180 tablet 3  . calcium-vitamin D (OSCAL WITH D) 500-200 MG-UNIT tablet Take 1 tablet by mouth.    [name] Calcium (STOOL SOFTENER PO)  Take by mouth.    . furosemide (LASIX) 20 MG tablet TAKE 1 TABLET BY MOUTH DAILY AS NEEDED FOR SWELLING 90 tablet 3  . latanoprost (XALATAN) 0.005 % ophthalmic solution Place 1 drop into both eyes at bedtime.     . metoprolol tartrate (LOPRESSOR) 25 MG tablet Take 1 tablet in the AM and 1/2 tablet in the PM 180 tablet 3  . potassium chloride (K-DUR,KLOR-CON) 10 MEQ tablet TAKE 1 TABLET BY MOUTH ONLY ON DAYS YOUR TAKING LASIX FOR SWELLING 90 tablet 3  . metoprolol tartrate (LOPRESSOR) 25 MG tablet Take 0.5 tablets (12.5 mg total) by mouth daily as needed. palpitations 30 tablet 3   No current facility-administered medications for this encounter.     No Known Allergies  Social History   Socioeconomic History  . Marital status: Married    Spouse name: Not on file  . Number of children: Not on file  . Years of education: Not on file  . Highest education level: Not on file  Occupational History  . Not on file  Social Needs  . Financial resource strain: Not on file  . Food insecurity    Worry: Not on file    Inability: Not on file  . Transportation needs    Medical: Not on file    Non-medical: Not on file  Tobacco Use  . Smoking status: Never Smoker  . Smokeless tobacco: Never Used  Substance and  Sexual Activity  . Alcohol use: Yes    Alcohol/week: 7.0 standard drinks    Types: 7 Glasses of wine per week    Comment: one glass of wine with dinner each night  . Drug use: No  . Sexual activity: Not Currently  Lifestyle  . Physical activity    Days per week: Not on file    Minutes per session: Not on file  . Stress: Not on file  Relationships  . Social Herbalist on phone: Not on file    Gets together: Not on file    Attends religious service: Not on file    Active member of club or organization: Not on file    Attends meetings of clubs or organizations: Not on file    Relationship status: Not on file  . Intimate partner violence    Fear of current or ex partner:  Not on file    Emotionally abused: Not on file    Physically abused: Not on file    Forced sexual activity: Not on file  Other Topics Concern  . Not on file  Social History Narrative  . Not on file    Family History  Problem Relation Age of Onset  . Hearing loss Mother   . Alzheimer's disease Father     ROS- All systems are reviewed and negative except as per the HPI above  Physical Exam: Vitals:   09/11/19 0929  BP: 122/82  Pulse: (!) 130  Weight: 51 kg  Height: _0  (1.651 m)   Wt Readings from Last 3 Encounters:  09/11/19 51 kg  08/30/19 50.3 kg  01/09/19 51.1 kg    Labs: Lab Results  Component Value Date   NA 139 01/09/2019   K 4.3 01/09/2019   CL 102 01/09/2019   CO2 27 01/09/2019   GLUCOSE 62 (L) 01/09/2019   BUN 12 01/09/2019   CREATININE 0.85 01/09/2019   CALCIUM 9.2 01/09/2019   MG 2.4 05/01/2015   No results found for: INR Lab Results  Component Value Date   CHOL 188 01/09/2019   HDL 68 01/09/2019   LDLCALC 105 (H) 01/09/2019   TRIG 65 01/09/2019     GEN- The patient is well appearing, alert and oriented x 3 today.   Head- normocephalic, atraumatic Eyes-  Sclera clear, conjunctiva pink Ears- hearing intact Oropharynx- clear Neck- supple, no JVP Lymph- no cervical lymphadenopathy Lungs- Clear to ausculation bilaterally, normal work of breathing Heart- fast irregular rate and rhythm, no murmurs, rubs or gallops, PMI not laterally displaced GI- soft, NT, ND, + BS Extremities- no clubbing, cyanosis, or edema MS- no significant deformity or atrophy Skin- no rash or lesion Psych- euthymic mood, full affect Neuro- strength and sensation are intact  EKG-atrial flutter with variable AV block, 130 bpm, appears typical, qrs int 76 ms, qtc 311 ms EKG reviewed form 9/2 also appears to be typical aflutter  EKG from  10/5 also appers to be typical atrial flutter Echo- IMPRESSIONS    1. The left ventricle has normal systolic function, with an  ejection fraction of 55-60%. The cavity size was normal. Left ventricular diastolic Doppler parameters are indeterminate.  2. The right ventricle has normal systolic function. The cavity was normal. There is no increase in right ventricular wall thickness.  3. Left atrial size was severely dilated.  4. Right atrial size was severely dilated.  5. The mitral valve is abnormal. Mild thickening of the mitral valve leaflet. Mild calcification  of the mitral valve leaflet. There is mild mitral annular calcification present.  6. The tricuspid valve is abnormal. 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  7. The aortic valve is tricuspid. Aortic valve regurgitation is mild by color flow Doppler. No stenosis of the aortic valve.  8. The aorta is normal unless otherwise noted.  9. The aortic root is normal in size and structure. 10. Pulmonary hypertension is mildly elevated, PASP is 35 mmHg. 11. The inferior vena cava was dilated in size with >50% respiratory variability.  Assessment and Plan: 1. Typical atrial flutter  I discussed cardioversion, vrs state of another antiarrythmic vrs a typical atrial flutter  ablation On review of her EKG's I see mostly typical flutter She would like to discuss ablation further with Dr. Lovena Le She can see in him in [facility], will request next available  Increase Metoprolol to 25 mg am and continue 12.5 mg pm  2. CHA2DS2VASc score of at least 3 States no missed doses of eliquis 5 mg bid   Geroge Baseman. Bali Lyn, [facility] [address] [place], [place] [address] [phone]

## 2019-09-15 ENCOUNTER — Encounter: Payer: Medicare Other | Admitting: Physician Assistant

## 2019-09-15 ENCOUNTER — Other Ambulatory Visit: Payer: Self-pay

## 2019-09-15 DIAGNOSIS — L97812 Non-pressure chronic ulcer of other part of right lower leg with fat layer exposed: Secondary | ICD-10-CM | POA: Diagnosis not present

## 2019-09-15 NOTE — Progress Notes (Addendum)
TERY, Kelly Dougherty ([id number]) Visit Report for 09/15/2019 Chief Complaint Document Details Patient Name: Kelly Dougherty, Kelly Dougherty Date of Service: 09/15/2019 11:00 AM Medical Record Number: [id number] Patient Account Number: [id number] Date of Birth/Sex: [birth year] (78 y.o. F) Treating RN: Montey Hora Primary Care Provider: Vic Blackbird Other Clinician: Referring Provider: Vic Blackbird Treating Provider/Extender: Melburn Hake, Jolly Bleicher Weeks in Treatment: 1 Information Obtained from: Patient Chief Complaint Right LE Ulcer Electronic Signature(s) Signed: 09/15/2019 11:00:31 AM By: Worthy Keeler PA-C Entered By: Worthy Keeler on 09/15/2019 11:00:31 Kelly Dougherty ([id number]) -------------------------------------------------------------------------------- HPI Details Patient Name: Kelly Dougherty Date of Service: 09/15/2019 11:00 AM Medical Record Number: [id number] Patient Account Number: [id number] Date of Birth/Sex: 04/19/1941 (78 y.o. F) Treating RN: Montey Hora Primary Care Provider: Vic Blackbird Other Clinician: Referring Provider: Vic Blackbird Treating Provider/Extender: Melburn Hake, Lakin Romer Weeks in Treatment: 1 History of Present Illness HPI Description: 09/08/2019 patient presents today for initial evaluation in our clinic concerning issues that she has been having with her lower extremity anteriorly. She states that she has a wound that she has had a very difficult time trying to get to heal. This began greater than a year ago and she states it is never really closed completely. Fortunately there is no signs of infection but she is obviously very frustrated with the fact that she is having a difficult time getting this area to heal. No fevers, chills, nausea, vomiting, or diarrhea. The patient does have a history of atrial fibrillation for which she is on Eliquis. She is also had an echocardiogram which showed an ejection fraction of 65%. It was her cardiologist that referred  her to [place]. She does not have any signs of peripheral vascular disease which is good news. With that being said she also does not have diabetes. This wound actually is somewhat suspicious to me for the possibility of being a cancerous lesion especially with a history of not really closing over such a long time despite the fact she really does not appear to be too swollen to me today I am not thinking that this is a traditional venous leg ulcer. 09/15/2019 on evaluation today patient presents for follow-up concerning her lower extremity ulcer. We did receive the biopsy report back this morning fortunately. 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 What this means in the immediate is that obviously what I removed did not include all of what is present as far as the skin cancer is concerned. Subsequently I think she is getting need to be referred to the skin surgery Center in [place] in order to have a Mohs surgery in order to remove all of this. Unfortunately she also has an upcoming ablation with her cardiologist as well as an area that she has to have removed from the side her right eye by her ophthalmologist. She has a lot going on at this time unfortunately. Electronic Signature(s) Signed: 09/15/2019 2:42:32 PM By: Worthy Keeler PA-C Entered By: Worthy Keeler on 09/15/2019 14:42:32 Kelly Dougherty, Kelly Dougherty ([id number]) -------------------------------------------------------------------------------- Physical Exam Details Patient Name: Kelly Dougherty Date of Service: 09/15/2019 11:00 AM Medical Record Number: [id number] Patient Account Number: [id number] Date of Birth/Sex: 1941-12-23 (78 y.o. F) Treating RN: Montey Hora Primary Care Provider: Vic Blackbird Other Clinician: Referring Provider: Vic Blackbird Treating Provider/Extender: STONE III, Shahrukh Pasch Weeks in Treatment: 1 Constitutional Well-nourished and  well-hydrated in no acute distress. Respiratory normal breathing without difficulty. clear to auscultation bilaterally.  Cardiovascular regular rate and rhythm with normal S1, S2. Psychiatric this patient is able to make decisions and demonstrates good insight into disease process. Alert and Oriented x 3. pleasant and cooperative. Notes Patient's wound bed actually showed signs of good granulation in some portion of the wound. Subsequently she also has some necrotic tissue in the central portion of the wound where I had to use silver nitrate to stop the bleeding after the biopsy last week. With that being said we will let this slowly work its way out I am not going to do anything more aggressive at this time with regard to the necrotic tissue especially in light of the fact that this is a cancerous lesion. Electronic Signature(s) Signed: 09/15/2019 2:43:07 PM By: Worthy Keeler PA-C Entered By: Worthy Keeler on 09/15/2019 14:43:07 Kelly Dougherty ([id number]) -------------------------------------------------------------------------------- Physician Orders Details Patient Name: Kelly Dougherty Date of Service: 09/15/2019 11:00 AM Medical Record Number: [id number] Patient Account Number: [id number] Date of Birth/Sex: 1941/07/28 (78 y.o. F) Treating RN: Montey Hora Primary Care Provider: Vic Blackbird Other Clinician: Referring Provider: Vic Blackbird Treating Provider/Extender: Melburn Hake, Hammad Finkler Weeks in Treatment: 1 Verbal / Phone Orders: No Diagnosis Coding ICD-10 Coding Code Description I87.2 Venous insufficiency (chronic) (peripheral) L97.812 Non-pressure chronic ulcer of other part of right lower leg with fat layer exposed I48.0 Paroxysmal atrial fibrillation Z79.01 Long term (current) use of anticoagulants Wound Cleansing Wound #1 Right,Anterior Lower Leg o Clean wound with Normal Saline. o Dial antibacterial soap, wash wounds, rinse and pat dry prior to dressing  wounds o May Shower, gently pat wound dry prior to applying new dressing. Anesthetic (add to Medication List) Wound #1 Right,Anterior Lower Leg o Topical Lidocaine 4% cream applied to wound bed prior to debridement (In Clinic Only). Primary Wound Dressing Wound #1 Right,Anterior Lower Leg o Silver Alginate Secondary Dressing Wound #1 Right,Anterior Lower Leg o Boardered Foam Dressing Dressing Change Frequency Wound #1 Right,Anterior Lower Leg o Change dressing every other day. Follow-up Appointments Wound #1 Right,Anterior Lower Leg o Return Appointment in 2 weeks. [facility] [place] Patient Medications Allergies: No Known Allergies Notifications Medication Indication Start End MISCHELE, HAIRGROVE ([id number]) Notifications Medication Indication Start End gentamicin 09/15/2019 DOSE topical 0.1 % cream - cream topical applied topically to the wound bed with each dressing change as directed in the clinic Electronic Signature(s) Signed: 09/15/2019 11:40:57 AM By: Worthy Keeler PA-C Entered By: Worthy Keeler on 09/15/2019 11:40:57 Kelly Dougherty ([id number]) -------------------------------------------------------------------------------- Problem List Details Patient Name: Kelly Dougherty Date of Service: 09/15/2019 11:00 AM Medical Record Number: [id number] Patient Account Number: [id number] Date of Birth/Sex: 05/21/1941 (78 y.o. F) Treating RN: Montey Hora Primary Care Provider: Vic Blackbird Other Clinician: Referring Provider: Vic Blackbird Treating Provider/Extender: Melburn Hake, Ido Wollman Weeks in Treatment: 1 Active Problems ICD-10 Evaluated Encounter Code Description Active Date Today Diagnosis I87.2 Venous insufficiency (chronic) (peripheral) 09/08/2019 No Yes L97.812 Non-pressure chronic ulcer of other part of right lower leg 09/08/2019 No Yes with fat layer exposed I48.0 Paroxysmal atrial fibrillation 09/08/2019  No Yes Z79.01 Long term (current) use of anticoagulants 09/08/2019 No Yes Inactive Problems Resolved Problems Electronic Signature(s) Signed: 09/15/2019 11:00:24 AM By: Worthy Keeler PA-C Entered By: Worthy Keeler on 09/15/2019 11:00:24 Kelly Dougherty ([id number]) -------------------------------------------------------------------------------- Progress Note Details Patient Name: Kelly Dougherty Date of Service: 09/15/2019 11:00 AM Medical Record Number: [id number] Patient Account Number: [id number] Date of Birth/Sex: 07/05/1941 (78 y.o. F) Treating RN: Marjory Lies,  Di Kindle Primary Care Provider: Vic Blackbird Other Clinician: Referring Provider: Vic Blackbird Treating Provider/Extender: Melburn Hake, Derry Arbogast Weeks in Treatment: 1 Subjective Chief Complaint Information obtained from Patient Right LE Ulcer History of Present Illness (HPI) 09/08/2019 patient presents today for initial evaluation in our clinic concerning issues that she has been having with her lower extremity anteriorly. She states that she has a wound that she has had a very difficult time trying to get to heal. This began greater than a year ago and she states it is never really closed completely. Fortunately there is no signs of infection but she is obviously very frustrated with the fact that she is having a difficult time getting this area to heal. No fevers, chills, nausea, vomiting, or diarrhea. The patient does have a history of atrial fibrillation for which she is on Eliquis. She is also had an echocardiogram which showed an ejection fraction of 65%. It was her cardiologist that referred her to [place]. She does not have any signs of peripheral vascular disease which is good news. With that being said she also does not have diabetes. This wound actually is somewhat suspicious to me for the possibility of being a cancerous lesion especially with a history of not really closing over such a long time despite the fact she  really does not appear to be too swollen to me today I am not thinking that this is a traditional venous leg ulcer. 09/15/2019 on evaluation today patient presents for follow-up concerning her lower extremity ulcer. We did receive the biopsy report back this morning fortunately. 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 What this means in the immediate is that obviously what I removed did not include all of what is present as far as the skin cancer is concerned. Subsequently I think she is getting need to be referred to the skin surgery Center in [place] in order to have a Mohs surgery in order to remove all of this. Unfortunately she also has an upcoming ablation with her cardiologist as well as an area that she has to have removed from the side her right eye by her ophthalmologist. She has a lot going on at this time unfortunately. Patient History Information obtained from Patient. Family History No family history of Cancer, Diabetes, Heart Disease, Hereditary Spherocytosis, Hypertension, Kidney Disease, Lung Disease, Seizures, Stroke, Thyroid Problems, Tuberculosis. Social History Never smoker, Marital Status - Married, Alcohol Use - Daily, Drug Use - No History, Caffeine Use - Daily. Medical History Eyes Patient has history of Cataracts - bilateral removal, Glaucoma Ear/Nose/Mouth/Throat Denies history of Chronic sinus problems/congestion, Middle ear problems Hematologic/Lymphatic Denies history of Anemia, Hemophilia, Human Immunodeficiency Virus, Lymphedema Respiratory Denies history of Aspiration, Asthma, Chronic Obstructive Pulmonary Disease (COPD), Pneumothorax, Sleep Apnea, Tuberculosis Cardiovascular [place], [place]. ([id number]) Patient has history of Arrhythmia - A Fib Denies history of Angina, Congestive Heart Failure, Coronary Artery Disease, Deep Vein Thrombosis, Hypertension, Hypotension,  Myocardial Infarction, Peripheral Arterial Disease, Peripheral Venous Disease, Phlebitis, Vasculitis Gastrointestinal Denies history of Cirrhosis , Colitis, Crohn s, Hepatitis A, Hepatitis B, Hepatitis C Endocrine Denies history of Type I Diabetes, Type II Diabetes Genitourinary Denies history of End Stage Renal Disease Immunological Denies history of Lupus Erythematosus, Raynaud s, Scleroderma Integumentary (Skin) Denies history of History of Burn, History of pressure wounds Musculoskeletal Denies history of Gout, Rheumatoid Arthritis, Osteoarthritis, Osteomyelitis Neurologic Denies history of Dementia, Neuropathy, Quadriplegia, Paraplegia, Seizure Disorder Oncologic Denies history of  Received Chemotherapy, Received Radiation Psychiatric Denies history of Anorexia/bulimia, Confinement Anxiety Review of Systems (ROS) Constitutional Symptoms (General Health) Denies complaints or symptoms of Fatigue, Fever, Chills, Marked Weight Change. Respiratory Denies complaints or symptoms of Chronic or frequent coughs, Shortness of Breath. Cardiovascular Denies complaints or symptoms of Chest pain, LE edema. Psychiatric Denies complaints or symptoms of Anxiety, Claustrophobia. Objective Constitutional Well-nourished and well-hydrated in no acute distress. Vitals Time Taken: 10:49 AM, Height: 65 in, Weight: 110 lbs, BMI: 18.3, Temperature: 98.5 F, Pulse: 104 bpm, Respiratory Rate: 16 breaths/min, Blood Pressure: 126/76 mmHg. Respiratory normal breathing without difficulty. clear to auscultation bilaterally. Cardiovascular regular rate and rhythm with normal S1, S2. Psychiatric this patient is able to make decisions and demonstrates good insight into disease process. Alert and Oriented x 3. pleasant and cooperative. Kelly Dougherty, Kelly Dougherty ([id number]) General Notes: Patient's wound bed actually showed signs of good granulation in some portion of the wound. Subsequently she also has some  necrotic tissue in the central portion of the wound where I had to use silver nitrate to stop the bleeding after the biopsy last week. With that being said we will let this slowly work its way out I am not going to do anything more aggressive at this time with regard to the necrotic tissue especially in light of the fact that this is a cancerous lesion. Integumentary (Hair, Skin) Wound #1 status is Open. Original cause of wound was Trauma. The wound is located on the Right,Anterior Lower Leg. The wound measures 0.9cm length x 0.6cm width x 0.1cm depth; 0.424cm^2 area and 0.042cm^3 volume. There is Fat Layer (Subcutaneous Tissue) Exposed exposed. There is no tunneling or undermining noted. There is a medium amount of serosanguineous drainage noted. The wound margin is flat and intact. There is medium (34-66%) red granulation within the wound bed. There is a medium (34-66%) amount of necrotic tissue within the wound bed including Eschar and Adherent Slough. Assessment Active Problems ICD-10 Venous insufficiency (chronic) (peripheral) Non-pressure chronic ulcer of other part of right lower leg with fat layer exposed Paroxysmal atrial fibrillation Long term (current) use of anticoagulants Plan Wound Cleansing: Wound #1 Right,Anterior Lower Leg: Clean wound with Normal Saline. Dial antibacterial soap, wash wounds, rinse and pat dry prior to dressing wounds May Shower, gently pat wound dry prior to applying new dressing. Anesthetic (add to Medication List): Wound #1 Right,Anterior Lower Leg: Topical Lidocaine 4% cream applied to wound bed prior to debridement (In Clinic Only). Primary Wound Dressing: Wound #1 Right,Anterior Lower Leg: Silver Alginate Secondary Dressing: Wound #1 Right,Anterior Lower Leg: Boardered Foam Dressing Dressing Change Frequency: Wound #1 Right,Anterior Lower Leg: Change dressing every other day. Follow-up Appointments: Wound #1 Right,Anterior Lower Leg: Return  Appointment in 2 weeks. Consults ordered were: Dermatology - [place] in [place], [place] ([id number]) The following medication(s) was prescribed: gentamicin topical 0.1 % cream cream topical applied topically to the wound bed with each dressing change as directed in the clinic starting 09/15/2019 1. Again the patient does have confirmed basal cell carcinoma nodular type in regard to her right anterior lower extremity ulcer. I am good refer her to the skin surgery Center in [facility] for Mohs surgery. 2. I am in a recommend at this time that we continue with the silver alginate dressing she is going to use topical gentamicin and then apply the silver alginate. 3. I am also going to suggest currently that we go ahead and continue to monitor her on a semi-regular basis until  she gets in with a skin surgery center when she does get in with them then obviously we will no longer see her ongoing. We will see patient back for reevaluation in 2 weeks here in the clinic. If anything worsens or changes patient will contact our office for additional recommendations. Electronic Signature(s) Signed: 09/15/2019 2:44:09 PM By: Worthy Keeler PA-C Entered By: Worthy Keeler on 09/15/2019 14:44:09 Kelly Dougherty, Kelly Dougherty ([id number]) -------------------------------------------------------------------------------- ROS/PFSH Details Patient Name: Kelly Dougherty Date of Service: 09/15/2019 11:00 AM Medical Record Number: [id number] Patient Account Number: [id number] Date of Birth/Sex: 1941-02-16 (78 y.o. F) Treating RN: Montey Hora Primary Care Provider: Vic Blackbird Other Clinician: Referring Provider: Vic Blackbird Treating Provider/Extender: Melburn Hake, Tarek Cravens Weeks in Treatment: 1 Information Obtained From Patient Constitutional Symptoms (General Health) Complaints and Symptoms: Negative for: Fatigue; Fever; Chills; Marked Weight Change Respiratory Complaints and  Symptoms: Negative for: Chronic or frequent coughs; Shortness of Breath Medical History: Negative for: Aspiration; Asthma; Chronic Obstructive Pulmonary Disease (COPD); Pneumothorax; Sleep Apnea; Tuberculosis Cardiovascular Complaints and Symptoms: Negative for: Chest pain; LE edema Medical History: Positive for: Arrhythmia - A Fib Negative for: Angina; Congestive Heart Failure; Coronary Artery Disease; Deep Vein Thrombosis; Hypertension; Hypotension; Myocardial Infarction; Peripheral Arterial Disease; Peripheral Venous Disease; Phlebitis; Vasculitis Psychiatric Complaints and Symptoms: Negative for: Anxiety; Claustrophobia Medical History: Negative for: Anorexia/bulimia; Confinement Anxiety Eyes Medical History: Positive for: Cataracts - bilateral removal; Glaucoma Ear/Nose/Mouth/Throat Medical History: Negative for: Chronic sinus problems/congestion; Middle ear problems Hematologic/Lymphatic Medical History: Negative for: Anemia; Hemophilia; Human Immunodeficiency Virus; Lymphedema Callins, Aliena S. ([id number]) Gastrointestinal Medical History: Negative for: Cirrhosis ; Colitis; Crohnos; Hepatitis A; Hepatitis B; Hepatitis C Endocrine Medical History: Negative for: Type I Diabetes; Type II Diabetes Genitourinary Medical History: Negative for: End Stage Renal Disease Immunological Medical History: Negative for: Lupus Erythematosus; Raynaudos; Scleroderma Integumentary (Skin) Medical History: Negative for: History of Burn; History of pressure wounds Musculoskeletal Medical History: Negative for: Gout; Rheumatoid Arthritis; Osteoarthritis; Osteomyelitis Neurologic Medical History: Negative for: Dementia; Neuropathy; Quadriplegia; Paraplegia; Seizure Disorder Oncologic Medical History: Negative for: Received Chemotherapy; Received Radiation HBO Extended History Items Eyes: Eyes: Cataracts Glaucoma Immunizations Pneumococcal Vaccine: Received Pneumococcal  Vaccination: Yes Implantable Devices None Family and Social History Cancer: No; Diabetes: No; Heart Disease: No; Hereditary Spherocytosis: No; Hypertension: No; Kidney Disease: No; Lung Disease: No; Seizures: No; Stroke: No; Thyroid Problems: No; Tuberculosis: No; Never smoker; Marital Status - Married; Alcohol Use: Daily; Drug Use: No History; Caffeine Use: Daily; Financial Concerns: No; Food, Clothing or Shelter Needs: No; Support System Lacking: No; Transportation Concerns: No Physician Affirmation I have reviewed and agree with the above information. Kelly Dougherty, Kelly Dougherty ([id number]) Electronic Signature(s) Signed: 09/15/2019 4:27:31 PM By: Montey Hora Signed: 09/15/2019 4:44:05 PM By: Worthy Keeler PA-C Entered By: Worthy Keeler on 09/15/2019 14:42:52 Kelly Dougherty, Kelly Dougherty ([id number]) -------------------------------------------------------------------------------- SuperBill Details Patient Name: Kelly Dougherty Date of Service: 09/15/2019 Medical Record Number: [id number] Patient Account Number: [id number] Date of Birth/Sex: 1941-12-10 (78 y.o. F) Treating RN: Montey Hora Primary Care Provider: Vic Blackbird Other Clinician: Referring Provider: Vic Blackbird Treating Provider/Extender: Melburn Hake, Yeng Frankie Weeks in Treatment: 1 Diagnosis Coding ICD-10 Codes Code Description I87.2 Venous insufficiency (chronic) (peripheral) L97.812 Non-pressure chronic ulcer of other part of right lower leg with fat layer exposed I48.0 Paroxysmal atrial fibrillation Z79.01 Long term (current) use of anticoagulants Facility Procedures CPT4 Code: [id number] Description: 99213 - WOUND CARE VISIT-LEV 3 EST PT Modifier: Quantity: 1 Physician Procedures CPT4 Code Description: [id number] - WC PHYS LEVEL 4 -  EST PT ICD-10 Diagnosis Description I87.2 Venous insufficiency (chronic) (peripheral) L97.812 Non-pressure chronic ulcer of other part of right lower leg w I48.0 Paroxysmal atrial fibrillation   Z79.01 Long term (current) use of anticoagulants Modifier: ith fat layer expo Quantity: 1 sed Electronic Signature(s) Signed: 09/15/2019 2:44:24 PM By: Worthy Keeler PA-C Entered By: Worthy Keeler on 09/15/2019 14:44:24

## 2019-09-19 NOTE — Progress Notes (Addendum)
Kelly Dougherty, Kelly Dougherty ([id number]) Visit Report for 09/15/2019 Arrival Information Details Patient Name: Kelly Dougherty, Kelly Dougherty Date of Service: 09/15/2019 11:00 AM Medical Record Number: [id number] Patient Account Number: [id number] Date of Birth/Sex: 01/30/1941 (78 y.o. F) Treating RN: Montey Hora Primary Care Reagan Klemz: Vic Blackbird Other Clinician: Referring Tameko Halder: Vic Blackbird Treating Glora Hulgan/Extender: Melburn Hake, HOYT Weeks in Treatment: 1 Visit Information History Since Last Visit Added or deleted any medications: No Patient Arrived: Ambulatory Any new allergies or adverse reactions: No Arrival Time: 10:47 Had a fall or experienced change in No Accompanied By: self activities of daily living that may affect Transfer Assistance: None risk of falls: Patient Identification Verified: Yes Signs or symptoms of abuse/neglect since last visito No Secondary Verification Process Yes Hospitalized since last visit: No Completed: Implantable device outside of the clinic excluding No Patient Has Alerts: Yes cellular tissue based products placed in the center Patient Alerts: Patient on Blood since last visit: Thinner Has Dressing in Place as Prescribed: Yes Pain Present Now: No Electronic Signature(s) Signed: 09/19/2019 1:07:49 PM By: Lorine Bears RCP, RRT, CHT Entered By: Lorine Bears on 09/15/2019 10:48:57 Kelly Dougherty ([id number]) -------------------------------------------------------------------------------- Clinic Level of Care Assessment Details Patient Name: Kelly Dougherty Date of Service: 09/15/2019 11:00 AM Medical Record Number: [id number] Patient Account Number: [id number] Date of Birth/Sex: [birth year] (78 y.o. F) Treating RN: Montey Hora Primary Care Ruari Mudgett: Vic Blackbird Other Clinician: Referring Edwardo Wojnarowski: Vic Blackbird Treating Marilouise Densmore/Extender: Melburn Hake, HOYT Weeks in Treatment: 1 Clinic Level of Care Assessment  Items TOOL 4 Quantity Score []  - Use when only an EandM is performed on FOLLOW-UP visit 0 ASSESSMENTS - Nursing Assessment / Reassessment X - Reassessment of Co-morbidities (includes updates in patient status) 1 10 X- 1 5 Reassessment of Adherence to Treatment Plan ASSESSMENTS - Wound and Skin Assessment / Reassessment X - Simple Wound Assessment / Reassessment - one wound 1 5 []  - 0 Complex Wound Assessment / Reassessment - multiple wounds []  - 0 Dermatologic / Skin Assessment (not related to wound area) ASSESSMENTS - Focused Assessment []  - Circumferential Edema Measurements - multi extremities 0 []  - 0 Nutritional Assessment / Counseling / Intervention X- 1 5 Lower Extremity Assessment (monofilament, tuning fork, pulses) []  - 0 Peripheral Arterial Disease Assessment (using hand held doppler) ASSESSMENTS - Ostomy and/or Continence Assessment and Care []  - Incontinence Assessment and Management 0 []  - 0 Ostomy Care Assessment and Management (repouching, etc.) PROCESS - Coordination of Care X - Simple Patient / Family Education for ongoing care 1 15 []  - 0 Complex (extensive) Patient / Family Education for ongoing care X- 1 10 Staff obtains Programmer, systems, Records, Test Results / Process Orders []  - 0 Staff telephones HHA, Nursing Homes / Clarify orders / etc []  - 0 Routine Transfer to another Facility (non-emergent condition) []  - 0 Routine Hospital Admission (non-emergent condition) []  - 0 New Admissions / Biomedical engineer / Ordering NPWT, Apligraf, etc. []  - 0 Emergency Hospital Admission (emergent condition) X- 1 10 Simple Discharge Coordination Kelly Dougherty, REIS. ([id number]) []  - 0 Complex (extensive) Discharge Coordination PROCESS - Special Needs []  - Pediatric / Minor Patient Management 0 []  - 0 Isolation Patient Management []  - 0 Hearing / Language / Visual special needs []  - 0 Assessment of Community assistance (transportation, D/C planning, etc.) []  -  0 Additional assistance / Altered mentation []  - 0 Support Surface(s) Assessment (bed, cushion, seat, etc.) INTERVENTIONS - Wound Cleansing / Measurement X - Simple Wound Cleansing - one wound 1  5 []  - 0 Complex Wound Cleansing - multiple wounds X- 1 5 Wound Imaging (photographs - any number of wounds) []  - 0 Wound Tracing (instead of photographs) X- 1 5 Simple Wound Measurement - one wound []  - 0 Complex Wound Measurement - multiple wounds INTERVENTIONS - Wound Dressings X - Small Wound Dressing one or multiple wounds 1 10 []  - 0 Medium Wound Dressing one or multiple wounds []  - 0 Large Wound Dressing one or multiple wounds X- 1 5 Application of Medications - topical []  - 0 Application of Medications - injection INTERVENTIONS - Miscellaneous []  - External ear exam 0 []  - 0 Specimen Collection (cultures, biopsies, blood, body fluids, etc.) []  - 0 Specimen(s) / Culture(s) sent or taken to Lab for analysis []  - 0 Patient Transfer (multiple staff / Civil Service fast streamer / Similar devices) []  - 0 Simple Staple / Suture removal (25 or less) []  - 0 Complex Staple / Suture removal (26 or more) []  - 0 Hypo / Hyperglycemic Management (close monitor of Blood Glucose) []  - 0 Ankle / Brachial Index (ABI) - do not check if billed separately X- 1 5 Vital Signs Kelly Dougherty, Kelly S. ([id number]) Has the patient been seen at the hospital within the last three years: Yes Total Score: 95 Level Of Care: New/Established - Level 3 Electronic Signature(s) Signed: 09/15/2019 4:27:31 PM By: Montey Hora Entered By: Montey Hora on 09/15/2019 11:33:28 Kelly Dougherty ([id number]) -------------------------------------------------------------------------------- Encounter Discharge Information Details Patient Name: Kelly Dougherty Date of Service: 09/15/2019 11:00 AM Medical Record Number: [id number] Patient Account Number: [id number] Date of Birth/Sex: 10-21-1941 (78 y.o. F) Treating RN: Montey Hora Primary Care Janeal Abadi: Vic Blackbird Other Clinician: Referring Michael Walrath: Vic Blackbird Treating Anitta Tenny/Extender: Melburn Hake, HOYT Weeks in Treatment: 1 Encounter Discharge Information Items Discharge Condition: Stable Ambulatory Status: Ambulatory Discharge Destination: Home Transportation: Private Auto Accompanied By: self Schedule Follow-up Appointment: Yes Clinical Summary of Care: Electronic Signature(s) Signed: 09/15/2019 4:27:31 PM By: Montey Hora Entered By: Montey Hora on 09/15/2019 11:34:49 Kelly Dougherty ([id number]) -------------------------------------------------------------------------------- Lower Extremity Assessment Details Patient Name: Kelly Dougherty Date of Service: 09/15/2019 11:00 AM Medical Record Number: [id number] Patient Account Number: [id number] Date of Birth/Sex: 05-Oct-1941 (78 y.o. F) Treating RN: Harold Barban Primary Care Berkeley Vanaken: Vic Blackbird Other Clinician: Referring Mckinsley Koelzer: Vic Blackbird Treating Kennice Finnie/Extender: Melburn Hake, HOYT Weeks in Treatment: 1 Edema Assessment Assessed: [Left: No] [Right: No] [Left: Edema] [Right: :] Calf Left: Right: Point of Measurement: 30 cm From Medial Instep cm 30.2 cm Ankle Left: Right: Point of Measurement: 10 cm From Medial Instep cm 20 cm Vascular Assessment Pulses: Dorsalis Pedis Palpable: [Right:Yes] Posterior Tibial Palpable: [Right:Yes] Electronic Signature(s) Signed: 09/15/2019 3:42:00 PM By: Harold Barban Entered By: Harold Barban on 09/15/2019 10:56:37 Kelly Dougherty ([id number]) -------------------------------------------------------------------------------- Multi Wound Chart Details Patient Name: Kelly Dougherty Date of Service: 09/15/2019 11:00 AM Medical Record Number: [id number] Patient Account Number: [id number] Date of Birth/Sex: 03/12/1941 (78 y.o. F) Treating RN: Montey Hora Primary Care Shamarion Coots: Vic Blackbird Other Clinician: Referring  Nyiah Pianka: Vic Blackbird Treating Arshad Oberholzer/Extender: Melburn Hake, HOYT Weeks in Treatment: 1 Vital Signs Height(in): 65 Pulse(bpm): 104 Weight(lbs): 110 Blood Pressure(mmHg): 126/76 Body Mass Index(BMI): 18 Temperature(F): 98.5 Respiratory Rate 16 (breaths/min): Photos: [N/A:N/A] Wound Location: Right Lower Leg - Anterior N/A N/A Wounding Event: Trauma N/A N/A Primary Etiology: Venous Leg Ulcer N/A N/A Comorbid History: Cataracts, Glaucoma, N/A N/A Arrhythmia Date Acquired: 07/28/2018 N/A N/A Weeks of Treatment: 1 N/A N/A Wound Status: Open N/A N/A Measurements L x  W x D 0.9x0.6x0.1 N/A N/A (cm) Area (cm) : 0.424 N/A N/A Volume (cm) : 0.042 N/A N/A % Reduction in Area: 0.00% N/A N/A % Reduction in Volume: 0.00% N/A N/A Classification: Partial Thickness N/A N/A Exudate Amount: Medium N/A N/A Exudate Type: Serosanguineous N/A N/A Exudate Color: red, brown N/A N/A Wound Margin: Flat and Intact N/A N/A Granulation Amount: Medium (34-66%) N/A N/A Granulation Quality: Red N/A N/A Necrotic Amount: Medium (34-66%) N/A N/A Necrotic Tissue: Eschar, Adherent Slough N/A N/A Exposed Structures: Fat Layer (Subcutaneous N/A N/A Tissue) Exposed: Yes Fascia: No Tendon: No Muscle: No Kelly Dougherty, Kelly S. ([id number]) Joint: No Bone: No Epithelialization: None N/A N/A Treatment Notes Electronic Signature(s) Signed: 09/15/2019 4:27:31 PM By: Montey Hora Entered By: Montey Hora on 09/15/2019 11:31:27 Kelly Dougherty ([id number]) -------------------------------------------------------------------------------- Multi-Disciplinary Care Plan Details Patient Name: Kelly Dougherty Date of Service: 09/15/2019 11:00 AM Medical Record Number: [id number] Patient Account Number: [id number] Date of Birth/Sex: [birth year] (78 y.o. F) Treating RN: Montey Hora Primary Care Nithila Sumners: Vic Blackbird Other Clinician: Referring Manuelita Moxon: Vic Blackbird Treating Deakon Frix/Extender: Melburn Hake,  HOYT Weeks in Treatment: 1 Active Inactive Electronic Signature(s) Signed: 10/04/2019 9:22:56 AM By: Gretta Cool, BSN, RN, CWS, Kim RN, BSN Signed: 11/15/2019 4:48:32 PM By: Montey Hora Previous Signature: 09/15/2019 4:27:31 PM Version By: Montey Hora Entered By: Gretta Cool BSN, RN, CWS, Kim on 10/04/2019 09:22:55 Kelly Dougherty, Kelly Dougherty ([id number]) -------------------------------------------------------------------------------- Pain Assessment Details Patient Name: Kelly Dougherty Date of Service: 09/15/2019 11:00 AM Medical Record Number: [id number] Patient Account Number: [id number] Date of Birth/Sex: [birth year] (78 y.o. F) Treating RN: Montey Hora Primary Care Leshawn Houseworth: Vic Blackbird Other Clinician: Referring Kira Hartl: Vic Blackbird Treating Koralyn Prestage/Extender: Melburn Hake, HOYT Weeks in Treatment: 1 Active Problems Location of Pain Severity and Description of Pain Patient Has Paino No Site Locations Pain Management and Medication Current Pain Management: Electronic Signature(s) Signed: 09/15/2019 4:27:31 PM By: Montey Hora Signed: 09/19/2019 1:07:49 PM By: Lorine Bears RCP, RRT, CHT Entered By: Lorine Bears on 09/15/2019 10:49:05 Kelly Dougherty ([id number]) -------------------------------------------------------------------------------- Patient/Caregiver Education Details Patient Name: Kelly Dougherty Date of Service: 09/15/2019 11:00 AM Medical Record Number: [id number] Patient Account Number: [id number] Date of Birth/Gender: Sep 23, 1941 (78 y.o. F) Treating RN: Montey Hora Primary Care Physician: Vic Blackbird Other Clinician: Referring Physician: Vic Blackbird Treating Physician/Extender: Sharalyn Ink in Treatment: 1 Education Assessment Education Provided To: Patient Education Topics Provided Wound/Skin Impairment: Handouts: Other: wound care as ordered Methods: Demonstration, Explain/Verbal Responses: State content  correctly Electronic Signature(s) Signed: 09/15/2019 4:27:31 PM By: Montey Hora Entered By: Montey Hora on 09/15/2019 11:33:48 Kelly Dougherty ([id number]) -------------------------------------------------------------------------------- Wound Assessment Details Patient Name: Kelly Dougherty Date of Service: 09/15/2019 11:00 AM Medical Record Number: [id number] Patient Account Number: [id number] Date of Birth/Sex: 07-Apr-1941 (78 y.o. F) Treating RN: Harold Barban Primary Care Jimie Kuwahara: Vic Blackbird Other Clinician: Referring Janne Faulk: Vic Blackbird Treating Karmel Patricelli/Extender: Melburn Hake, HOYT Weeks in Treatment: 1 Wound Status Wound Number: 1 Primary Etiology: Venous Leg Ulcer Wound Location: Right Lower Leg - Anterior Wound Status: Open Wounding Event: Trauma Comorbid History: Cataracts, Glaucoma, Arrhythmia Date Acquired: 07/28/2018 Weeks Of Treatment: 1 Clustered Wound: No Photos Wound Measurements Length: (cm) 0.9 % Reduction Width: (cm) 0.6 % Reduction Depth: (cm) 0.1 Epithelializ Area: (cm) 0.424 Tunneling: Volume: (cm) 0.042 Undermining in Area: 0% in Volume: 0% ation: None No : No Wound Description Classification: Partial Thickness Foul Odor A Wound Margin: Flat and Intact Slough/Fibr Exudate Amount: Medium Exudate Type: Serosanguineous Exudate Color: red, brown fter Cleansing: No  ino Yes Wound Bed Granulation Amount: Medium (34-66%) Exposed Structure Granulation Quality: Red Fascia Exposed: No Necrotic Amount: Medium (34-66%) Fat Layer (Subcutaneous Tissue) Exposed: Yes Necrotic Quality: Eschar, Adherent Slough Tendon Exposed: No Muscle Exposed: No Joint Exposed: No Bone Exposed: No Electronic Signature(s) Kelly Dougherty, Kelly Dougherty ([id number]) Signed: 09/15/2019 3:42:00 PM By: Harold Barban Entered By: Harold Barban on 09/15/2019 10:56:10 Kelly Dougherty  ([id number]) -------------------------------------------------------------------------------- Vitals Details Patient Name: Kelly Dougherty Date of Service: 09/15/2019 11:00 AM Medical Record Number: [id number] Patient Account Number: [id number] Date of Birth/Sex: [birth year] (78 y.o. F) Treating RN: Montey Hora Primary Care Tsion Inghram: Vic Blackbird Other Clinician: Referring Hargis Vandyne: Vic Blackbird Treating Ryah Cribb/Extender: Melburn Hake, HOYT Weeks in Treatment: 1 Vital Signs Time Taken: 10:49 Temperature (F): 98.5 Height (in): 65 Pulse (bpm): 104 Weight (lbs): 110 Respiratory Rate (breaths/min): 16 Body Mass Index (BMI): 18.3 Blood Pressure (mmHg): 126/76 Reference Range: 80 - 120 mg / dl Electronic Signature(s) Signed: 09/19/2019 1:07:49 PM By: Lorine Bears RCP, RRT, CHT Entered By: Lorine Bears on 09/15/2019 10:52:09

## 2019-09-21 ENCOUNTER — Other Ambulatory Visit: Payer: Self-pay

## 2019-09-21 ENCOUNTER — Ambulatory Visit (INDEPENDENT_AMBULATORY_CARE_PROVIDER_SITE_OTHER): Payer: Medicare Other | Admitting: Internal Medicine

## 2019-09-21 ENCOUNTER — Encounter: Payer: Self-pay | Admitting: Internal Medicine

## 2019-09-21 DIAGNOSIS — I4892 Unspecified atrial flutter: Secondary | ICD-10-CM | POA: Diagnosis not present

## 2019-09-21 NOTE — Progress Notes (Signed)
HPI Kelly Dougherty is referred today for additional evaluation and treatment of atrial flutter. She has a remote h/o atrial fib. She has been on multaq in the past. She was found to be in atrial flutter with a RVR/CVR and is referred to consider catheter ablation. She does not have CAD and her EF is normal. The patient has never had syncope. No edema. She feels poorly when she is out of rhythm.  No Known Allergies   Current Outpatient Medications  Medication Sig Dispense Refill  . apixaban (ELIQUIS) 5 MG TABS tablet Take 1 tablet (5 mg total) by mouth 2 (two) times daily. 180 tablet 3  . calcium-vitamin D (OSCAL WITH D) 500-200 MG-UNIT tablet Take 1 tablet by mouth.    Kelly Dougherty Calcium (STOOL SOFTENER PO) Take by mouth.    . furosemide (LASIX) 20 MG tablet TAKE 1 TABLET BY MOUTH DAILY AS NEEDED FOR SWELLING 90 tablet 3  . Latanoprostene Bunod (VYZULTA) 0.024 % SOLN Apply 1 drop to eye at bedtime.    . metoprolol tartrate (LOPRESSOR) 25 MG tablet Take 1 tablet in the AM and 1/2 tablet in the PM 180 tablet 3  . potassium chloride (K-DUR,KLOR-CON) 10 MEQ tablet TAKE 1 TABLET BY MOUTH ONLY ON DAYS YOUR TAKING LASIX FOR SWELLING 90 tablet 3  . timolol (BETIMOL) 0.5 % ophthalmic solution Place 1 drop into the left eye 2 (two) times daily.     No current facility-administered medications for this visit.      Past Medical History:  Diagnosis Date  . Arthritis   . Atrial fibrillation (Kelly Dougherty)   . Cataract   . Dysrhythmia    04/2015 with atrial fibrillation  . Glaucoma   . History of echocardiogram    Echo 1/19: EF 60-65, no RWMA, normal diastolic function, mild to mod AI, MAC, mild to mod MR, mild BAE, mod TR, PASP 40    ROS:   All systems reviewed and negative except as noted in the HPI.   Past Surgical History:  Procedure Laterality Date  . APPENDECTOMY     78 years of age  . CARDIOVERSION N/A 10/03/2015   Procedure: CARDIOVERSION;  Surgeon: Kelly Commons, MD;  Location:  [facility];  Service: Cardiovascular;  Laterality: N/A;  . COLONOSCOPY N/A 07/30/2016   Procedure: COLONOSCOPY;  Surgeon: Kelly Houston, MD;  Location: [facility];  Service: Endoscopy;  Laterality: N/A;  8:30  . EYE SURGERY     left  . HEMORROIDECTOMY    . TONSILLECTOMY     childhood     Family History  Problem Relation Age of Onset  . Hearing loss Mother   . Alzheimer's disease Father      Social History   Socioeconomic History  . Marital status: Married    Spouse name: Not on file  . Number of children: Not on file  . Years of education: Not on file  . Highest education level: Not on file  Occupational History  . Not on file  Social Needs  . Financial resource strain: Not on file  . Food insecurity    Worry: Not on file    Inability: Not on file  . Transportation needs    Medical: Not on file    Non-medical: Not on file  Tobacco Use  . Smoking status: Never Smoker  . Smokeless tobacco: Never Used  Substance and Sexual Activity  . Alcohol use: Yes    Alcohol/week: 7.0 standard drinks  Types: 7 Glasses of wine per week    Comment: one glass of wine with dinner each night  . Drug use: No  . Sexual activity: Not Currently  Lifestyle  . Physical activity    Days per week: Not on file    Minutes per session: Not on file  . Stress: Not on file  Relationships  . Social Herbalist on phone: Not on file    Gets together: Not on file    Attends religious service: Not on file    Active member of club or organization: Not on file    Attends meetings of clubs or organizations: Not on file    Relationship status: Not on file  . Intimate partner violence    Fear of current or ex partner: Not on file    Emotionally abused: Not on file    Physically abused: Not on file    Forced sexual activity: Not on file  Other Topics Concern  . Not on file  Social History Narrative  . Not on file     BP 124/70   Pulse 96   Ht _0  (1.651 m)   Wt 114 lb (51.7  kg)   SpO2 93%   BMI 18.97 kg/m   Physical Exam:  Well appearing NAD HEENT: Unremarkable Neck:  No JVD, no thyromegally Lymphatics:  No adenopathy Back:  No CVA tenderness Lungs:  Clear with no wheezes HEART:  Regular rate rhythm, no murmurs, no rubs, no clicks Abd:  soft, positive bowel sounds, no organomegally, no rebound, no guarding Ext:  2 plus pulses, no edema, no cyanosis, no clubbing Skin:  No rashes no nodules Neuro:  CN II through XII intact, motor grossly intact  EKG - atrial flutter with a controlled VR.  Assess/Plan: 1. Atrial flutter - I discussed the treatment options. The risks/benefits/goals/expectations of the procedure were reviewed and she wishes to proceed. 2. Atrial fib - I would anticipate initiating flecainide or propefenone when she comes into the hospital for her ablation.   Kelly Dougherty.D.

## 2019-09-21 NOTE — H&P (View-Only) (Signed)
    HPI Kelly Dougherty is referred today for additional evaluation and treatment of atrial flutter. She has a remote h/o atrial fib. She has been on multaq in the past. She was found to be in atrial flutter with a RVR/CVR and is referred to consider catheter ablation. She does not have CAD and her EF is normal. The patient has never had syncope. No edema. She feels poorly when she is out of rhythm.  No Known Allergies   Current Outpatient Medications  Medication Sig Dispense Refill  . apixaban (ELIQUIS) 5 MG TABS tablet Take 1 tablet (5 mg total) by mouth 2 (two) times daily. 180 tablet 3  . calcium-vitamin D (OSCAL WITH D) 500-200 MG-UNIT tablet Take 1 tablet by mouth.    . Docusate Calcium (STOOL SOFTENER PO) Take by mouth.    . furosemide (LASIX) 20 MG tablet TAKE 1 TABLET BY MOUTH DAILY AS NEEDED FOR SWELLING 90 tablet 3  . Latanoprostene Bunod (VYZULTA) 0.024 % SOLN Apply 1 drop to eye at bedtime.    . metoprolol tartrate (LOPRESSOR) 25 MG tablet Take 1 tablet in the AM and 1/2 tablet in the PM 180 tablet 3  . potassium chloride (K-DUR,KLOR-CON) 10 MEQ tablet TAKE 1 TABLET BY MOUTH ONLY ON DAYS YOUR TAKING LASIX FOR SWELLING 90 tablet 3  . timolol (BETIMOL) 0.5 % ophthalmic solution Place 1 drop into the left eye 2 (two) times daily.     No current facility-administered medications for this visit.      Past Medical History:  Diagnosis Date  . Arthritis   . Atrial fibrillation ([facility])   . Cataract   . Dysrhythmia    04/2015 with atrial fibrillation  . Glaucoma   . History of echocardiogram    Echo 1/19: EF 60-65, no RWMA, normal diastolic function, mild to mod AI, MAC, mild to mod MR, mild BAE, mod TR, PASP 40    ROS:   All systems reviewed and negative except as noted in the HPI.   Past Surgical History:  Procedure Laterality Date  . APPENDECTOMY     78 years of age  . CARDIOVERSION N/A 10/03/2015   Procedure: CARDIOVERSION;  Surgeon: Suresh A Koneswaran, MD;  Location:  [facility];  Service: Cardiovascular;  Laterality: N/A;  . COLONOSCOPY N/A 07/30/2016   Procedure: COLONOSCOPY;  Surgeon: Najeeb U Rehman, MD;  Location: [facility];  Service: Endoscopy;  Laterality: N/A;  8:30  . EYE SURGERY     left  . HEMORROIDECTOMY    . TONSILLECTOMY     childhood     Family History  Problem Relation Age of Onset  . Hearing loss Mother   . Alzheimer's disease Father      Social History   Socioeconomic History  . Marital status: Married    Spouse name: Not on file  . Number of children: Not on file  . Years of education: Not on file  . Highest education level: Not on file  Occupational History  . Not on file  Social Needs  . Financial resource strain: Not on file  . Food insecurity    Worry: Not on file    Inability: Not on file  . Transportation needs    Medical: Not on file    Non-medical: Not on file  Tobacco Use  . Smoking status: Never Smoker  . Smokeless tobacco: Never Used  Substance and Sexual Activity  . Alcohol use: Yes    Alcohol/week: 7.0 standard drinks      Types: 7 Glasses of wine per week    Comment: one glass of wine with dinner each night  . Drug use: No  . Sexual activity: Not Currently  Lifestyle  . Physical activity    Days per week: Not on file    Minutes per session: Not on file  . Stress: Not on file  Relationships  . Social connections    Talks on phone: Not on file    Gets together: Not on file    Attends religious service: Not on file    Active member of club or organization: Not on file    Attends meetings of clubs or organizations: Not on file    Relationship status: Not on file  . Intimate partner violence    Fear of current or ex partner: Not on file    Emotionally abused: Not on file    Physically abused: Not on file    Forced sexual activity: Not on file  Other Topics Concern  . Not on file  Social History Narrative  . Not on file     BP 124/70   Pulse 96   Ht 5' 5" (1.651 m)   Wt 114 lb (51.7  kg)   SpO2 93%   BMI 18.97 kg/m   Physical Exam:  Well appearing NAD HEENT: Unremarkable Neck:  No JVD, no thyromegally Lymphatics:  No adenopathy Back:  No CVA tenderness Lungs:  Clear with no wheezes HEART:  Regular rate rhythm, no murmurs, no rubs, no clicks Abd:  soft, positive bowel sounds, no organomegally, no rebound, no guarding Ext:  2 plus pulses, no edema, no cyanosis, no clubbing Skin:  No rashes no nodules Neuro:  CN II through XII intact, motor grossly intact  EKG - atrial flutter with a controlled VR.  Assess/Plan: 1. Atrial flutter - I discussed the treatment options. The risks/benefits/goals/expectations of the procedure were reviewed and she wishes to proceed. 2. Atrial fib - I would anticipate initiating flecainide or propefenone when she comes into the hospital for her ablation.   Gregg Taylor,M.D. 

## 2019-09-21 NOTE — Patient Instructions (Addendum)
Medication Instructions:  Your physician recommends that you continue on your current medications as directed. Please refer to the Current Medication list given to you today.  Labwork: You will get lab work today:  BMP and CBC  Testing/Procedures: Your physician has recommended that you have an ablation. Catheter ablation is a medical procedure used to treat some cardiac arrhythmias (irregular heartbeats). During catheter ablation, a long, thin, flexible tube is put into a blood vessel in your groin (upper thigh), or neck. This tube is called an ablation catheter. It is then guided to your heart through the blood vessel. Radio frequency waves destroy small areas of heart tissue where abnormal heartbeats may cause an arrhythmia to start. Please see the instruction sheet given to you today.   Follow-Up: Your physician wants you to follow-up in: 4 weeks with Dr. Lovena Le.      Any Other Special Instructions Will Be Listed Below (If Applicable).  If you need a refill on your cardiac medications before your next appointment, please call your pharmacy.    Cardiac Ablation Cardiac ablation is a procedure to disable (ablate) a small amount of heart tissue in very specific places. The heart has many electrical connections. Sometimes these connections are abnormal and can cause the heart to beat very fast or irregularly. Ablating some of the problem areas can improve the heart rhythm or return it to normal. Ablation may be done for people who:  Have Wolff-Parkinson-White syndrome.  Have fast heart rhythms (tachycardia).  Have taken medicines for an abnormal heart rhythm (arrhythmia) that were not effective or caused side effects.  Have a high-risk heartbeat that may be life-threatening. During the procedure, a small incision is made in the neck or the groin, and a long, thin, flexible tube (catheter) is inserted into the incision and moved to the heart. Small devices (electrodes) on the tip of the  catheter will send out electrical currents. A type of X-ray (fluoroscopy) will be used to help guide the catheter and to provide images of the heart. Tell a health care provider about:  Any allergies you have.  All medicines you are taking, including vitamins, herbs, eye drops, creams, and over-the-counter medicines.  Any problems you or family members have had with anesthetic medicines.  Any blood disorders you have.  Any surgeries you have had.  Any medical conditions you have, such as kidney failure.  Whether you are pregnant or may be pregnant. What are the risks? Generally, this is a safe procedure. However, problems may occur, including:  Infection.  Bruising and bleeding at the catheter insertion site.  Bleeding into the chest, especially into the sac that surrounds the heart. This is a serious complication.  Stroke or blood clots.  Damage to other structures or organs.  Allergic reaction to medicines or dyes.  Need for a permanent pacemaker if the normal electrical system is damaged. A pacemaker is a small computer that sends electrical signals to the heart and helps your heart beat normally.  The procedure not being fully effective. This may not be recognized until months later. Repeat ablation procedures are sometimes required. What happens before the procedure?  Follow instructions from your health care provider about eating or drinking restrictions.  Ask your health care provider about: ? Changing or stopping your regular medicines. This is especially important if you are taking diabetes medicines or blood thinners. ? Taking medicines such as aspirin and ibuprofen. These medicines can thin your blood. Do not take these medicines before your procedure  if your health care provider instructs you not to.  Plan to have someone take you home from the hospital or clinic.  If you will be going home right after the procedure, plan to have someone with you for 24  hours. What happens during the procedure?  To lower your risk of infection: ? Your health care team will wash or sanitize their hands. ? Your skin will be washed with soap. ? Hair may be removed from the incision area.  An IV tube will be inserted into one of your veins.  You will be given a medicine to help you relax (sedative).  The skin on your neck or groin will be numbed.  An incision will be made in your neck or your groin.  A needle will be inserted through the incision and into a large vein in your neck or groin.  A catheter will be inserted into the needle and moved to your heart.  Dye may be injected through the catheter to help your surgeon see the area of the heart that needs treatment.  Electrical currents will be sent from the catheter to ablate heart tissue in desired areas. There are three types of energy that may be used to ablate heart tissue: ? Heat (radiofrequency energy). ? Laser energy. ? Extreme cold (cryoablation).  When the necessary tissue has been ablated, the catheter will be removed.  Pressure will be held on the catheter insertion area to prevent excessive bleeding.  A bandage (dressing) will be placed over the catheter insertion area. The procedure may vary among health care providers and hospitals. What happens after the procedure?  Your blood pressure, heart rate, breathing rate, and blood oxygen level will be monitored until the medicines you were given have worn off.  Your catheter insertion area will be monitored for bleeding. You will need to lie still for a few hours to ensure that you do not bleed from the catheter insertion area.  Do not drive for 24 hours or as long as directed by your health care provider. Summary  Cardiac ablation is a procedure to disable (ablate) a small amount of heart tissue in very specific places. Ablating some of the problem areas can improve the heart rhythm or return it to normal.  During the procedure,  electrical currents will be sent from the catheter to ablate heart tissue in desired areas. This information is not intended to replace advice given to you by your health care provider. Make sure you discuss any questions you have with your health care provider. Document Released: 05/02/2009 Document Revised: 06/06/2018 Document Reviewed: 11/02/2016 Elsevier Patient Education  2020 [employer].

## 2019-09-22 LAB — CBC WITH DIFFERENTIAL/PLATELET
Basophils Absolute: 0 10*3/uL (ref 0.0–0.2)
Basos: 1 %
EOS (ABSOLUTE): 0 10*3/uL (ref 0.0–0.4)
Eos: 1 %
Hematocrit: 42.8 % (ref 34.0–46.6)
Hemoglobin: 14.7 g/dL (ref 11.1–15.9)
Immature Grans (Abs): 0 10*3/uL (ref 0.0–0.1)
Immature Granulocytes: 0 %
Lymphocytes Absolute: 1.5 10*3/uL (ref 0.7–3.1)
Lymphs: 29 %
MCH: 32 pg (ref 26.6–33.0)
MCHC: 34.3 g/dL (ref 31.5–35.7)
MCV: 93 fL (ref 79–97)
Monocytes Absolute: 0.6 10*3/uL (ref 0.1–0.9)
Monocytes: 12 %
Neutrophils Absolute: 2.9 10*3/uL (ref 1.4–7.0)
Neutrophils: 57 %
Platelets: 211 10*3/uL (ref 150–450)
RBC: 4.6 x10E6/uL (ref 3.77–5.28)
RDW: 12.1 % (ref 11.7–15.4)
WBC: 5.1 10*3/uL (ref 3.4–10.8)

## 2019-09-22 LAB — BASIC METABOLIC PANEL
BUN/Creatinine Ratio: 17 (ref 12–28)
BUN: 13 mg/dL (ref 8–27)
CO2: 24 mmol/L (ref 20–29)
Calcium: 9.6 mg/dL (ref 8.7–10.3)
Chloride: 97 mmol/L (ref 96–106)
Creatinine, Ser: 0.76 mg/dL (ref 0.57–1.00)
GFR calc Af Amer: 86 mL/min/{1.73_m2} (ref >59)
GFR calc non Af Amer: 74 mL/min/{1.73_m2} (ref >59)
Glucose: 87 mg/dL (ref 65–99)
Potassium: 4.4 mmol/L (ref 3.5–5.2)
Sodium: 137 mmol/L (ref 134–144)

## 2019-09-29 ENCOUNTER — Other Ambulatory Visit (HOSPITAL_COMMUNITY)
Admission: RE | Admit: 2019-09-29 | Discharge: 2019-09-29 | Disposition: A | Discharge status: Home / Self Care | Payer: Medicare Other | Source: Ambulatory Visit | Attending: Internal Medicine | Admitting: Internal Medicine

## 2019-09-29 ENCOUNTER — Ambulatory Visit: Payer: Medicare Other | Admitting: Physician Assistant

## 2019-09-29 DIAGNOSIS — Z01812 Encounter for preprocedural laboratory examination: Secondary | ICD-10-CM | POA: Diagnosis present

## 2019-09-29 DIAGNOSIS — Z20828 Contact with and (suspected) exposure to other viral communicable diseases: Secondary | ICD-10-CM | POA: Insufficient documentation

## 2019-10-01 LAB — NOVEL CORONAVIRUS, NAA (HOSP ORDER, SEND-OUT TO REF LAB; TAT 18-24 HRS): SARS-CoV-2, NAA: NOT DETECTED

## 2019-10-02 ENCOUNTER — Other Ambulatory Visit: Payer: Self-pay

## 2019-10-02 ENCOUNTER — Ambulatory Visit (HOSPITAL_COMMUNITY)
Admission: RE | Admit: 2019-10-02 | Discharge: 2019-10-02 | Disposition: A | Discharge status: Home / Self Care | Payer: Medicare Other | Attending: Internal Medicine | Admitting: Internal Medicine

## 2019-10-02 ENCOUNTER — Encounter (HOSPITAL_COMMUNITY)
Admission: RE | Disposition: A | Discharge status: Home / Self Care | Payer: Self-pay | Source: Home / Self Care | Attending: Internal Medicine

## 2019-10-02 DIAGNOSIS — M1990 Unspecified osteoarthritis, unspecified site: Secondary | ICD-10-CM | POA: Diagnosis not present

## 2019-10-02 DIAGNOSIS — I4891 Unspecified atrial fibrillation: Secondary | ICD-10-CM

## 2019-10-02 DIAGNOSIS — H409 Unspecified glaucoma: Secondary | ICD-10-CM | POA: Diagnosis not present

## 2019-10-02 DIAGNOSIS — I4892 Unspecified atrial flutter: Secondary | ICD-10-CM | POA: Diagnosis present

## 2019-10-02 DIAGNOSIS — Z79899 Other long term (current) drug therapy: Secondary | ICD-10-CM | POA: Insufficient documentation

## 2019-10-02 DIAGNOSIS — Z7901 Long term (current) use of anticoagulants: Secondary | ICD-10-CM | POA: Insufficient documentation

## 2019-10-02 HISTORY — PX: A-FLUTTER ABLATION: EP1230

## 2019-10-02 SURGERY — A-FLUTTER ABLATION

## 2019-10-02 MED ORDER — MIDAZOLAM HCL 5 MG/5ML IJ SOLN
INTRAMUSCULAR | Status: AC
  Filled 2019-10-02: qty 5

## 2019-10-02 MED ORDER — FLECAINIDE ACETATE 50 MG PO TABS: 50.0000 mg | ORAL_TABLET | Freq: Two times a day (BID) | ORAL | 3 refills | Status: DC

## 2019-10-02 MED ORDER — SODIUM CHLORIDE 0.9 % IV SOLN
INTRAVENOUS | Status: DC
  Administered 2019-10-02: 06:00:00 via INTRAVENOUS

## 2019-10-02 MED ORDER — SODIUM CHLORIDE 0.9% FLUSH: 3.0000 mL | INTRAVENOUS | Status: DC | PRN

## 2019-10-02 MED ORDER — HEPARIN SODIUM (PORCINE) 1000 UNIT/ML IJ SOLN
INTRAMUSCULAR | Status: AC
  Filled 2019-10-02: qty 1

## 2019-10-02 MED ORDER — HEPARIN (PORCINE) IN NACL 1000-0.9 UT/500ML-% IV SOLN
INTRAVENOUS | Status: AC
  Filled 2019-10-02: qty 500

## 2019-10-02 MED ORDER — FENTANYL CITRATE (PF) 100 MCG/2ML IJ SOLN
INTRAMUSCULAR | Status: AC
  Filled 2019-10-02: qty 2

## 2019-10-02 MED ORDER — FENTANYL CITRATE (PF) 100 MCG/2ML IJ SOLN
INTRAMUSCULAR | Status: DC | PRN
  Administered 2019-10-02: 25 ug via INTRAVENOUS
  Administered 2019-10-02 (×2): 12.5 ug via INTRAVENOUS

## 2019-10-02 MED ORDER — BUPIVACAINE HCL (PF) 0.25 % IJ SOLN
INTRAMUSCULAR | Status: AC
  Filled 2019-10-02: qty 60

## 2019-10-02 MED ORDER — SODIUM CHLORIDE 0.9 % IV SOLN: 250.0000 mL | INTRAVENOUS | Status: DC | PRN

## 2019-10-02 MED ORDER — SODIUM CHLORIDE 0.9 % IV SOLN
INTRAVENOUS | Status: AC | PRN
  Administered 2019-10-02: 999 mL/h via INTRAVENOUS

## 2019-10-02 MED ORDER — HEPARIN SODIUM (PORCINE) 1000 UNIT/ML IJ SOLN
INTRAMUSCULAR | Status: DC | PRN
  Administered 2019-10-02: 1000 [IU] via INTRAVENOUS

## 2019-10-02 MED ORDER — ONDANSETRON HCL 4 MG/2ML IJ SOLN: 4.0000 mg | Freq: Four times a day (QID) | INTRAMUSCULAR | Status: DC | PRN

## 2019-10-02 MED ORDER — FLECAINIDE ACETATE 50 MG PO TABS: 50.0000 mg | ORAL_TABLET | Freq: Two times a day (BID) | ORAL | Status: DC

## 2019-10-02 MED ORDER — BUPIVACAINE HCL (PF) 0.25 % IJ SOLN
INTRAMUSCULAR | Status: DC | PRN
  Administered 2019-10-02: 40 mL

## 2019-10-02 MED ORDER — ACETAMINOPHEN 325 MG PO TABS
650.0000 mg | ORAL_TABLET | ORAL | Status: DC | PRN
  Filled 2019-10-02: qty 2

## 2019-10-02 MED ORDER — SODIUM CHLORIDE 0.9% FLUSH: 3.0000 mL | Freq: Two times a day (BID) | INTRAVENOUS | Status: DC

## 2019-10-02 MED ORDER — HEPARIN (PORCINE) IN NACL 1000-0.9 UT/500ML-% IV SOLN
INTRAVENOUS | Status: DC | PRN
  Administered 2019-10-02: 500 mL

## 2019-10-02 MED ORDER — MIDAZOLAM HCL 5 MG/5ML IJ SOLN
INTRAMUSCULAR | Status: DC | PRN
  Administered 2019-10-02: 1 mg via INTRAVENOUS
  Administered 2019-10-02: 2 mg via INTRAVENOUS
  Administered 2019-10-02 (×3): 1 mg via INTRAVENOUS

## 2019-10-02 SURGICAL SUPPLY — 11 items
CATH JOSEPH QUAD ALLRED 6F REP (CATHETERS) ×2 IMPLANT
CATH NAVISTAR SMARTTOUCH FJ (ABLATOR) ×2 IMPLANT
CATH POLARIS X 2.5/5/2.5 DECAP (CATHETERS) ×2 IMPLANT
PACK EP LATEX FREE (CUSTOM PROCEDURE TRAY) ×2
PACK EP LF (CUSTOM PROCEDURE TRAY) ×1 IMPLANT
PAD PRO RADIOLUCENT 2001M-C (PAD) ×3 IMPLANT
PATCH CARTO3 (PAD) ×2 IMPLANT
SHEATH PINNACLE 6F 10CM (SHEATH) ×2 IMPLANT
SHEATH PINNACLE 7F 10CM (SHEATH) ×2 IMPLANT
SHEATH PINNACLE 8F 10CM (SHEATH) ×2 IMPLANT
TUBING SMART ABLATE COOLFLOW (TUBING) ×2 IMPLANT

## 2019-10-02 NOTE — Interval H&P Note (Signed)
History and Physical Interval Note:  10/02/2019 8:03 AM  Kelly Dougherty  has presented today for surgery, with the diagnosis of Atrial flutter.  The various methods of treatment have been discussed with the patient and family. After consideration of risks, benefits and other options for treatment, the patient has consented to  Procedure(s): A-FLUTTER ABLATION (N/A) as a surgical intervention.  The patient's history has been reviewed, patient examined, no change in status, stable for surgery.  I have reviewed the patient's chart and labs.  Questions were answered to the patient's satisfaction.     Cristopher Peru

## 2019-10-02 NOTE — Progress Notes (Signed)
Site area: rt groin fv sheaths x3 Site Prior to Removal:  Level 0 Pressure Applied For: 20 minutes Manual:   yes Patient Status During Pull:  stable Post Pull Site:  Level  0 Post Pull Instructions Given:  yes Post Pull Pulses Present: rt dp palpable Dressing Applied:  Gauze and tegaderm Bedrest begins @ 1000 Comments: IV saline locked

## 2019-10-02 NOTE — Progress Notes (Signed)
Up and walked and tolerated well; right groin stable, no bleeding or hematoma 

## 2019-10-02 NOTE — Discharge Instructions (Signed)
:Restart your eliquis tomorrow 10/6 :Pick up and start new med flecainide  Cardiac Ablation, Care After This sheet gives you information about how to care for yourself after your procedure. Your health care provider may also give you more specific instructions. If you have problems or questions, contact your health care provider. What can I expect after the procedure? After the procedure, it is common to have:  Bruising around your puncture site.  Tenderness around your puncture site.  Skipped heartbeats.  Tiredness (fatigue). Follow these instructions at home: Puncture site care   Follow instructions from your health care provider about how to take care of your puncture site. Make sure you: ? Wash your hands with soap and water before you change your bandage (dressing). If soap and water are not available, use hand sanitizer. ? Change your dressing as told by your health care provider. ? Leave stitches (sutures), skin glue, or adhesive strips in place. These skin closures may need to stay in place for up to 2 weeks. If adhesive strip edges start to loosen and curl up, you may trim the loose edges. Do not remove adhesive strips completely unless your health care provider tells you to do that.  Check your puncture site every day for signs of infection. Check for: ? Redness, swelling, or pain. ? Fluid or blood. If your puncture site starts to bleed, lie down on your back, apply firm pressure to the area, and contact your health care provider. ? Warmth. ? Pus or a bad smell. Driving  Ask your health care provider when it is safe for you to drive again after the procedure.  Do not drive or use heavy machinery while taking prescription pain medicine.  Do not drive for 24 hours if you were given a medicine to help you relax (sedative) during your procedure. Activity  Avoid activities that take a lot of effort for at least 3 days after your procedure.  Do not lift anything that is  heavier than 10 lb (4.5 kg), or the limit that you are told, until your health care provider says that it is safe.  Return to your normal activities as told by your health care provider. Ask your health care provider what activities are safe for you. General instructions  Take over-the-counter and prescription medicines only as told by your health care provider.  Do not use any products that contain nicotine or tobacco, such as cigarettes and e-cigarettes. If you need help quitting, ask your health care provider.  Do not take baths, swim, or use a hot tub until your health care provider approves.  Do not drink alcohol for 24 hours after your procedure.  Keep all follow-up visits as told by your health care provider. This is important. Contact a health care provider if:  You have redness, mild swelling, or pain around your puncture site.  You have fluid or blood coming from your puncture site that stops after applying firm pressure to the area.  Your puncture site feels warm to the touch.  You have pus or a bad smell coming from your puncture site.  You have a fever.  You have chest pain or discomfort that spreads to your neck, jaw, or arm.  You are sweating a lot.  You feel nauseous.  You have a fast or irregular heartbeat.  You have shortness of breath.  You are dizzy or light-headed and feel the need to lie down.  You have pain or numbness in the arm or leg  closest to your puncture site. Get help right away if:  Your puncture site suddenly swells.  Your puncture site is bleeding and the bleeding does not stop after applying firm pressure to the area. These symptoms may represent a serious problem that is an emergency. Do not wait to see if the symptoms will go away. Get medical help right away. Call your local emergency services (911 in the [place] Do not drive yourself to the hospital. Summary  After the procedure, it is normal to have bruising and tenderness at the  puncture site in your groin, neck, or forearm.  Check your puncture site every day for signs of infection.  Get help right away if your puncture site is bleeding and the bleeding does not stop after applying firm pressure to the area. This is a medical emergency. This information is not intended to replace advice given to you by your health care provider. Make sure you discuss any questions you have with your health care provider. Document Released: 03/25/2017 Document Revised: 11/26/2017 Document Reviewed: 03/25/2017 Elsevier Patient Education  [employer].   Flecainide tablets What is this medicine? FLECAINIDE (FLEK a nide) is an antiarrhythmic drug. This medicine is used to prevent irregular heart rhythm. It can also slow down fast heartbeats called tachycardia. This medicine may be used for other purposes; ask your health care provider or pharmacist if you have questions. COMMON BRAND NAME(S): Tambocor What should I tell my health care provider before I take this medicine? They need to know if you have any of these conditions:  abnormal levels of potassium in the blood  heart disease including heart rhythm and heart rate problems  kidney or liver disease  recent heart attack  an unusual or allergic reaction to flecainide, local anesthetics, other medicines, foods, dyes, or preservatives  pregnant or trying to get pregnant  breast-feeding How should I use this medicine? Take this medicine by mouth with a glass of water. Follow the directions on the prescription label. You can take this medicine with or without food. Take your doses at regular intervals. Do not take your medicine more often than directed. Do not stop taking this medicine suddenly. This may cause serious, heart-related side effects. If your doctor wants you to stop the medicine, the dose may be slowly lowered over time to avoid any side effects. Talk to your pediatrician regarding the use of this medicine in  children. While this drug may be prescribed for children as young as 1 year of age for selected conditions, precautions do apply. Overdosage: If you think you have taken too much of this medicine contact a poison control center or emergency room at once. NOTE: This medicine is only for you. Do not share this medicine with others. What if I miss a dose? If you miss a dose, take it as soon as you can. If it is almost time for your next dose, take only that dose. Do not take double or extra doses. What may interact with this medicine? Do not take this medicine with any of the following medications:  amoxapine  arsenic trioxide  certain antibiotics like clarithromycin, erythromycin, gatifloxacin, gemifloxacin, levofloxacin, moxifloxacin, sparfloxacin, or troleandomycin  certain antidepressants called tricyclic antidepressants like amitriptyline, imipramine, or nortriptyline  certain medicines to control heart rhythm like disopyramide, encainide, moricizine, procainamide, propafenone, and quinidine  cisapride  delavirdine  droperidol  haloperidol  haw[place]orn  imatinib  levomethadyl  maprotiline  medicines for malaria like chloroquine and halofantrine  pentamidine  phenothiazines  like chlorpromazine, mesoridazine, prochlorperazine, thioridazine  pimozide  quinine  ranolazine  ritonavir  sertindole This medicine may also interact with the following medications:  cimetidine  dofetilide  medicines for angina or high blood pressure  medicines to control heart rhythm like amiodarone and digoxin  ziprasidone This list may not describe all possible interactions. Give your health care provider a list of all the medicines, herbs, non-prescription drugs, or dietary supplements you use. Also tell them if you smoke, drink alcohol, or use illegal drugs. Some items may interact with your medicine. What should I watch for while using this medicine? Visit your doctor or health  care professional for regular checks on your progress. Because your condition and the use of this medicine carries some risk, it is a good idea to carry an identification card, necklace or bracelet with details of your condition, medications and doctor or health care professional. Check your blood pressure and pulse rate regularly. Ask your health care professional what your blood pressure and pulse rate should be, and when you should contact him or her. Your doctor or health care professional also may schedule regular blood tests and electrocardiograms to check your progress. You may get drowsy or dizzy. Do not drive, use machinery, or do anything that needs mental alertness until you know how this medicine affects you. Do not stand or sit up quickly, especially if you are an older patient. This reduces the risk of dizzy or fainting spells. Alcohol can make you more dizzy, increase flushing and rapid heartbeats. Avoid alcoholic drinks. What side effects may I notice from receiving this medicine? Side effects that you should report to your doctor or health care professional as soon as possible:  chest pain, continued irregular heartbeats  difficulty breathing  swelling of the legs or feet  trembling, shaking  unusually weak or tired Side effects that usually do not require medical attention (report to your doctor or health care professional if they continue or are bothersome):  blurred vision  constipation  headache  nausea, vomiting  stomach pain This list may not describe all possible side effects. Call your doctor for medical advice about side effects. You may report side effects to FDA at 1-800-FDA-1088. Where should I keep my medicine? Keep out of the reach of children. Store at room temperature between 15 and 30 degrees C (59 and 86 degrees F). Protect from light. Keep container tightly closed. Throw away any unused medicine after the expiration date. NOTE: This sheet is a  summary. It may not cover all possible information. If you have questions about this medicine, talk to your doctor, pharmacist, or health care provider.  2020 Elsevier/Gold Standard (2018-12-05 11:41:38)

## 2019-10-03 ENCOUNTER — Encounter (HOSPITAL_COMMUNITY): Payer: Self-pay | Admitting: Internal Medicine

## 2019-10-09 ENCOUNTER — Ambulatory Visit (INDEPENDENT_AMBULATORY_CARE_PROVIDER_SITE_OTHER): Payer: Medicare Other | Admitting: Internal Medicine

## 2019-10-09 ENCOUNTER — Encounter: Payer: Self-pay | Admitting: Internal Medicine

## 2019-10-09 ENCOUNTER — Other Ambulatory Visit: Payer: Self-pay

## 2019-10-09 VITALS — BP 120/70 | HR 70 | Ht 65.0 in | Wt 114.0 lb

## 2019-10-09 DIAGNOSIS — Z79899 Other long term (current) drug therapy: Secondary | ICD-10-CM | POA: Diagnosis not present

## 2019-10-09 NOTE — Progress Notes (Signed)
Pt came in for EKG post Flecainide start.Per pt feels really good today . Reviewed by Dr Rayann Heman  Continue current med and will forward to Dr Lovena Le for review as well./cy

## 2019-10-25 ENCOUNTER — Encounter: Payer: Self-pay | Admitting: Family Medicine

## 2019-11-02 ENCOUNTER — Other Ambulatory Visit: Payer: Self-pay | Admitting: *Deleted

## 2019-11-02 MED ORDER — METOPROLOL TARTRATE 25 MG PO TABS: ORAL_TABLET | ORAL | 3 refills | Status: DC

## 2019-11-07 ENCOUNTER — Encounter: Payer: Self-pay | Admitting: Internal Medicine

## 2019-11-07 ENCOUNTER — Other Ambulatory Visit: Payer: Self-pay

## 2019-11-07 ENCOUNTER — Ambulatory Visit: Payer: Medicare Other | Admitting: Internal Medicine

## 2019-11-07 VITALS — BP 132/76 | HR 64 | Ht 65.0 in | Wt 112.0 lb

## 2019-11-07 DIAGNOSIS — I4892 Unspecified atrial flutter: Secondary | ICD-10-CM | POA: Diagnosis not present

## 2019-11-07 NOTE — Progress Notes (Signed)
HPI Kelly Dougherty returns today after undegoing catheter ablation for atrial flutter. She has had both atrial fib and flutter but mostly flutter in the past. She underwent EP study and catheter ablation several weeks ago. She has done well since then. She has been maintained on low dose flecainide.  No Known Allergies   Current Outpatient Medications  Medication Sig Dispense Refill  . apixaban (ELIQUIS) 5 MG TABS tablet Take 1 tablet (5 mg total) by mouth 2 (two) times daily. 180 tablet 3  . calcium-vitamin D (OSCAL WITH D) 500-200 MG-UNIT tablet Take 1 tablet by mouth 2 (two) times daily.     Kelly Dougherty Calcium (STOOL SOFTENER PO) Take 100 mg by mouth daily.     . flecainide (TAMBOCOR) 50 MG tablet Take 1 tablet (50 mg total) by mouth every 12 (twelve) hours. 60 tablet 3  . furosemide (LASIX) 20 MG tablet TAKE 1 TABLET BY MOUTH DAILY AS NEEDED FOR SWELLING (Patient taking differently: Take 20 mg by mouth daily as needed for edema. ) 90 tablet 3  . gentamicin ointment (GARAMYCIN) 0.1 % Apply 1 application topically See admin instructions. Bandage changes    . latanoprost (XALATAN) 0.005 % ophthalmic solution Place 1 drop into both eyes at bedtime.    . metoprolol tartrate (LOPRESSOR) 25 MG tablet Take 1 tablet in the AM and 1/2 tablet in the PM 135 tablet 3  . potassium chloride (K-DUR,KLOR-CON) 10 MEQ tablet TAKE 1 TABLET BY MOUTH ONLY ON DAYS YOUR TAKING LASIX FOR SWELLING (Patient taking differently: Take 10 mEq by mouth daily as needed. TAKE 10 meqTABLET BY MOUTH ONLY ON DAYS YOUR TAKING LASIX FOR SWELLING) 90 tablet 3  . timolol (BETIMOL) 0.5 % ophthalmic solution Place 1 drop into the left eye 2 (two) times daily.    . timolol (TIMOPTIC) 0.5 % ophthalmic solution Place 1 drop into the left eye 2 (two) times daily.     No current facility-administered medications for this visit.      Past Medical History:  Diagnosis Date  . Arthritis   . Atrial fibrillation ([place])   . Cataract    . Dysrhythmia    04/2015 with atrial fibrillation  . Glaucoma   . History of echocardiogram    Echo 1/19: EF 60-65, no RWMA, normal diastolic function, mild to mod AI, MAC, mild to mod MR, mild BAE, mod TR, PASP 40    ROS:   All systems reviewed and negative except as noted in the HPI.   Past Surgical History:  Procedure Laterality Date  . A-FLUTTER ABLATION N/A 10/02/2019   Procedure: A-FLUTTER ABLATION;  Surgeon: Evans Lance, MD;  Location: [facility];  Service: Cardiovascular;  Laterality: N/A;  . APPENDECTOMY     78 years of age  . CARDIOVERSION N/A 10/03/2015   Procedure: CARDIOVERSION;  Surgeon: Herminio Commons, MD;  Location: [facility];  Service: Cardiovascular;  Laterality: N/A;  . COLONOSCOPY N/A 07/30/2016   Procedure: COLONOSCOPY;  Surgeon: Rogene Houston, MD;  Location: [facility];  Service: Endoscopy;  Laterality: N/A;  8:30  . EYE SURGERY     left  . HEMORROIDECTOMY    . TONSILLECTOMY     childhood     Family History  Problem Relation Age of Onset  . Hearing loss Mother   . Alzheimer's disease Father      Social History   Socioeconomic History  . Marital status: Married    Spouse name:  Not on file  . Number of children: Not on file  . Years of education: Not on file  . Highest education level: Not on file  Occupational History  . Not on file  Social Needs  . Financial resource strain: Not on file  . Food insecurity    Worry: Not on file    Inability: Not on file  . Transportation needs    Medical: Not on file    Non-medical: Not on file  Tobacco Use  . Smoking status: Never Smoker  . Smokeless tobacco: Never Used  Substance and Sexual Activity  . Alcohol use: Yes    Alcohol/week: 7.0 standard drinks    Types: 7 Glasses of wine per week    Comment: one glass of wine with dinner each night  . Drug use: No  . Sexual activity: Not Currently  Lifestyle  . Physical activity    Days per week: Not on file    Minutes per session:  Not on file  . Stress: Not on file  Relationships  . Social Herbalist on phone: Not on file    Gets together: Not on file    Attends religious service: Not on file    Active member of club or organization: Not on file    Attends meetings of clubs or organizations: Not on file    Relationship status: Not on file  . Intimate partner violence    Fear of current or ex partner: Not on file    Emotionally abused: Not on file    Physically abused: Not on file    Forced sexual activity: Not on file  Other Topics Concern  . Not on file  Social History Narrative  . Not on file     BP 132/76   Pulse 64   Ht _0  (1.651 m)   Wt 112 lb (50.8 kg)   SpO2 97%   BMI 18.64 kg/m   Physical Exam:  Well appearing NAD HEENT: Unremarkable Neck:  No JVD, no thyromegally Lymphatics:  No adenopathy Back:  No CVA tenderness Lungs:  Clear with no wheezes HEART:  Regular rate rhythm, no murmurs, no rubs, no clicks Abd:  soft, positive bowel sounds, no organomegally, no rebound, no guarding Ext:  2 plus pulses, no edema, no cyanosis, no clubbing Skin:  No rashes no nodules Neuro:  CN II through XII intact, motor grossly intact  EKG - nsr  Assess/Plan: 1. Atrial fib - she will continue low dose flecainide and eliquis. 2. Atrial flutter - she is s/p catheter ablation. 3. Weight - her weight is low and I have asked her to try and gain 5 lbs.  Mikle Bosworth.D.

## 2019-11-07 NOTE — Patient Instructions (Addendum)
Medication Instructions:  Your physician recommends that you continue on your current medications as directed. Please refer to the Current Medication list given to you today.  Labwork: None ordered.  Testing/Procedures: None ordered.  Follow-Up: Your physician wants you to follow-up in: 6 months with Dr. Taylor in [place].  You will receive a reminder letter in the mail two months in advance. If you don't receive a letter, please call our office to schedule the follow-up appointment.   Any Other Special Instructions Will Be Listed Below (If Applicable).     If you need a refill on your cardiac medications before your next appointment, please call your pharmacy.   

## 2019-11-28 ENCOUNTER — Ambulatory Visit: Payer: Medicare Other | Admitting: Cardiovascular Disease

## 2019-11-28 ENCOUNTER — Encounter: Payer: Self-pay | Admitting: Cardiovascular Disease

## 2019-11-28 ENCOUNTER — Encounter: Payer: Self-pay | Admitting: Internal Medicine

## 2019-11-28 ENCOUNTER — Other Ambulatory Visit: Payer: Self-pay

## 2019-11-28 VITALS — BP 118/78 | HR 60 | Ht 65.0 in | Wt 113.0 lb

## 2019-11-28 DIAGNOSIS — Z8679 Personal history of other diseases of the circulatory system: Secondary | ICD-10-CM

## 2019-11-28 DIAGNOSIS — I480 Paroxysmal atrial fibrillation: Secondary | ICD-10-CM

## 2019-11-28 DIAGNOSIS — I4892 Unspecified atrial flutter: Secondary | ICD-10-CM

## 2019-11-28 DIAGNOSIS — Z9889 Other specified postprocedural states: Secondary | ICD-10-CM

## 2019-11-28 DIAGNOSIS — I38 Endocarditis, valve unspecified: Secondary | ICD-10-CM | POA: Diagnosis not present

## 2019-11-28 NOTE — Patient Instructions (Signed)

## 2019-11-28 NOTE — Progress Notes (Signed)
SUBJECTIVE: The patient presents for routine follow-up.  She underwent ablation for atrial flutter on 10/02/2019 by Dr. Lovena Le.  She has been doing well since that time.  The patient denies any symptoms of chest pain, palpitations, shortness of breath, lightheadedness, dizziness, leg swelling, orthopnea, PND, and syncope.    Soc Hx: Married in 1959. Husband is a retired Marine scientist and studied at [employer]. 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 3 living children, 1 son committed suicide.  Review of Systems: As per "subjective", otherwise negative.  No Known Allergies  Current Outpatient Medications  Medication Sig Dispense Refill  . apixaban (ELIQUIS) 5 MG TABS tablet Take 1 tablet (5 mg total) by mouth 2 (two) times daily. 180 tablet 3  . calcium-vitamin D (OSCAL WITH D) 500-200 MG-UNIT tablet Take 1 tablet by mouth 2 (two) times daily.     [name] Calcium (STOOL SOFTENER PO) Take 100 mg by mouth daily.     . flecainide (TAMBOCOR) 50 MG tablet Take 1 tablet (50 mg total) by mouth every 12 (twelve) hours. 60 tablet 3  . furosemide (LASIX) 20 MG tablet TAKE 1 TABLET BY MOUTH DAILY AS NEEDED FOR SWELLING (Patient taking differently: Take 20 mg by mouth daily as needed for edema. ) 90 tablet 3  . gentamicin ointment (GARAMYCIN) 0.1 % Apply 1 application topically See admin instructions. Bandage changes    . latanoprost (XALATAN) 0.005 % ophthalmic solution Place 1 drop into both eyes at bedtime.    . metoprolol tartrate (LOPRESSOR) 25 MG tablet Take 1 tablet in the AM and 1/2 tablet in the PM 135 tablet 3  . potassium chloride (K-DUR,KLOR-CON) 10 MEQ tablet TAKE 1 TABLET BY MOUTH ONLY ON DAYS YOUR TAKING LASIX FOR SWELLING (Patient taking differently: Take 10 mEq by mouth daily as needed. TAKE 10 meqTABLET BY MOUTH ONLY ON DAYS YOUR TAKING LASIX FOR SWELLING) 90 tablet 3  . timolol (BETIMOL) 0.5 % ophthalmic solution Place 1 drop into the left eye 2  (two) times daily.    . timolol (TIMOPTIC) 0.5 % ophthalmic solution Place 1 drop into the left eye 2 (two) times daily.     No current facility-administered medications for this visit.     Past Medical History:  Diagnosis Date  . Arthritis   . Atrial fibrillation ([place])   . Cataract   . Dysrhythmia    04/2015 with atrial fibrillation  . Glaucoma   . History of echocardiogram    Echo 1/19: EF 60-65, no RWMA, normal diastolic function, mild to mod AI, MAC, mild to mod MR, mild BAE, mod TR, PASP 40    Past Surgical History:  Procedure Laterality Date  . A-FLUTTER ABLATION N/A 10/02/2019   Procedure: A-FLUTTER ABLATION;  Surgeon: Evans Lance, MD;  Location: [facility];  Service: Cardiovascular;  Laterality: N/A;  . APPENDECTOMY     78 years of age  . CARDIOVERSION N/A 10/03/2015   Procedure: CARDIOVERSION;  Surgeon: Herminio Commons, MD;  Location: [facility];  Service: Cardiovascular;  Laterality: N/A;  . COLONOSCOPY N/A 07/30/2016   Procedure: COLONOSCOPY;  Surgeon: Rogene Houston, MD;  [facility];  Service: Endoscopy;  Laterality: N/A;  8:30  . EYE SURGERY     left  . HEMORROIDECTOMY    . TONSILLECTOMY     childhood    Social History   Socioeconomic History  . Marital status: Married  Spouse name: Not on file  . Number of children: Not on file  . Years of education: Not on file  . Highest education level: Not on file  Occupational History  . Not on file  Social Needs  . Financial resource strain: Not on file  . Food insecurity    Worry: Not on file    Inability: Not on file  . Transportation needs    Medical: Not on file    Non-medical: Not on file  Tobacco Use  . Smoking status: Never Smoker  . Smokeless tobacco: Never Used  Substance and Sexual Activity  . Alcohol use: Yes    Alcohol/week: 7.0 standard drinks    Types: 7 Glasses of wine per week    Comment: one glass of wine with dinner each night  . Drug use: No  . Sexual activity:  Not Currently  Lifestyle  . Physical activity    Days per week: Not on file    Minutes per session: Not on file  . Stress: Not on file  Relationships  . Social Herbalist on phone: Not on file    Gets together: Not on file    Attends religious service: Not on file    Active member of club or organization: Not on file    Attends meetings of clubs or organizations: Not on file    Relationship status: Not on file  . Intimate partner violence    Fear of current or ex partner: Not on file    Emotionally abused: Not on file    Physically abused: Not on file    Forced sexual activity: Not on file  Other Topics Concern  . Not on file  Social History Narrative  . Not on file     Vitals:   11/28/19 1404  BP: 118/78  Pulse: 60  Weight: 113 lb (51.3 kg)  Height: _0  (1.651 m)    Wt Readings from Last 3 Encounters:  11/28/19 113 lb (51.3 kg)  11/07/19 112 lb (50.8 kg)  10/09/19 114 lb (51.7 kg)     PHYSICAL EXAM General: NAD HEENT: Normal. Neck: No JVD, no thyromegaly. Lungs: Clear to auscultation bilaterally with normal respiratory effort. CV: Regular rate and rhythm, normal S1/S2, no S3/S4, no murmur. No pretibial or periankle edema.  No carotid bruit.   Abdomen: Soft, nontender, no distention.  Neurologic: Alert and oriented.  Psych: Normal affect. Skin: Normal. Musculoskeletal: No gross deformities.      Labs: Lab Results  Component Value Date/Time   K 4.4 09/21/2019 01:08 PM   BUN 13 09/21/2019 01:08 PM   CREATININE 0.76 09/21/2019 01:08 PM   CREATININE 0.85 01/09/2019 09:04 AM   ALT 35 (H) 01/09/2019 09:04 AM   TSH 2.404 01/12/2018 01:44 PM   TSH 1.916 10/29/2015 10:20 AM   HGB 14.7 09/21/2019 01:08 PM     Lipids: Lab Results  Component Value Date/Time   LDLCALC 105 (H) 01/09/2019 09:04 AM   CHOL 188 01/09/2019 09:04 AM   TRIG 65 01/09/2019 09:04 AM   HDL 68 01/09/2019 09:04 AM      Echocardiogram 09/07/2019:   1. The left  ventricle has normal systolic function, with an ejection fraction of 55-60%. The cavity size was normal. Left ventricular diastolic Doppler parameters are indeterminate.  2. The right ventricle has normal systolic function. The cavity was normal. There is no increase in right ventricular wall thickness.  3. Left atrial size was severely dilated.  4. Right atrial size was severely dilated.  5. The mitral valve is abnormal. Mild thickening of the mitral valve leaflet. Mild calcification of the mitral valve leaflet. There is mild mitral annular calcification present.  6. The tricuspid valve is abnormal. 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  7. The aortic valve is tricuspid. Aortic valve regurgitation is mild by color flow Doppler. No stenosis of the aortic valve.  8. The aorta is normal unless otherwise noted.  9. The aortic root is normal in size and structure. 10. Pulmonary hypertension is mildly elevated, PASP is 35 mmHg. 11. The inferior vena cava was dilated in size with >50% respiratory variability.   ASSESSMENT AND PLAN: 1.  Paroxysmal atrial fibrillation: Symptomatically stable on flecainide.  Also on metoprolol.  Continue Eliquis for anticoagulation.  2.  Atrial flutter: Status post ablation in October 2020.  3.  Valvular heart disease: Echocardiogram on 09/07/2019 demonstrated moderate to severe tricuspid regurgitation and mild aortic regurgitation.  There was severe biatrial enlargement.    Disposition: Follow up 1 yr   Kate Sable, M.D., F.A.C.C.

## 2019-11-28 NOTE — Telephone Encounter (Signed)
error 

## 2019-11-30 ENCOUNTER — Ambulatory Visit: Payer: Medicare Other | Admitting: Cardiovascular Disease

## 2019-12-04 ENCOUNTER — Telehealth: Payer: Self-pay

## 2019-12-04 NOTE — Telephone Encounter (Signed)
Left detailed message for Pt.  Advised had received her letter and made outreach to [employer].  Per precert-Precert dept had contacted [facility] and was notified that NO precert required for procedure.  Per manager in precert-AM] Al[employer] The patient will not be billed for the balance. It has been adjusted off of her account  Advised Pt her account should be settled.  Advised to call if any further needs.

## 2019-12-26 ENCOUNTER — Encounter: Payer: Self-pay | Admitting: Family Medicine

## 2019-12-26 ENCOUNTER — Other Ambulatory Visit: Payer: Self-pay

## 2019-12-26 ENCOUNTER — Ambulatory Visit (INDEPENDENT_AMBULATORY_CARE_PROVIDER_SITE_OTHER): Payer: Medicare Other | Admitting: Family Medicine

## 2019-12-26 VITALS — BP 119/60 | HR 57 | Temp 98.1°F | Ht 65.0 in | Wt 114.6 lb

## 2019-12-26 DIAGNOSIS — I482 Chronic atrial fibrillation, unspecified: Secondary | ICD-10-CM | POA: Diagnosis not present

## 2019-12-26 DIAGNOSIS — M810 Age-related osteoporosis without current pathological fracture: Secondary | ICD-10-CM | POA: Diagnosis not present

## 2019-12-26 DIAGNOSIS — L309 Dermatitis, unspecified: Secondary | ICD-10-CM | POA: Insufficient documentation

## 2019-12-26 DIAGNOSIS — E785 Hyperlipidemia, unspecified: Secondary | ICD-10-CM | POA: Diagnosis not present

## 2019-12-26 DIAGNOSIS — H409 Unspecified glaucoma: Secondary | ICD-10-CM

## 2019-12-26 DIAGNOSIS — I429 Cardiomyopathy, unspecified: Secondary | ICD-10-CM

## 2019-12-26 DIAGNOSIS — R011 Cardiac murmur, unspecified: Secondary | ICD-10-CM

## 2019-12-26 MED ORDER — MUPIROCIN 2 % EX OINT: 1.0000 "application " | TOPICAL_OINTMENT | Freq: Every day | CUTANEOUS | 0 refills | Status: DC

## 2019-12-26 NOTE — Patient Instructions (Signed)
Bone density  Bactroban for nasal irritation

## 2019-12-26 NOTE — Progress Notes (Signed)
Established Patient Office Visit  Subjective:  Patient ID: Kelly Dougherty, female    DOB: 07-13-1941  Age: 78 y.o. MRN: [id number]  CC:  Chief Complaint  Patient presents with  . Establish Care  . Nasal Congestion    sores inside    HPI Kelly Dougherty presents for :  cardiology-EKG - nsr-11/20 Echocardiogram 09/07/2019: 1. The left ventricle has normal systolic function, with an ejection fraction of 55-60%. The cavity size was normal. Left ventricular diastolic Doppler parameters are indeterminate. 2. The right ventricle has normal systolic function. The cavity was normal. There is no increase in right ventricular wall thickness. 3. Left atrial size was severely dilated. 4. Right atrial size was severely dilated. 5. The mitral valve is abnormal. Mild thickening of the mitral valve leaflet. Mild calcification of the mitral valve leaflet. There is mild mitral annular calcification present. 6. The tricuspid valve is abnormal. 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 7. The aortic valve is tricuspid. Aortic valve regurgitation is mild by color flow Doppler. No stenosis of the aortic valve. 8. The aorta is normal unless otherwise noted. 9. The aortic root is normal in size and structure. 10. Pulmonary hypertension is mildly elevated, PASP is 35 mmHg. 11. The inferior vena cava was dilated in size with >50% respiratory variability. Assess/Plan: 1. Atrial fib - she will continue low dose flecainide and eliquis. 2. Atrial flutter - she is s/p catheter ablation. 10/20 3. Weight - her weight is low and I have asked her to try and gain 5 lbs. Gregg Taylor,M.D.  Derm-basal cell -right leg-tibia plateau-[facility]  [facility] Rock Nep[facility]  Nasal irritation noted intermittently with subsequent bleeding  Past Medical History:  Diagnosis Date  . Arthritis   . Atrial fibrillation ([place])   .  Cataract   . Dysrhythmia    04/2015 with atrial fibrillation  . Glaucoma   . History of echocardiogram    Echo 1/19: EF 60-65, no RWMA, normal diastolic function, mild to mod AI, MAC, mild to mod MR, mild BAE, mod TR, PASP 40    Past Surgical History:  Procedure Laterality Date  . A-FLUTTER ABLATION N/A 10/02/2019   Procedure: A-FLUTTER ABLATION;  Surgeon: Evans Lance, MD;  Location: [facility];  Service: Cardiovascular;  Laterality: N/A;  . APPENDECTOMY     78 years of age  . CARDIOVERSION N/A 10/03/2015   Procedure: CARDIOVERSION;  Surgeon: Herminio Commons, MD;  Location: [facility];  Service: Cardiovascular;  Laterality: N/A;  . COLONOSCOPY N/A 07/30/2016   Procedure: COLONOSCOPY;  Surgeon: Rogene Houston, MD;  [facility];  Service: Endoscopy;  Laterality: N/A;  8:30  . EYE SURGERY     left  . HEMORROIDECTOMY    . TONSILLECTOMY     childhood    Family History  Problem Relation Age of Onset  . Hearing loss Mother   . Alzheimer's disease Father     Social History   Socioeconomic History  . Marital status: Married    Spouse name: Not on file  . Number of children: Not on file  . Years of education: Not on file  . Highest education level: Not on file  Occupational History  . Not on file  Tobacco Use  . Smoking status: Never Smoker  . Smokeless tobacco: Never Used  Substance and Sexual Activity  . Alcohol use: Yes    Alcohol/week: 7.0 standard drinks  Types: 7 Glasses of wine per week    Comment: one glass of wine with dinner each night  . Drug use: No  . Sexual activity: Not Currently  Other Topics Concern  . Not on file  Social History Narrative  . Not on file   Social Determinants of Health   Financial Resource Strain:   . Difficulty of Paying Living Expenses: Not on file  Food Insecurity:   . Worried About Charity fundraiser in the Last Year: Not on file  . Ran Out of Food in the Last Year: Not on file  Transportation Needs:    . Lack of Transportation (Medical): Not on file  . Lack of Transportation (Non-Medical): Not on file  Physical Activity:   . Days of Exercise per Week: Not on file  . Minutes of Exercise per Session: Not on file  Stress:   . Feeling of Stress : Not on file  Social Connections:   . Frequency of Communication with Friends and Family: Not on file  . Frequency of Social Gatherings with Friends and Family: Not on file  . Attends Religious Services: Not on file  . Active Member of Clubs or Organizations: Not on file  . Attends Archivist Meetings: Not on file  . Marital Status: Not on file  Intimate Partner Violence:   . Fear of Current or Ex-Partner: Not on file  . Emotionally Abused: Not on file  . Physically Abused: Not on file  . Sexually Abused: Not on file    Outpatient Medications Prior to Visit  Medication Sig Dispense Refill  . apixaban (ELIQUIS) 5 MG TABS tablet Take 1 tablet (5 mg total) by mouth 2 (two) times daily. 180 tablet 3  . calcium-vitamin D (OSCAL WITH D) 500-200 MG-UNIT tablet Take 1 tablet by mouth 2 (two) times daily.     [name] Calcium (STOOL SOFTENER PO) Take 100 mg by mouth daily.     . flecainide (TAMBOCOR) 50 MG tablet Take 1 tablet (50 mg total) by mouth every 12 (twelve) hours. 60 tablet 3  . furosemide (LASIX) 20 MG tablet TAKE 1 TABLET BY MOUTH DAILY AS NEEDED FOR SWELLING (Patient taking differently: Take 20 mg by mouth daily as needed for edema. ) 90 tablet 3  . latanoprost (XALATAN) 0.005 % ophthalmic solution Place 1 drop into both eyes at bedtime.    . metoprolol tartrate (LOPRESSOR) 25 MG tablet Take 1 tablet in the AM and 1/2 tablet in the PM 135 tablet 3  . potassium chloride (K-DUR,KLOR-CON) 10 MEQ tablet TAKE 1 TABLET BY MOUTH ONLY ON DAYS YOUR TAKING LASIX FOR SWELLING (Patient taking differently: Take 10 mEq by mouth daily as needed. TAKE 10 meqTABLET BY MOUTH ONLY ON DAYS YOUR TAKING LASIX FOR SWELLING) 90 tablet 3  . timolol  (BETIMOL) 0.5 % ophthalmic solution Place 1 drop into the left eye 2 (two) times daily.    . timolol (TIMOPTIC) 0.5 % ophthalmic solution Place 1 drop into the left eye 2 (two) times daily.    Marland Kitchen gentamicin ointment (GARAMYCIN) 0.1 % Apply 1 application topically See admin instructions. Bandage changes     No facility-administered medications prior to visit.    No Known Allergies  ROS Review of Systems  Constitutional: Negative.   HENT: Positive for postnasal drip.        Sores inside nose  Eyes:       Glaucoma  Respiratory: Negative.   Cardiovascular: Positive for palpitations.  Gastrointestinal: Negative.   Endocrine: Negative.   Genitourinary: Negative.   Musculoskeletal: Positive for arthralgias.  Skin:       Basal cell  Allergic/Immunologic: Negative.   Neurological: Negative.   Hematological: Negative.   Psychiatric/Behavioral: Negative.       Objective:    Physical Exam  Constitutional: She is oriented to person, place, and time. She appears well-developed and well-nourished. No distress.  HENT:  Head: Atraumatic.  Nose: Nose normal.  Mouth/Throat: Oropharynx is clear and moist.  Eyes: Conjunctivae are normal.  Cardiovascular: Normal rate and regular rhythm.  Pulmonary/Chest: Effort normal and breath sounds normal.  Abdominal: Soft. Bowel sounds are normal.  Musculoskeletal:        General: Normal range of motion.     Cervical back: Normal range of motion.  Neurological: She is oriented to person, place, and time.  Skin: Skin is warm.  Psychiatric: She has a normal mood and affect. Her behavior is normal.    BP 119/60 (BP Location: Left Arm, Patient Position: Sitting, Cuff Size: Small)   Pulse (!) 57   Temp 98.1 F (36.7 C) (Oral)   Ht _0  (1.651 m)   Wt 114 lb 9.6 oz (52 kg)   SpO2 (!) 83%   BMI 19.07 kg/m  Wt Readings from Last 3 Encounters:  12/26/19 114 lb 9.6 oz (52 kg)  11/28/19 113 lb (51.3 kg)  11/07/19 112 lb (50.8 kg)     Lab  Results  Component Value Date   TSH 2.404 01/12/2018   Lab Results  Component Value Date   WBC 5.1 09/21/2019   HGB 14.7 09/21/2019   HCT 42.8 09/21/2019   MCV 93 09/21/2019   PLT 211 09/21/2019   Lab Results  Component Value Date   NA 137 09/21/2019   K 4.4 09/21/2019   CO2 24 09/21/2019   GLUCOSE 87 09/21/2019   BUN 13 09/21/2019   CREATININE 0.76 09/21/2019   BILITOT 0.6 01/09/2019   ALKPHOS 55 01/04/2017   AST 36 (H) 01/09/2019   ALT 35 (H) 01/09/2019   PROT 6.8 01/09/2019   ALBUMIN 4.1 01/04/2017   CALCIUM 9.6 09/21/2019   ANIONGAP 8 10/02/2015   Lab Results  Component Value Date   CHOL 188 01/09/2019   Lab Results  Component Value Date   HDL 68 01/09/2019   Lab Results  Component Value Date   LDLCALC 105 (H) 01/09/2019   Lab Results  Component Value Date   TRIG 65 01/09/2019   Lab Results  Component Value Date   CHOLHDL 2.8 01/09/2019      Assessment & Plan:  1. Age-related osteoporosis without current pathological fracture Calcium +Vit D-no medication-concern for cardiac testing - DG DXA FRACTURE ASSESSMENT; Future 2. Mild hyperlipidemia Lipid panel normal range 3. Chronic atrial fibrillation (HCC) Cardio following 4. Cardiomyopathy, unspecified type ([place]) Cardio following 5. Heart murmur Cardio following 6. Glaucoma, unspecified glaucoma type, unspecified laterality Eye specialist following Follow-up: bactroban -apply in the nose-f/u with dermatology-concern for staph infection causing intermittent irritation  DEXA  Kelly Grondin Hannah Beat, MD

## 2019-12-27 ENCOUNTER — Telehealth: Payer: Self-pay | Admitting: Family Medicine

## 2019-12-27 NOTE — Telephone Encounter (Signed)
Routing to Dr. Corum 

## 2019-12-27 NOTE — Telephone Encounter (Signed)
Patient was seen Dr. Holly Bodily yesterday 12/26/19 and discussed a dermatologist. She could not rememeber the name at the time. She states it is Dr. Karin Golden at [facility].

## 2020-01-22 ENCOUNTER — Other Ambulatory Visit: Payer: Self-pay

## 2020-01-22 MED ORDER — FLECAINIDE ACETATE 50 MG PO TABS: 50.0000 mg | ORAL_TABLET | Freq: Two times a day (BID) | ORAL | 3 refills | Status: DC

## 2020-02-27 ENCOUNTER — Ambulatory Visit (INDEPENDENT_AMBULATORY_CARE_PROVIDER_SITE_OTHER): Payer: Medicare PPO | Admitting: Family Medicine

## 2020-02-27 ENCOUNTER — Other Ambulatory Visit: Payer: Self-pay

## 2020-02-27 ENCOUNTER — Encounter: Payer: Self-pay | Admitting: Family Medicine

## 2020-02-27 VITALS — BP 112/62 | HR 66 | Temp 98.2°F | Resp 14 | Ht 65.0 in | Wt 117.0 lb

## 2020-02-27 DIAGNOSIS — M810 Age-related osteoporosis without current pathological fracture: Secondary | ICD-10-CM

## 2020-02-27 DIAGNOSIS — E785 Hyperlipidemia, unspecified: Secondary | ICD-10-CM

## 2020-02-27 DIAGNOSIS — R350 Frequency of micturition: Secondary | ICD-10-CM | POA: Diagnosis not present

## 2020-02-27 DIAGNOSIS — I482 Chronic atrial fibrillation, unspecified: Secondary | ICD-10-CM | POA: Diagnosis not present

## 2020-02-27 LAB — URINALYSIS, ROUTINE W REFLEX MICROSCOPIC
Bacteria, UA: NONE SEEN /HPF
Bilirubin Urine: NEGATIVE
Glucose, UA: NEGATIVE
Hyaline Cast: NONE SEEN /LPF
Ketones, ur: NEGATIVE
Leukocytes,Ua: NEGATIVE
Nitrite: NEGATIVE
Protein, ur: NEGATIVE
Specific Gravity, Urine: 1.015 (ref 1.001–1.03)
WBC, UA: NONE SEEN /HPF (ref 0–5)
pH: 6.5 (ref 5.0–8.0)

## 2020-02-27 LAB — MICROSCOPIC MESSAGE

## 2020-02-27 NOTE — Assessment & Plan Note (Signed)
She will get fasting labs done

## 2020-02-27 NOTE — Assessment & Plan Note (Signed)
Bone density to be ordered continue calcium and vitamin D.

## 2020-02-27 NOTE — Assessment & Plan Note (Signed)
Followed by cardiology currently in sinus rhythm and rate controlled.

## 2020-02-27 NOTE — Progress Notes (Signed)
   Subjective:    Patient ID: Kelly Dougherty, female    DOB: [birth year], 79 y.o.   MRN: [id number]  Patient presents for Follow-up (is not fasting- has not been seen >1 year) Patient here to follow-up chronic medical problems.  She was last seen in January 2020 then she establish with a local physician near her home but that physician is leaving.   Wears a poise pad for mild bladder leakage, but she has had increased urinary frequency at time.  But she does drink a significant amount of water.  At times however she gets a burning sensation this is intermittent and does clear when she drinks more water.  Denies any change in her bowel movements.   Osteoporosis- taking calcium and vitamin D, due to her cardiac medications and interactions does not want medications,  She would like to have Bone Density - she was on actonel in the past   A fib- rate controlled, chronic afib, reviewed cardiology note, no changes to meds     Glaucoma- followed every 6 months   She had metabolic and CBC done in September 2020 with cardiology which was unremarkable.  She is due for lipid panel   [facility] as she has had basal cell carcinoma on her leg as well as her nose  Review Of Systems:  GEN- denies fatigue, fever, weight loss,weakness, recent illness HEENT- denies eye drainage, change in vision, nasal discharge, CVS- denies chest pain, palpitations RESP- denies SOB, cough, wheeze ABD- denies N/V, change in stools, abd pain GU- denies dysuria, hematuria, dribbling, incontinence MSK- denies joint pain, muscle aches, injury Neuro- denies headache, dizziness, syncope, seizure activity       Objective:    BP 112/62   Pulse 66   Temp 98.2 F (36.8 C) (Temporal)   Resp 14   Ht 5\' 5"  (1.651 m)   Wt 117 lb (53.1 kg)   SpO2 98%   BMI 19.47 kg/m  GEN- NAD, alert and oriented x3 HEENT- PERRL, EOMI, non injected sclera, pink conjunctiva, MMM, oropharynx clear Neck- Supple, no  thyromegaly CVS- RRR, no murmur RESP-CTAB ABD-NABS,soft,NT,ND EXT- No edema Pulses- Radial, DP- 2+        Assessment & Plan:      Problem List Items Addressed This Visit      Unprioritized   Chronic atrial fibrillation (HCC) - Primary    Followed by cardiology currently in sinus rhythm and rate controlled.      Relevant Orders   CBC with Differential/Platelet   Comprehensive metabolic panel   Mild hyperlipidemia    She will get fasting labs done      Relevant Orders   Lipid panel   Osteoporosis    Bone density to be ordered continue calcium and vitamin D.      Relevant Orders   DG Bone Density    Other Visit Diagnoses    Urinary frequency       Check urinalysis and culture.  We did discuss OAB as well.   Relevant Orders   Urinalysis, Routine w reflex microscopic (Completed)   Urine Culture      Note: This dictation was prepared with Dragon dictation along with smaller phrase technology. Any transcriptional errors that result from this process are unintentional.

## 2020-02-27 NOTE — Patient Instructions (Signed)
We will call with results F/U 1 year for Physical

## 2020-02-28 LAB — URINE CULTURE
MICRO NUMBER:: 10204717
SPECIMEN QUALITY:: ADEQUATE

## 2020-03-04 DIAGNOSIS — I482 Chronic atrial fibrillation, unspecified: Secondary | ICD-10-CM | POA: Diagnosis not present

## 2020-03-04 DIAGNOSIS — E785 Hyperlipidemia, unspecified: Secondary | ICD-10-CM | POA: Diagnosis not present

## 2020-03-04 LAB — COMPREHENSIVE METABOLIC PANEL
AG Ratio: 1.2 (calc) (ref 1.0–2.5)
ALT: 13 U/L (ref 6–29)
AST: 22 U/L (ref 10–35)
Albumin: 3.7 g/dL (ref 3.6–5.1)
Alkaline phosphatase (APISO): 75 U/L (ref 37–153)
BUN: 9 mg/dL (ref 7–25)
CO2: 29 mmol/L (ref 20–32)
Calcium: 9.2 mg/dL (ref 8.6–10.4)
Chloride: 102 mmol/L (ref 98–110)
Creat: 0.74 mg/dL (ref 0.60–0.88)
Globulin: 3.1 g/dL (calc) (ref 1.9–3.7)
Glucose, Bld: 86 mg/dL (ref 65–99)
Potassium: 3.9 mmol/L (ref 3.5–5.3)
Sodium: 140 mmol/L (ref 135–146)
Total Bilirubin: 0.4 mg/dL (ref 0.2–1.2)
Total Protein: 6.8 g/dL (ref 6.1–8.1)

## 2020-03-04 LAB — CBC WITH DIFFERENTIAL/PLATELET
Absolute Monocytes: 699 cells/uL (ref 200–950)
Basophils Absolute: 41 cells/uL (ref 0–200)
Basophils Relative: 0.8 %
Eosinophils Absolute: 102 cells/uL (ref 15–500)
Eosinophils Relative: 2 %
HCT: 38.8 % (ref 35.0–45.0)
Hemoglobin: 12.8 g/dL (ref 11.7–15.5)
Lymphs Abs: 1867 cells/uL (ref 850–3900)
MCH: 30.8 pg (ref 27.0–33.0)
MCHC: 33 g/dL (ref 32.0–36.0)
MCV: 93.5 fL (ref 80.0–100.0)
MPV: 10.2 fL (ref 7.5–12.5)
Monocytes Relative: 13.7 %
Neutro Abs: 2392 cells/uL (ref 1500–7800)
Neutrophils Relative %: 46.9 %
Platelets: 233 10*3/uL (ref 140–400)
RBC: 4.15 10*6/uL (ref 3.80–5.10)
RDW: 11.8 % (ref 11.0–15.0)
Total Lymphocyte: 36.6 %
WBC: 5.1 10*3/uL (ref 3.8–10.8)

## 2020-03-04 LAB — LIPID PANEL
Cholesterol: 191 mg/dL (ref <200)
HDL: 54 mg/dL (ref >=50)
LDL Cholesterol (Calc): 121 mg/dL (calc) — ABNORMAL HIGH
Non-HDL Cholesterol (Calc): 137 mg/dL (calc) — ABNORMAL HIGH (ref <130)
Total CHOL/HDL Ratio: 3.5 (calc) (ref <5.0)
Triglycerides: 68 mg/dL (ref <150)

## 2020-03-06 ENCOUNTER — Ambulatory Visit (HOSPITAL_COMMUNITY)
Admission: RE | Admit: 2020-03-06 | Discharge: 2020-03-06 | Disposition: A | Discharge status: Home / Self Care | Payer: Medicare PPO | Source: Ambulatory Visit | Attending: Family Medicine | Admitting: Family Medicine

## 2020-03-06 ENCOUNTER — Other Ambulatory Visit: Payer: Self-pay

## 2020-03-06 DIAGNOSIS — M810 Age-related osteoporosis without current pathological fracture: Secondary | ICD-10-CM | POA: Diagnosis not present

## 2020-03-08 ENCOUNTER — Telehealth: Payer: Self-pay | Admitting: *Deleted

## 2020-03-08 NOTE — Telephone Encounter (Signed)
Submitted benefit verification for Prolia  

## 2020-03-08 NOTE — Telephone Encounter (Signed)
-----   Message from [place], LPN sent at [phone]  2:15 PM EST ----- Regarding: [place]

## 2020-03-13 NOTE — Telephone Encounter (Signed)
Noted  

## 2020-03-13 NOTE — Telephone Encounter (Signed)
Benefit verification completed. PA required.   PA submitted via CoverMyMeds.   Received PA determination. PA approved through 12/27/2020.  Call placed to patient and patient made aware. Advised to contact insurance in regards to co-pay.   Received return call from patient. Reports that co-pay for Prolia is $384. States that she does not want to proceed with injection at this time. Reports that she will increase her calcium and vitamin d in her diet.   MD to be made aware.

## 2020-04-03 DIAGNOSIS — H524 Presbyopia: Secondary | ICD-10-CM | POA: Diagnosis not present

## 2020-04-03 DIAGNOSIS — H4053X2 Glaucoma secondary to other eye disorders, bilateral, moderate stage: Secondary | ICD-10-CM | POA: Diagnosis not present

## 2020-04-03 DIAGNOSIS — H401112 Primary open-angle glaucoma, right eye, moderate stage: Secondary | ICD-10-CM | POA: Diagnosis not present

## 2020-05-03 DIAGNOSIS — H401132 Primary open-angle glaucoma, bilateral, moderate stage: Secondary | ICD-10-CM | POA: Diagnosis not present

## 2020-05-29 ENCOUNTER — Other Ambulatory Visit: Payer: Self-pay | Admitting: Student

## 2020-05-29 DIAGNOSIS — Z8669 Personal history of other diseases of the nervous system and sense organs: Secondary | ICD-10-CM | POA: Diagnosis not present

## 2020-05-29 DIAGNOSIS — H401124 Primary open-angle glaucoma, left eye, indeterminate stage: Secondary | ICD-10-CM | POA: Diagnosis not present

## 2020-05-29 DIAGNOSIS — H35353 Cystoid macular degeneration, bilateral: Secondary | ICD-10-CM | POA: Diagnosis not present

## 2020-05-29 DIAGNOSIS — H35373 Puckering of macula, bilateral: Secondary | ICD-10-CM | POA: Diagnosis not present

## 2020-05-29 DIAGNOSIS — H401112 Primary open-angle glaucoma, right eye, moderate stage: Secondary | ICD-10-CM | POA: Diagnosis not present

## 2020-05-29 DIAGNOSIS — Z9889 Other specified postprocedural states: Secondary | ICD-10-CM | POA: Diagnosis not present

## 2020-06-18 DIAGNOSIS — H35373 Puckering of macula, bilateral: Secondary | ICD-10-CM | POA: Diagnosis not present

## 2020-06-18 DIAGNOSIS — H31092 Other chorioretinal scars, left eye: Secondary | ICD-10-CM | POA: Diagnosis not present

## 2020-06-18 DIAGNOSIS — H35341 Macular cyst, hole, or pseudohole, right eye: Secondary | ICD-10-CM | POA: Diagnosis not present

## 2020-06-20 ENCOUNTER — Ambulatory Visit: Payer: Medicare Other | Admitting: Family Medicine

## 2020-06-25 ENCOUNTER — Ambulatory Visit: Payer: Medicare Other | Admitting: Family Medicine

## 2020-07-05 DIAGNOSIS — H35373 Puckering of macula, bilateral: Secondary | ICD-10-CM | POA: Diagnosis not present

## 2020-07-05 DIAGNOSIS — Z9889 Other specified postprocedural states: Secondary | ICD-10-CM | POA: Diagnosis not present

## 2020-07-05 DIAGNOSIS — H401112 Primary open-angle glaucoma, right eye, moderate stage: Secondary | ICD-10-CM | POA: Diagnosis not present

## 2020-07-05 DIAGNOSIS — H35353 Cystoid macular degeneration, bilateral: Secondary | ICD-10-CM | POA: Diagnosis not present

## 2020-07-05 DIAGNOSIS — H401124 Primary open-angle glaucoma, left eye, indeterminate stage: Secondary | ICD-10-CM | POA: Diagnosis not present

## 2020-07-05 DIAGNOSIS — Z8669 Personal history of other diseases of the nervous system and sense organs: Secondary | ICD-10-CM | POA: Diagnosis not present

## 2020-07-30 ENCOUNTER — Other Ambulatory Visit: Payer: Self-pay

## 2020-07-30 ENCOUNTER — Ambulatory Visit: Payer: Medicare PPO | Admitting: Internal Medicine

## 2020-07-30 ENCOUNTER — Telehealth: Payer: Self-pay | Admitting: Internal Medicine

## 2020-07-30 ENCOUNTER — Encounter: Payer: Self-pay | Admitting: Internal Medicine

## 2020-07-30 VITALS — BP 126/82 | HR 102 | Wt 118.0 lb

## 2020-07-30 DIAGNOSIS — I38 Endocarditis, valve unspecified: Secondary | ICD-10-CM | POA: Diagnosis not present

## 2020-07-30 DIAGNOSIS — I480 Paroxysmal atrial fibrillation: Secondary | ICD-10-CM | POA: Diagnosis not present

## 2020-07-30 MED ORDER — METOPROLOL SUCCINATE ER 50 MG PO TB24: 50.0000 mg | ORAL_TABLET | Freq: Every day | ORAL | 11 refills | Status: DC

## 2020-07-30 NOTE — Patient Instructions (Signed)
Medication Instructions:  Your physician has recommended you make the following change in your medication:  Increase Toprol XL to 50 mg every evening   *If you need a refill on your cardiac medications before your next appointment, please call your pharmacy*   Lab Work: NONE   If you have labs (blood work) drawn today and your tests are completely normal, you will receive your results only by:  [place] (if you have MyChart) OR  A paper copy in the mail If you have any lab test that is abnormal or we need to change your treatment, we will call you to review the results.   Testing/Procedures: NONE    Follow-Up: At [facility], you and your health needs are our priority.  As part of our continuing mission to provide you with exceptional heart care, we have created designated Provider Care Teams.  These Care Teams include your primary Cardiologist (physician) and Advanced Practice Providers (APPs -  Physician Assistants and Nurse Practitioners) who all work together to provide you with the care you need, when you need it.  We recommend signing up for the patient portal called "MyChart".  Sign up information is provided on this After Visit Summary.  MyChart is used to connect with patients for Virtual Visits (Telemedicine).  Patients are able to view lab/test results, encounter notes, upcoming appointments, etc.  Non-urgent messages can be sent to your provider as well.   To learn more about what you can do with MyChart, go to [email or web address].    Your next appointment:   5-6 week(s)  The format for your next appointment:   In Person  Provider:   Cristopher Peru, MD   Other Instructions Thank you for choosing [facility]!

## 2020-07-30 NOTE — Progress Notes (Signed)
HPI Kelly Dougherty returns today for followup. She is a pleasant 79 yo woman with a h/o atrial flutter and atrial fib. She has had a flutter ablation and then was continued on flecainide. She has had recurrent atrial fib/flutter. She feels palpitations and at night when she lies down feels like her heart is beating fast. No edema and no chest pain. No syncope. No Known Allergies   Current Outpatient Medications  Medication Sig Dispense Refill  . apixaban (ELIQUIS) 5 MG TABS tablet Take 1 tablet (5 mg total) by mouth 2 (two) times daily. 180 tablet 3  . calcium-vitamin D (OSCAL WITH D) 500-200 MG-UNIT tablet Take 1 tablet by mouth 2 (two) times daily.     [name] Calcium (STOOL SOFTENER PO) Take 100 mg by mouth daily.     . flecainide (TAMBOCOR) 50 MG tablet TAKE 1 TABLET BY MOUTH EVERY 12 HOURS 60 tablet 5  . furosemide (LASIX) 20 MG tablet TAKE 1 TABLET BY MOUTH DAILY AS NEEDED FOR SWELLING (Patient taking differently: Take 20 mg by mouth daily as needed for edema. ) 90 tablet 3  . latanoprost (XALATAN) 0.005 % ophthalmic solution Place 1 drop into both eyes at bedtime.    . metoprolol tartrate (LOPRESSOR) 25 MG tablet Take 1 tablet in the AM and 1/2 tablet in the PM 135 tablet 3  . potassium chloride (K-DUR,KLOR-CON) 10 MEQ tablet TAKE 1 TABLET BY MOUTH ONLY ON DAYS YOUR TAKING LASIX FOR SWELLING (Patient taking differently: Take 10 mEq by mouth daily as needed. TAKE 10 meqTABLET BY MOUTH ONLY ON DAYS YOUR TAKING LASIX FOR SWELLING) 90 tablet 3  . timolol (BETIMOL) 0.5 % ophthalmic solution Place 1 drop into the left eye 2 (two) times daily.    . timolol (TIMOPTIC) 0.5 % ophthalmic solution Place 1 drop into the left eye 2 (two) times daily.     No current facility-administered medications for this visit.     Past Medical History:  Diagnosis Date  . Arthritis   . Atrial fibrillation ([place])   . Cataract   . Dysrhythmia    04/2015 with atrial fibrillation  . Glaucoma   .  History of echocardiogram    Echo 1/19: EF 60-65, no RWMA, normal diastolic function, mild to mod AI, MAC, mild to mod MR, mild BAE, mod TR, PASP 40    ROS:   All systems reviewed and negative except as noted in the HPI.   Past Surgical History:  Procedure Laterality Date  . A-FLUTTER ABLATION N/A 10/02/2019   Procedure: A-FLUTTER ABLATION;  Surgeon: Evans Lance, MD;  Location: [place] [facility];  Service: Cardiovascular;  Laterality: N/A;  . APPENDECTOMY     79 years of age  . CARDIOVERSION N/A 10/03/2015   Procedure: CARDIOVERSION;  Surgeon: Herminio Commons, MD;  Location: [facility];  Service: Cardiovascular;  Laterality: N/A;  . COLONOSCOPY N/A 07/30/2016   Procedure: COLONOSCOPY;  Surgeon: Rogene Houston, MD;  [facility];  Service: Endoscopy;  Laterality: N/A;  8:30  . EYE SURGERY     left  . HEMORROIDECTOMY    . TONSILLECTOMY     childhood     Family History  Problem Relation Age of Onset  . Hearing loss Mother   . Alzheimer's disease Father      Social History   Socioeconomic History  . Marital status: Married    Spouse name: Not on file  . Number of children: Not  on file  . Years of education: Not on file  . Highest education level: Not on file  Occupational History  . Not on file  Tobacco Use  . Smoking status: Never Smoker  . Smokeless tobacco: Never Used  Vaping Use  . Vaping Use: Never used  Substance and Sexual Activity  . Alcohol use: Yes    Alcohol/week: 7.0 standard drinks    Types: 7 Glasses of wine per week    Comment: one glass of wine with dinner each night  . Drug use: No  . Sexual activity: Not Currently  Other Topics Concern  . Not on file  Social History Narrative  . Not on file   Social Determinants of Health   Financial Resource Strain:   . Difficulty of Paying Living Expenses:   Food Insecurity:   . Worried About Charity fundraiser in the Last Year:   . Arboriculturist in the Last Year:   Transportation  Needs:   . Film/video editor (Medical):   Marland Kitchen Lack of Transportation (Non-Medical):   Physical Activity:   . Days of Exercise per Week:   . Minutes of Exercise per Session:   Stress:   . Feeling of Stress :   Social Connections:   . Frequency of Communication with Friends and Family:   . Frequency of Social Gatherings with Friends and Family:   . Attends Religious Services:   . Active Member of Clubs or Organizations:   . Attends Archivist Meetings:   Marland Kitchen Marital Status:   Intimate Partner Violence:   . Fear of Current or Ex-Partner:   . Emotionally Abused:   Marland Kitchen Physically Abused:   . Sexually Abused:      BP 126/82   Pulse (!) 102   Wt 118 lb (53.5 kg)   SpO2 100%   BMI 19.64 kg/m   Physical Exam:  Well appearing NAD HEENT: Unremarkable Neck:  No JVD, no thyromegally Lymphatics:  No adenopathy Back:  No CVA tenderness Lungs:  Clear with no wheezes HEART:  Regular rate rhythm, no murmurs, no rubs, no clicks Abd:  soft, positive bowel sounds, no organomegally, no rebound, no guarding Ext:  2 plus pulses, no edema, no cyanosis, no clubbing Skin:  No rashes no nodules Neuro:  CN II through XII intact, motor grossly intact  EKG - atrial fib/flutter with a RVR   Assess/Plan: 1. Uncontrolled atrial fib/flutter - I have discussed her treatment options including uptitration of her beta blocker, AV node ablation and PPM, amio and dofetilide. She will uptitrate her beta blocker and I will see her back in 5-6 weeks.  2. coags - she has had no bleeding on eliquis. 3. HTN her bp is up a bit. We will uptitrate her beta blocker 4. Weight loss - her weight is up 6 lbs. I have encouraged her to gain weight.

## 2020-07-30 NOTE — Telephone Encounter (Signed)
Pt c/o medication issue:  1. Name of Medication: flecainide (TAMBOCOR) 50 MG tablet  2. How are you currently taking this medication (dosage and times per day)? 50 mg 2x daily   3. Are you having a reaction (difficulty breathing--STAT)? No   4. What is your medication issue? Pt wanted to know if she still needs to be taking this medication or not. She did not see any changes on her printout from her appointment. Please advise

## 2020-07-30 NOTE — Progress Notes (Signed)
EKG

## 2020-07-30 NOTE — Telephone Encounter (Signed)
I left a message for Kelly Dougherty that she should take Flecainide 50 mg twice a day and to call back if she has more questions.

## 2020-07-30 NOTE — Addendum Note (Signed)
Addended by: Levonne Hubert on: 07/30/2020 02:43 PM   Modules accepted: Orders

## 2020-07-31 DIAGNOSIS — Z9889 Other specified postprocedural states: Secondary | ICD-10-CM | POA: Diagnosis not present

## 2020-07-31 DIAGNOSIS — H35353 Cystoid macular degeneration, bilateral: Secondary | ICD-10-CM | POA: Diagnosis not present

## 2020-07-31 DIAGNOSIS — Z8669 Personal history of other diseases of the nervous system and sense organs: Secondary | ICD-10-CM | POA: Diagnosis not present

## 2020-07-31 DIAGNOSIS — H35373 Puckering of macula, bilateral: Secondary | ICD-10-CM | POA: Diagnosis not present

## 2020-07-31 DIAGNOSIS — H401112 Primary open-angle glaucoma, right eye, moderate stage: Secondary | ICD-10-CM | POA: Diagnosis not present

## 2020-07-31 DIAGNOSIS — H401124 Primary open-angle glaucoma, left eye, indeterminate stage: Secondary | ICD-10-CM | POA: Diagnosis not present

## 2020-09-09 ENCOUNTER — Other Ambulatory Visit: Payer: Self-pay

## 2020-09-09 MED ORDER — APIXABAN 5 MG PO TABS: 5.0000 mg | ORAL_TABLET | Freq: Two times a day (BID) | ORAL | 3 refills | Status: DC

## 2020-09-09 NOTE — Telephone Encounter (Signed)
Refilled eliquis 5 mg bid #[phone]

## 2020-09-10 DIAGNOSIS — H401124 Primary open-angle glaucoma, left eye, indeterminate stage: Secondary | ICD-10-CM | POA: Diagnosis not present

## 2020-09-10 DIAGNOSIS — H35373 Puckering of macula, bilateral: Secondary | ICD-10-CM | POA: Diagnosis not present

## 2020-09-10 DIAGNOSIS — Z9889 Other specified postprocedural states: Secondary | ICD-10-CM | POA: Diagnosis not present

## 2020-09-10 DIAGNOSIS — H401112 Primary open-angle glaucoma, right eye, moderate stage: Secondary | ICD-10-CM | POA: Diagnosis not present

## 2020-09-10 DIAGNOSIS — H35353 Cystoid macular degeneration, bilateral: Secondary | ICD-10-CM | POA: Diagnosis not present

## 2020-09-10 DIAGNOSIS — Z8669 Personal history of other diseases of the nervous system and sense organs: Secondary | ICD-10-CM | POA: Diagnosis not present

## 2020-10-08 DIAGNOSIS — H35341 Macular cyst, hole, or pseudohole, right eye: Secondary | ICD-10-CM | POA: Diagnosis not present

## 2020-10-08 DIAGNOSIS — H35373 Puckering of macula, bilateral: Secondary | ICD-10-CM | POA: Diagnosis not present

## 2020-10-08 DIAGNOSIS — H31092 Other chorioretinal scars, left eye: Secondary | ICD-10-CM | POA: Diagnosis not present

## 2020-11-26 ENCOUNTER — Other Ambulatory Visit: Payer: Self-pay

## 2020-11-26 MED ORDER — FLECAINIDE ACETATE 50 MG PO TABS: 50.0000 mg | ORAL_TABLET | Freq: Two times a day (BID) | ORAL | 11 refills | Status: DC

## 2020-11-26 NOTE — Telephone Encounter (Signed)
Refilled flecainide 50 mg bid to [facility]

## 2020-12-03 DIAGNOSIS — H31092 Other chorioretinal scars, left eye: Secondary | ICD-10-CM | POA: Diagnosis not present

## 2020-12-03 DIAGNOSIS — H35341 Macular cyst, hole, or pseudohole, right eye: Secondary | ICD-10-CM | POA: Diagnosis not present

## 2020-12-03 DIAGNOSIS — H35373 Puckering of macula, bilateral: Secondary | ICD-10-CM | POA: Diagnosis not present

## 2020-12-06 ENCOUNTER — Emergency Department (HOSPITAL_COMMUNITY): Payer: Medicare PPO

## 2020-12-06 ENCOUNTER — Encounter (HOSPITAL_COMMUNITY): Payer: Self-pay | Admitting: *Deleted

## 2020-12-06 ENCOUNTER — Emergency Department (HOSPITAL_COMMUNITY)
Admission: EM | Admit: 2020-12-06 | Discharge: 2020-12-06 | Disposition: A | Discharge status: Home / Self Care | Payer: Medicare PPO | Attending: Emergency Medicine | Admitting: Emergency Medicine

## 2020-12-06 ENCOUNTER — Other Ambulatory Visit: Payer: Self-pay

## 2020-12-06 DIAGNOSIS — R220 Localized swelling, mass and lump, head: Secondary | ICD-10-CM | POA: Insufficient documentation

## 2020-12-06 DIAGNOSIS — J40 Bronchitis, not specified as acute or chronic: Secondary | ICD-10-CM | POA: Diagnosis not present

## 2020-12-06 DIAGNOSIS — S42035A Nondisplaced fracture of lateral end of left clavicle, initial encounter for closed fracture: Secondary | ICD-10-CM | POA: Insufficient documentation

## 2020-12-06 DIAGNOSIS — W19XXXA Unspecified fall, initial encounter: Secondary | ICD-10-CM

## 2020-12-06 DIAGNOSIS — Y92512 Supermarket, store or market as the place of occurrence of the external cause: Secondary | ICD-10-CM | POA: Diagnosis not present

## 2020-12-06 DIAGNOSIS — W0110XA Fall on same level from slipping, tripping and stumbling with subsequent striking against unspecified object, initial encounter: Secondary | ICD-10-CM | POA: Insufficient documentation

## 2020-12-06 DIAGNOSIS — R2689 Other abnormalities of gait and mobility: Secondary | ICD-10-CM | POA: Diagnosis not present

## 2020-12-06 DIAGNOSIS — I4891 Unspecified atrial fibrillation: Secondary | ICD-10-CM | POA: Diagnosis not present

## 2020-12-06 DIAGNOSIS — R269 Unspecified abnormalities of gait and mobility: Secondary | ICD-10-CM | POA: Diagnosis not present

## 2020-12-06 DIAGNOSIS — R262 Difficulty in walking, not elsewhere classified: Secondary | ICD-10-CM

## 2020-12-06 DIAGNOSIS — Z7901 Long term (current) use of anticoagulants: Secondary | ICD-10-CM | POA: Diagnosis not present

## 2020-12-06 DIAGNOSIS — S2232XA Fracture of one rib, left side, initial encounter for closed fracture: Secondary | ICD-10-CM | POA: Insufficient documentation

## 2020-12-06 DIAGNOSIS — S4992XA Unspecified injury of left shoulder and upper arm, initial encounter: Secondary | ICD-10-CM | POA: Diagnosis present

## 2020-12-06 DIAGNOSIS — M6281 Muscle weakness (generalized): Secondary | ICD-10-CM | POA: Diagnosis not present

## 2020-12-06 DIAGNOSIS — S0990XA Unspecified injury of head, initial encounter: Secondary | ICD-10-CM | POA: Diagnosis not present

## 2020-12-06 MED ORDER — ACETAMINOPHEN 500 MG PO TABS: 500.0000 mg | ORAL_TABLET | Freq: Four times a day (QID) | ORAL | 0 refills | Status: DC | PRN

## 2020-12-06 MED ORDER — OXYCODONE-ACETAMINOPHEN 5-325 MG PO TABS
1.0000 | ORAL_TABLET | Freq: Once | ORAL | Status: AC
  Administered 2020-12-06: 1 via ORAL
  Filled 2020-12-06: qty 1

## 2020-12-06 MED ORDER — OXYCODONE-ACETAMINOPHEN 5-325 MG PO TABS: 1.0000 | ORAL_TABLET | Freq: Four times a day (QID) | ORAL | 0 refills | Status: DC | PRN

## 2020-12-06 NOTE — Discharge Instructions (Addendum)
I would like you to follow-up with her primary care provider regarding today's encounter.  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  I suspect that is why you have had multiple falls recently.  I would like for you to use your walker to help with your balance and gait disturbance.  I referred you to Dr. Lucia Gaskins, orthopedics, for ongoing evaluation of your multiple fractures from your fall today.  There is a nondisplaced fracture of your clavicle (collarbone).  There is also a fracture involving your third rib and the possible fracture of your coracoid bone.  Please follow-up with orthopedics for ongoing evaluation.  I would like you to continue wearing a sling for comfort.  Please take the medications, as directed.  The narcotics that I have given you can make you very drowsy and at greater risk for falls.  I would also like for you to use your incentive spirometer to help mitigate risk of complications from your rib injury.  Return to the ED or seek immediate medical attention should you experience any new or worsening symptoms.  You were given narcotic and or sedative medications while in the emergency department. Do not drive. Do not use machinery or power tools. Do not sign legal documents. Do not drink alcohol. Do not take sleeping pills. Do not supervise children by yourself. Do not participate in activities that require climbing or being in high places.

## 2020-12-06 NOTE — ED Notes (Signed)
Dc by Bedelia Person

## 2020-12-06 NOTE — ED Notes (Signed)
Pt in bed, pt states that she lost her balance when moving her purse from one arm to the other, pt states that she fell and hurt her L scapula, denies pain with palpation of arm

## 2020-12-06 NOTE — ED Triage Notes (Signed)
Fell at [place] in [place], pain in left shoulder, states she hit her head and is on blood thinner

## 2020-12-06 NOTE — Clinical Social Work Note (Signed)
Transition of Care [facility]) - Emergency Department Mini Assessment  Patient Details  Name: Kelly Dougherty MRN: [id number] Date of Birth: 29-Nov-1941  Transition of [facility]) CM/SW Contact:    Sherie Don, LCSW Phone Number: 12/06/2020, 3:16 PM  Clinical Narrative: Patient is an 79 year old female who presented to the ED following a fall. TOC received consult for DME. CSW spoke with patient's husband, Kelly Dougherty. Husband states patient will need a rolling walker and she does not have one at home. Husband agreeable to DME referral to [employer]. Referral made to [facility] with [facility] and he will deliver walker to patient's room. TOC signing off.  ED Mini Assessment: What brought you to the Emergency Department? : Fall Barriers to Discharge: ED DME delivery,ED Barriers Resolved Barrier interventions: Rolling walker ordered from [place] of departure: Car Interventions which prevented an admission or readmission: DME Provided  Patient Contact and Communications Key Contact 1: [employer] Spoke with: Laverle Hobby Date: 12/06/20     Call outcome: Gilford Rile ordered and to be delivered to patient's room Patient states their goals for this hospitalization and ongoing recovery are:: Return home CMS Medicare.gov Compare Post Acute Care list provided to:: Patient Represenative (must comment) Kelly Dougherty (husband)) Choice offered to / list presented to : Spouse  Admission diagnosis:  fall Patient Active Problem List   Diagnosis Date Noted  . Dermatitis 12/26/2019  . Unspecified atrial flutter ([place]) 09/21/2019  . Osteoporosis 01/09/2019  . Full code status 01/09/2019  . Heart murmur 01/12/2018  . Mild hyperlipidemia 01/04/2017  . 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 Glaucoma   . Chronic atrial fibrillation ([place])   . DOE (dyspnea on exertion)   . Cardiomyopathy ([place]) 08/06/2015   PCP:  Alycia Rossetti, MD Pharmacy:   [place], [place] - [address] [place] [place] [address] Phone: [phone] Fax: [phone]

## 2020-12-06 NOTE — ED Provider Notes (Signed)
[facility] EMERGENCY DEPARTMENT Provider Note   CSN: [id number] Arrival date & time: 12/06/20  1125     History Chief Complaint  Patient presents with  . Fall    Kelly Dougherty is a 79 y.o. female with PMH significant for atrial fibrillation on Eliquis 5 mg twice daily who presents the ED after sustaining mechanical fall.  On my examination, patient reports that she was standing in line at the [employer] and was throwing her purse from one shoulder to the other when she lost her balance and fell onto her back.  She states that she struck the back of her head, but her primary complaint is her left scapular region pain.  She states that she is unable to raise her left arm due to her pain symptoms.  She denies any dizziness, blurred vision, headache, presyncopal prodrome, LOC, chest pain, difficulty breathing, numbness, or other focal neurologic deficits.  She states that she has been taking her Eliquis, as directed.  Patient tells me that she has fallen many times in the past, but never felt as though she needed to come to the ED for evaluation.  She states that she often feels off balance and unsteady on her feet.  HPI     Past Medical History:  Diagnosis Date  . Arthritis   . Atrial fibrillation ([place])   . Cataract   . Dysrhythmia    04/2015 with atrial fibrillation  . Glaucoma   . History of echocardiogram    Echo 1/19: EF 60-65, no RWMA, normal diastolic function, mild to mod AI, MAC, mild to mod MR, mild BAE, mod TR, PASP 40    Patient Active Problem List   Diagnosis Date Noted  . Dermatitis 12/26/2019  . Unspecified atrial flutter ([place]) 09/21/2019  . Osteoporosis 01/09/2019  . Full code status 01/09/2019  . Heart murmur 01/12/2018  . Mild hyperlipidemia 01/04/2017  . 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 Glaucoma   . Chronic atrial fibrillation ([place])   . DOE (dyspnea on exertion)   . Cardiomyopathy ([place]) 08/06/2015    Past Surgical History:   Procedure Laterality Date  . A-FLUTTER ABLATION N/A 10/02/2019   Procedure: A-FLUTTER ABLATION;  Surgeon: Evans Lance, MD;  Location: [place] [facility];  Service: Cardiovascular;  Laterality: N/A;  . APPENDECTOMY     79 years of age  . CARDIOVERSION N/A 10/03/2015   Procedure: CARDIOVERSION;  Surgeon: Herminio Commons, MD;  Location: [facility];  Service: Cardiovascular;  Laterality: N/A;  . COLONOSCOPY N/A 07/30/2016   Procedure: COLONOSCOPY;  Surgeon: Rogene Houston, MD;  [facility];  Service: Endoscopy;  Laterality: N/A;  8:30  . EYE SURGERY     left  . HEMORROIDECTOMY    . TONSILLECTOMY     childhood     OB History   No obstetric history on file.     Family History  Problem Relation Age of Onset  . Hearing loss Mother   . Alzheimer's disease Father     Social History   Tobacco Use  . Smoking status: Never Smoker  . Smokeless tobacco: Never Used  Vaping Use  . Vaping Use: Never used  Substance Use Topics  . Alcohol use: Yes    Alcohol/week: 7.0 standard drinks    Types: 7 Glasses of wine per week    Comment: one glass of wine with dinner each night  . Drug use: No    Home Medications Prior to  Admission medications   Medication Sig Start Date End Date Taking? Authorizing Provider  acetaminophen (TYLENOL) 500 MG tablet Take 1 tablet (500 mg total) by mouth every 6 (six) hours as needed. 12/06/20   Corena Herter, PA-C  apixaban (ELIQUIS) 5 MG TABS tablet Take 1 tablet (5 mg total) by mouth 2 (two) times daily. 09/09/20   Strader, Fransisco Hertz, PA-C  calcium-vitamin D (OSCAL WITH D) 500-200 MG-UNIT tablet Take 1 tablet by mouth 2 (two) times daily.     [provider]  Docusate Calcium (STOOL SOFTENER PO) Take 100 mg by mouth daily.     [provider]  flecainide (TAMBOCOR) 50 MG tablet Take 1 tablet (50 mg total) by mouth every 12 (twelve) hours. 11/26/20   Evans Lance, MD  furosemide (LASIX) 20 MG tablet TAKE 1 TABLET BY MOUTH  DAILY AS NEEDED FOR SWELLING Patient taking differently: Take 20 mg by mouth daily as needed for edema.  03/03/19   Herminio Commons, MD  latanoprost (XALATAN) 0.005 % ophthalmic solution Place 1 drop into both eyes at bedtime.    [provider]  metoprolol succinate (TOPROL XL) 50 MG 24 hr tablet Take 1 tablet (50 mg total) by mouth daily after supper. 07/30/20   Evans Lance, MD  oxyCODONE-acetaminophen (PERCOCET/ROXICET) 5-325 MG tablet Take 1 tablet by mouth every 6 (six) hours as needed for severe pain. 12/06/20   Corena Herter, PA-C  potassium chloride (K-DUR,KLOR-CON) 10 MEQ tablet TAKE 1 TABLET BY MOUTH ONLY ON DAYS YOUR TAKING LASIX FOR SWELLING Patient taking differently: Take 10 mEq by mouth daily as needed. TAKE 10 meqTABLET BY MOUTH ONLY ON DAYS YOUR TAKING LASIX FOR SWELLING 03/03/19   Herminio Commons, MD  timolol (BETIMOL) 0.5 % ophthalmic solution Place 1 drop into the left eye 2 (two) times daily.    [provider]  timolol (TIMOPTIC) 0.5 % ophthalmic solution Place 1 drop into the left eye 2 (two) times daily. 10/25/19   [provider]    Allergies    Patient has no known allergies.  Review of Systems   Review of Systems  All other systems reviewed and are negative.   Physical Exam Updated Vital Signs BP (!) 132/97 (BP Location: Right Arm)   Pulse 71   Temp 98.3 F (36.8 C) (Oral)   Resp 18   Ht _0  (1.651 m)   Wt 52.2 kg   SpO2 100%   BMI 19.14 kg/m   Physical Exam Vitals and nursing note reviewed. Exam conducted with a chaperone present.  HENT:     Head: Normocephalic.     Comments: Very mild swelling over posterior scalp.  No overlying skin changes.  No palpable skull defects. Eyes:     General: No scleral icterus.    Conjunctiva/sclera: Conjunctivae normal.  Neck:     Comments: No midline cervical tenderness to palpation.  ROM fully intact. Cardiovascular:     Rate and Rhythm: Normal rate.     Pulses: Normal  pulses.  Pulmonary:     Effort: Pulmonary effort is normal. No respiratory distress.     Breath sounds: Normal breath sounds.     Comments: Breath sounds intact bilaterally. Abdominal:     General: Abdomen is flat. There is no distension.     Palpations: Abdomen is soft.     Tenderness: There is no abdominal tenderness.  Musculoskeletal:     Cervical back: Normal range of motion and neck supple.  No rigidity.     Comments: Significant TTP over left scapular region.  Moderate TTP over left shoulder.  ROM left arm limited due to pain.  No significant swelling or overlying skin changes.  Peripheral pulses intact and symmetric.  Sensation intact throughout.  Skin:    General: Skin is dry.  Neurological:     General: No focal deficit present.     Mental Status: She is alert and oriented to person, place, and time.     GCS: GCS eye subscore is 4. GCS verbal subscore is 5. GCS motor subscore is 6.     Sensory: No sensory deficit.  Psychiatric:        Mood and Affect: Mood normal.        Behavior: Behavior normal.        Thought Content: Thought content normal.     ED Results / Procedures / Treatments   Labs (all labs ordered are listed, but only abnormal results are displayed) Labs Reviewed - No data to display  EKG None  Radiology DG Ribs Unilateral W/Chest Left  Result Date: 12/06/2020 CLINICAL DATA:  [place] this morning, posterior LEFT shoulder and rib pain EXAM: LEFT RIBS AND CHEST - 3+ VIEW COMPARISON:  Chest radiograph 09/09/2015 FINDINGS: Enlargement of cardiac silhouette. Mediastinal contours and pulmonary vascularity normal. Atherosclerotic calcification aorta. Bronchitic changes without infiltrate, pleural effusion or pneumothorax. Diffuse osseous demineralization. Distal LEFT clavicular fracture seen on shoulder radiographs poorly visualized on chest radiograph. Osseous demineralization. Mildly displaced fracture of the lateral LEFT third rib. Questionable fracture of the  lateral LEFT fourth rib. IMPRESSION: Fractures of the LEFT lateral third and questionably fourth ribs. Enlargement of cardiac silhouette. Bronchitic changes. Electronically Signed   By: Lavonia Dana M.D.   On: 12/06/2020 13:50   CT Head Wo Contrast  Result Date: 12/06/2020 CLINICAL DATA:  Fall. EXAM: CT HEAD WITHOUT CONTRAST TECHNIQUE: Contiguous axial images were obtained from the base of the skull through the vertex without intravenous contrast. COMPARISON:  None. FINDINGS: Brain: No evidence of acute large vascular territory infarction, hemorrhage, hydrocephalus, extra-axial collection or mass lesion/mass effect. Remote bilateral cerebellar lacunar infarcts. Patchy white matter hypoattenuation, most likely related to chronic microvascular ischemic disease. Generalized cerebral volume loss with ex vacuo ventricular dilation. Vascular: Calcific atherosclerosis. Skull: No acute fracture. Sinuses/Orbits: Visualized sinuses are clear.  Unremarkable orbits. Other: No mastoid effusions. IMPRESSION: 1. No evidence of acute intracranial abnormality. 2. Remote cerebellar lacunar infarcts, chronic microvascular ischemic disease, and generalized cerebral atrophy. Electronically Signed   By: Margaretha Sheffield MD   On: 12/06/2020 14:24   DG Shoulder Left  Addendum Date: 12/06/2020   ADDENDUM REPORT: 12/06/2020 15:14 ADDENDUM: Reviewed images with Krista Blue. Questionable fracture at base of coracoid process seen on single view, not identified on remaining views; recommend correlation for pain/tenderness at this site. Electronically Signed   By: Lavonia Dana M.D.   On: 12/06/2020 15:14   Result Date: 12/06/2020 CLINICAL DATA:  [place] this morning, posterior LEFT shoulder and rib pain EXAM: LEFT SHOULDER - 2+ VIEW COMPARISON:  None FINDINGS: Osseous demineralization. AC joint alignment normal. Nondisplaced fracture of the distal LEFT clavicle. LEFT glenohumeral degenerative changes. No glenohumeral fracture or  dislocation. Additional mildly displaced displaced fracture of the lateral LEFT third rib. IMPRESSION: Nondisplaced fracture distal LEFT clavicle. Mildly displaced fracture of lateral LEFT third rib. Electronically Signed: By: Lavonia Dana M.D. On: 12/06/2020 13:48    Procedures Procedures (including critical care time)  Medications Ordered in ED Medications  oxyCODONE-acetaminophen (PERCOCET/ROXICET) 5-325 MG per tablet 1 tablet (1 tablet Oral Given 12/06/20 1342)    ED Course  I have reviewed the triage vital signs and the nursing notes.  Pertinent labs & imaging results that were available during my care of the patient were reviewed by me and considered in my medical decision making (see chart for details).    MDM Rules/Calculators/A&P                          Given patient's history of ambulatory dysfunction and frequent falls in conjunction with her striking the back of her head while on anticoagulation, CT obtained of head without contrast.  Do not feel as though cervical spine CT warranted given no midline tenderness and full ROM.  She denies any neck pain.  CT of the head was without any acute intracranial abnormalities, but did reveal multiple remote lacunar cerebellar infarcts.  This likely is contributing to her subacute/chronic ambulatory dysfunction and frequent falls.  I have placed a referral to transition of care to arrange for assisted walking devices to help with her amatory dysfunction.  She assures me that she and her husband will follow up with her primary care provider regarding these findings and for ongoing evaluation and management.  Her primary complaint today however was her left-sided scapular region and left shoulder discomfort.  Plain films were initially ordered for scapula, but change to shoulder given that they were scapular views included.  DG shoulder is personally reviewed and demonstrates a nondisplaced fracture involving the distal left clavicle as well as a  mildly displaced lateral left rib fracture.  Spoke with the interpreting radiologist, Dr. Darcella Cheshire, who feels as though a dedicated study of scapula not necessary.  The left third rib fracture is lateral with posterior lateral plane may be contributing to her tenderness in scapular region.  There is also questionable fracture involving the fourth rib, nondisplaced.  Will provide patient with a sling and encourage her to use incentive spirometer at home.  Will refer her to Dr. Lucia Gaskins, orthopedics, for ongoing evaluation management.  I then spoke with Dr. Thornton Papas again who states that he actually also sees a possible fracture involving coracoid process.    ED return precautions discussed.  Patient voices understanding and is agreeable to the plan.  Final Clinical Impression(s) / ED Diagnoses Final diagnoses:  Fall, initial encounter  Closed nondisplaced fracture of acromial end of left clavicle, initial encounter  Closed fracture of one rib of left side, initial encounter  Ambulatory dysfunction    Rx / DC Orders ED Discharge Orders         Ordered    oxyCODONE-acetaminophen (PERCOCET/ROXICET) 5-325 MG tablet  Every 6 hours PRN        12/06/20 1536    acetaminophen (TYLENOL) 500 MG tablet  Every 6 hours PRN        12/06/20 1536    Incentive spirometry RT        12/06/20 1536           Reita Chard 12/06/20 1538    Luna Fuse, MD 12/16/20 [phone]

## 2021-01-01 ENCOUNTER — Other Ambulatory Visit: Payer: Self-pay

## 2021-01-01 ENCOUNTER — Encounter: Payer: Self-pay | Admitting: Internal Medicine

## 2021-01-01 ENCOUNTER — Ambulatory Visit: Payer: Medicare PPO | Admitting: Internal Medicine

## 2021-01-01 VITALS — BP 128/76 | HR 127 | Ht 65.0 in | Wt 123.0 lb

## 2021-01-01 DIAGNOSIS — I480 Paroxysmal atrial fibrillation: Secondary | ICD-10-CM | POA: Diagnosis not present

## 2021-01-01 MED ORDER — DIGOXIN 125 MCG PO TABS: 0.1250 mg | ORAL_TABLET | ORAL | 3 refills | Status: DC

## 2021-01-01 NOTE — Progress Notes (Signed)
HPI Kelly Dougherty returns today for followup. She is a pleasant 80 yo woman with a h/o atrial flutter and atrial fib. She has had a flutter ablation and then was continued on flecainide. She has had recurrent atrial fib/flutter. She feels palpitations and at night when she lies down feels like her heart is beating fast. No edema and no chest pain. No syncope. When I saw her 6 months ago, we uptitrated her toprol to 50 mg daily. She has been stable though I suspect that her rates are not well controlled.  No Known Allergies   Current Outpatient Medications  Medication Sig Dispense Refill  . acetaminophen (TYLENOL) 500 MG tablet Take 1 tablet (500 mg total) by mouth every 6 (six) hours as needed. 30 tablet 0  . apixaban (ELIQUIS) 5 MG TABS tablet Take 1 tablet (5 mg total) by mouth 2 (two) times daily. 180 tablet 3  . calcium-vitamin D (OSCAL WITH D) 500-200 MG-UNIT tablet Take 1 tablet by mouth 2 (two) times daily.     [name] Calcium (STOOL SOFTENER PO) Take 100 mg by mouth daily.     . dorzolamide (TRUSOPT) 2 % ophthalmic solution Apply to eye.    . flecainide (TAMBOCOR) 50 MG tablet Take 1 tablet (50 mg total) by mouth every 12 (twelve) hours. 60 tablet 11  . furosemide (LASIX) 20 MG tablet TAKE 1 TABLET BY MOUTH DAILY AS NEEDED FOR SWELLING (Patient taking differently: Take 20 mg by mouth daily as needed for edema.) 90 tablet 3  . latanoprost (XALATAN) 0.005 % ophthalmic solution Place 1 drop into both eyes at bedtime.    . metoprolol succinate (TOPROL XL) 50 MG 24 hr tablet Take 1 tablet (50 mg total) by mouth daily after supper. 30 tablet 11  . Netarsudil Dimesylate 0.02 % SOLN Apply to eye.    . potassium chloride (K-DUR,KLOR-CON) 10 MEQ tablet TAKE 1 TABLET BY MOUTH ONLY ON DAYS YOUR TAKING LASIX FOR SWELLING (Patient taking differently: Take 10 mEq by mouth daily as needed. TAKE 10 meqTABLET BY MOUTH ONLY ON DAYS YOUR TAKING LASIX FOR SWELLING) 90 tablet 3  . timolol (BETIMOL)  0.5 % ophthalmic solution Place 1 drop into the left eye 2 (two) times daily.    . timolol (TIMOPTIC) 0.5 % ophthalmic solution Place 1 drop into the left eye 2 (two) times daily.     No current facility-administered medications for this visit.     Past Medical History:  Diagnosis Date  . Arthritis   . Atrial fibrillation ([place])   . Cataract   . Dysrhythmia    04/2015 with atrial fibrillation  . Glaucoma   . History of echocardiogram    Echo 1/19: EF 60-65, no RWMA, normal diastolic function, mild to mod AI, MAC, mild to mod MR, mild BAE, mod TR, PASP 40    ROS:   All systems reviewed and negative except as noted in the HPI.   Past Surgical History:  Procedure Laterality Date  . A-FLUTTER ABLATION N/A 10/02/2019   Procedure: A-FLUTTER ABLATION;  Surgeon: Evans Lance, MD;  Location: [facility];  Service: Cardiovascular;  Laterality: N/A;  . APPENDECTOMY     80 years of age  . CARDIOVERSION N/A 10/03/2015   Procedure: CARDIOVERSION;  Surgeon: Herminio Commons, MD;  Location: [facility];  Service: Cardiovascular;  Laterality: N/A;  . COLONOSCOPY N/A 07/30/2016   Procedure: COLONOSCOPY;  Surgeon: Rogene Houston, MD;  [facility];  Service: Endoscopy;  Laterality: N/A;  8:30  . EYE SURGERY     left  . HEMORROIDECTOMY    . TONSILLECTOMY     childhood     Family History  Problem Relation Age of Onset  . Hearing loss Mother   . Alzheimer's disease Father      Social History   Socioeconomic History  . Marital status: Married    Spouse name: Not on file  . Number of children: Not on file  . Years of education: Not on file  . Highest education level: Not on file  Occupational History  . Not on file  Tobacco Use  . Smoking status: Never Smoker  . Smokeless tobacco: Never Used  Vaping Use  . Vaping Use: Never used  Substance and Sexual Activity  . Alcohol use: Yes    Alcohol/week: 7.0 standard drinks    Types: 7 Glasses of wine per week     Comment: one glass of wine with dinner each night  . Drug use: No  . Sexual activity: Not Currently  Other Topics Concern  . Not on file  Social History Narrative  . Not on file   Social Determinants of Health   Financial Resource Strain: Not on file  Food Insecurity: Not on file  Transportation Needs: Not on file  Physical Activity: Not on file  Stress: Not on file  Social Connections: Not on file  Intimate Partner Violence: Not on file     BP 128/76   Pulse (!) 127   Ht _0  (1.651 m)   Wt 123 lb (55.8 kg)   SpO2 97%   BMI 20.47 kg/m   Physical Exam:  Well appearing NAD HEENT: Unremarkable Neck:  No JVD, no thyromegally Lymphatics:  No adenopathy Back:  No CVA tenderness Lungs:  Clear with no wheezes HEART:  IRegular tachy rhythm, no murmurs, no rubs, no clicks Abd:  soft, positive bowel sounds, no organomegally, no rebound, no guarding Ext:  2 plus pulses, no edema, no cyanosis, no clubbing Skin:  No rashes no nodules Neuro:  CN II through XII intact, motor grossly intact  Assess/Plan: 1. Uncontrolled atrial fib/flutter - I have discussed her treatment options including uptitration of her beta blocker, and adding digoxin. She will start digoxin. 2. coags - she has had no bleeding on eliquis. 3. HTN her bp is well controlled. We will continue her beta blocker 4. Weight loss - her weight is unchanged.  I have encouraged her to gain weight.  Carleene Overlie Dalayla Aldredge,MD

## 2021-01-01 NOTE — Addendum Note (Signed)
Addended by: Kerney Elbe on: 01/01/2021 10:28 AM   Modules accepted: Orders

## 2021-01-01 NOTE — Patient Instructions (Signed)
Medication Instructions:  Your physician recommends that you continue on your current medications as directed. Please refer to the Current Medication list given to you today.  Start Taking Digoxin 0.125 mg Every Other Day   *If you need a refill on your cardiac medications before your next appointment, please call your pharmacy*   Lab Work: Your physician recommends that you return for lab work in: 1 Month   If you have labs (blood work) drawn today and your tests are completely normal, you will receive your results only by: Marland Kitchen MyChart Message (if you have MyChart) OR . A paper copy in the mail If you have any lab test that is abnormal or we need to change your treatment, we will call you to review the results.   Testing/Procedures: NONE     Follow-Up: At [facility], you and your health needs are our priority.  As part of our continuing mission to provide you with exceptional heart care, we have created designated Provider Care Teams.  These Care Teams include your primary Cardiologist (physician) and Advanced Practice Providers (APPs -  Physician Assistants and Nurse Practitioners) who all work together to provide you with the care you need, when you need it.  We recommend signing up for the patient portal called "MyChart".  Sign up information is provided on this After Visit Summary.  MyChart is used to connect with patients for Virtual Visits (Telemedicine).  Patients are able to view lab/test results, encounter notes, upcoming appointments, etc.  Non-urgent messages can be sent to your provider as well.   To learn more about what you can do with MyChart, go to [email or web address].    Your next appointment:   4 month(s)  The format for your next appointment:   In Person  Provider:   Lewayne Bunting, MD   Other Instructions Thank you for choosing [facility]!

## 2021-01-02 ENCOUNTER — Other Ambulatory Visit: Payer: Self-pay

## 2021-01-02 ENCOUNTER — Encounter (HOSPITAL_COMMUNITY): Payer: Self-pay | Admitting: Emergency Medicine

## 2021-01-02 ENCOUNTER — Emergency Department (HOSPITAL_COMMUNITY)
Admission: EM | Admit: 2021-01-02 | Discharge: 2021-01-02 | Disposition: A | Discharge status: Home / Self Care | Payer: Medicare PPO | Attending: Emergency Medicine | Admitting: Emergency Medicine

## 2021-01-02 DIAGNOSIS — S2232XA Fracture of one rib, left side, initial encounter for closed fracture: Secondary | ICD-10-CM | POA: Diagnosis not present

## 2021-01-02 DIAGNOSIS — Z5321 Procedure and treatment not carried out due to patient leaving prior to being seen by health care provider: Secondary | ICD-10-CM | POA: Diagnosis not present

## 2021-01-02 DIAGNOSIS — S42035A Nondisplaced fracture of lateral end of left clavicle, initial encounter for closed fracture: Secondary | ICD-10-CM | POA: Diagnosis not present

## 2021-01-02 DIAGNOSIS — R000 Tachycardia, unspecified: Secondary | ICD-10-CM | POA: Diagnosis not present

## 2021-01-02 DIAGNOSIS — R531 Weakness: Secondary | ICD-10-CM | POA: Diagnosis not present

## 2021-01-02 DIAGNOSIS — R0902 Hypoxemia: Secondary | ICD-10-CM | POA: Diagnosis not present

## 2021-01-02 DIAGNOSIS — W19XXXA Unspecified fall, initial encounter: Secondary | ICD-10-CM | POA: Diagnosis not present

## 2021-01-02 DIAGNOSIS — Z209 Contact with and (suspected) exposure to unspecified communicable disease: Secondary | ICD-10-CM | POA: Diagnosis not present

## 2021-01-02 NOTE — ED Triage Notes (Signed)
Pt to the ED via [facility] for pain control. Pt was seen here for a fall on 12/10/2.   DG shoulder at that time demonstrates a nondisplaced fracture involving the distal left clavicle as well as a mildly displaced lateral left rib fracture.

## 2021-01-10 DIAGNOSIS — H4052X2 Glaucoma secondary to other eye disorders, left eye, moderate stage: Secondary | ICD-10-CM | POA: Diagnosis not present

## 2021-01-31 DIAGNOSIS — I480 Paroxysmal atrial fibrillation: Secondary | ICD-10-CM | POA: Diagnosis not present

## 2021-02-01 LAB — DIGOXIN LEVEL: Digoxin Level: <0.5 mcg/L — ABNORMAL LOW (ref 0.8–2.0)

## 2021-02-03 ENCOUNTER — Telehealth: Payer: Self-pay | Admitting: *Deleted

## 2021-02-03 DIAGNOSIS — Z79899 Other long term (current) drug therapy: Secondary | ICD-10-CM

## 2021-02-03 MED ORDER — DIGOXIN 125 MCG PO TABS: 0.1250 mg | ORAL_TABLET | Freq: Every day | ORAL | 3 refills | Status: DC

## 2021-02-03 NOTE — Telephone Encounter (Signed)
-----   Message from Evans Lance, MD sent at 02/03/2021  8:50 AM EST ----- Ask the patient to take digoxin 0.125 mg daily, Monday through Saturday, none on Sunday. Repeat digoxin level in 1 month.

## 2021-02-28 ENCOUNTER — Encounter: Payer: Medicare PPO | Admitting: Family Medicine

## 2021-03-03 ENCOUNTER — Other Ambulatory Visit: Payer: Self-pay | Admitting: Internal Medicine

## 2021-03-03 DIAGNOSIS — Z79899 Other long term (current) drug therapy: Secondary | ICD-10-CM | POA: Diagnosis not present

## 2021-03-04 LAB — DIGOXIN LEVEL: Digoxin Level: <0.5 mcg/L — ABNORMAL LOW (ref 0.8–2.0)

## 2021-03-10 ENCOUNTER — Telehealth: Payer: Self-pay

## 2021-03-10 NOTE — Telephone Encounter (Signed)
-----   Message from Evans Lance, MD sent at 03/09/2021  7:50 PM EDT ----- Ask the patient to come in for a 12 lead ECG prior to digoxin uptitration. GT

## 2021-03-10 NOTE — Telephone Encounter (Signed)
Patient is to come in for a nurse visit on 04/28/21 at 4pm for EKG prior to her appt with GT on 04/29/21. Patient is aware and verbalized understanding.

## 2021-03-18 DIAGNOSIS — H31092 Other chorioretinal scars, left eye: Secondary | ICD-10-CM | POA: Diagnosis not present

## 2021-03-18 DIAGNOSIS — H35373 Puckering of macula, bilateral: Secondary | ICD-10-CM | POA: Diagnosis not present

## 2021-03-18 DIAGNOSIS — H35341 Macular cyst, hole, or pseudohole, right eye: Secondary | ICD-10-CM | POA: Diagnosis not present

## 2021-04-28 ENCOUNTER — Other Ambulatory Visit: Payer: Self-pay

## 2021-04-28 ENCOUNTER — Ambulatory Visit (INDEPENDENT_AMBULATORY_CARE_PROVIDER_SITE_OTHER): Payer: Medicare PPO

## 2021-04-28 DIAGNOSIS — Z5181 Encounter for therapeutic drug level monitoring: Secondary | ICD-10-CM

## 2021-04-28 NOTE — Progress Notes (Signed)
Pt is here today for a pre-titration of Digoxin EKG per Dr. Lovena Le. Pt states that she has been feeling pretty good. Pt did state that a couple of months ago, she had taken a couple of big spills and fell. She stated that she hurts all over. On a scale of 1-10, 10 being the worst, she said she feels like her pain is a level 8. Resting however is a 1.  Pt states that she has had no CP, palpitations, or dizziness since starting Digoxin.

## 2021-04-29 ENCOUNTER — Other Ambulatory Visit (HOSPITAL_COMMUNITY)
Admission: RE | Admit: 2021-04-29 | Discharge: 2021-04-29 | Disposition: A | Discharge status: Home / Self Care | Payer: Medicare PPO | Source: Ambulatory Visit | Attending: Internal Medicine | Admitting: Internal Medicine

## 2021-04-29 ENCOUNTER — Ambulatory Visit: Payer: Medicare PPO | Admitting: Internal Medicine

## 2021-04-29 ENCOUNTER — Encounter: Payer: Self-pay | Admitting: Internal Medicine

## 2021-04-29 ENCOUNTER — Other Ambulatory Visit: Payer: Self-pay

## 2021-04-29 VITALS — BP 120/70 | HR 66 | Ht 65.0 in | Wt 121.0 lb

## 2021-04-29 DIAGNOSIS — Z79899 Other long term (current) drug therapy: Secondary | ICD-10-CM | POA: Insufficient documentation

## 2021-04-29 LAB — DIGOXIN LEVEL: Digoxin Level: 0.8 ng/mL (ref 0.8–2.0)

## 2021-04-29 NOTE — Patient Instructions (Signed)
Medication Instructions:  Your physician recommends that you continue on your current medications as directed. Please refer to the Current Medication list given to you today.  *If you need a refill on your cardiac medications before your next appointment, please call your pharmacy*   Lab Work: Your physician recommends that you return for lab work in: Today   If you have labs (blood work) drawn today and your tests are completely normal, you will receive your results only by: Marland Kitchen MyChart Message (if you have MyChart) OR . A paper copy in the mail If you have any lab test that is abnormal or we need to change your treatment, we will call you to review the results.   Testing/Procedures: Loop Recorder   Follow-Up: At [facility], you and your health needs are our priority.  As part of our continuing mission to provide you with exceptional heart care, we have created designated Provider Care Teams.  These Care Teams include your primary Cardiologist (physician) and Advanced Practice Providers (APPs -  Physician Assistants and Nurse Practitioners) who all work together to provide you with the care you need, when you need it.  We recommend signing up for the patient portal called "MyChart".  Sign up information is provided on this After Visit Summary.  MyChart is used to connect with patients for Virtual Visits (Telemedicine).  Patients are able to view lab/test results, encounter notes, upcoming appointments, etc.  Non-urgent messages can be sent to your provider as well.   To learn more about what you can do with MyChart, go to [email or web address].    Your next appointment:    In the [facility]   The format for your next appointment:   In Person  Provider:   Cristopher Peru, MD   Other Instructions Thank you for choosing [facility]!

## 2021-04-29 NOTE — Progress Notes (Signed)
HPI [name] returns today for followup. She has a h/o atrial flutter s/p ablation and then developed atrial fib and has been treated with flecainide, digoxin and a beta blocker. She fell out about 4 months ago and broke her shoulder. She does not think that she actually lost consciousness but did lose postural tone. She has had difficult to control atrial fib. SHe denies chest pain or sob. No edema.  No Known Allergies   Current Outpatient Medications  Medication Sig Dispense Refill  . acetaminophen (TYLENOL) 500 MG tablet Take 1 tablet (500 mg total) by mouth every 6 (six) hours as needed. 30 tablet 0  . apixaban (ELIQUIS) 5 MG TABS tablet Take 1 tablet (5 mg total) by mouth 2 (two) times daily. 180 tablet 3  . calcium-vitamin D (OSCAL WITH D) 500-200 MG-UNIT tablet Take 1 tablet by mouth 2 (two) times daily.     . digoxin (LANOXIN) 0.125 MG tablet Take 1 tablet (0.125 mg total) by mouth daily. Take 1 Tablet 0.125 mg Daily Monday -Saturday and none on _7  years of age  . CARDIOVERSION N/A 10/03/2015   Procedure: CARDIOVERSION;  Surgeon: Herminio Commons, MD;  Location: [facility];  Service: Cardiovascular;  Laterality: N/A;  . COLONOSCOPY N/A 07/30/2016   Procedure: COLONOSCOPY;  Surgeon: Rogene Houston, MD;  [facility];  Service: Endoscopy;  Laterality: N/A;  8:30  . EYE SURGERY     left  . HEMORROIDECTOMY    . TONSILLECTOMY     childhood     Family History  Problem Relation Age of Onset  . Hearing loss Mother   . Alzheimer's disease Father      Social History   Socioeconomic History  . Marital status: Married    Spouse name: Not on file  . Number of children: Not on file  . Years of education: Not on file  . Highest education level: Not on file  Occupational History  . Not on file  Tobacco Use  . Smoking status: Never Smoker  . Smokeless tobacco: Never Used  Vaping Use  . Vaping Use: Never used  Substance and Sexual Activity  . Alcohol use: Yes    Alcohol/week: 7.0  standard drinks    Types: 7 Glasses of wine per week    Comment: one glass of wine with dinner each night  . Drug use: No  . Sexual activity: Not Currently  Other Topics Concern  . Not on file  Social History Narrative  . Not on file   Social Determinants of Health   Financial Resource Strain: Not on file  Food Insecurity: Not on file  Transportation Needs: Not on file  Physical Activity: Not on file  Stress: Not on file  Social Connections: Not on file  Intimate Partner Violence: Not on file     BP 120/70   Pulse 66   Ht _0  (1.651 m)   Wt 121 lb (54.9 kg)   SpO2 98%   BMI 20.14 kg/m   Physical Exam:  Well appearing NAD HEENT: Unremarkable Neck:  No JVD, no thyromegally Lymphatics:  No adenopathy Back:  No CVA tenderness Lungs:  Clear HEART:  IRegular rate rhythm, no murmurs, no rubs, no clicks Abd:  soft, positive bowel sounds, no organomegally, no rebound, no guarding Ext:  2 plus pulses, no edema, no cyanosis, no clubbing Skin:  No rashes no nodules Neuro:  CN II through XII intact, motor grossly intact   Assess/Plan: 1. Syncope - The etiology is unknown. I strongly suspect that she had a pause. I have recommended she undergo ILR insertion.  2. Atrial fib - she appears to be in atrial fib today with a well controlled VR. Previously her VR was not controlled. An ILR will help [place] with atrial fib monitoring of her meds. 3. Weight loss - she is down 2 more pounds from her last visit. We will follow. 4. Coags - she denies missing her Eliquis. We will follow.  [name],MD

## 2021-04-30 ENCOUNTER — Telehealth: Payer: Self-pay

## 2021-04-30 NOTE — Telephone Encounter (Signed)
Pt notified and verbalized understanding. No questions or concerns at this time.

## 2021-04-30 NOTE — Telephone Encounter (Signed)
-----   Message from Evans Lance, MD sent at 04/29/2021  9:10 PM EDT ----- Digoxin level is low therapeutic. Continue at current dose.

## 2021-05-19 ENCOUNTER — Encounter: Payer: Self-pay | Admitting: *Deleted

## 2021-05-19 ENCOUNTER — Telehealth: Payer: Self-pay

## 2021-05-19 DIAGNOSIS — R55 Syncope and collapse: Secondary | ICD-10-CM

## 2021-05-19 NOTE — Progress Notes (Signed)
Patient ID: Kelly Dougherty, female   DOB: April 14, 1941, 80 y.o.   MRN: [id number] Patient enrolled for [employer] to ship a 30 day cardiac event monitor to her home. Letter with instructions mailed to patient.

## 2021-05-19 NOTE — Telephone Encounter (Signed)
Pt advised that loop recorder implant had been denied.  Per pt's insurance she must complete 30 day monitor before they will cover loop implant.  Per Dr. Elodia Florence 30 day monitor.  Pt in agreement.

## 2021-05-20 ENCOUNTER — Ambulatory Visit: Payer: Medicare PPO | Admitting: Internal Medicine

## 2021-05-23 ENCOUNTER — Ambulatory Visit (INDEPENDENT_AMBULATORY_CARE_PROVIDER_SITE_OTHER): Payer: Medicare PPO

## 2021-05-23 ENCOUNTER — Telehealth: Payer: Self-pay | Admitting: Internal Medicine

## 2021-05-23 ENCOUNTER — Encounter: Payer: Self-pay | Admitting: Internal Medicine

## 2021-05-23 DIAGNOSIS — R55 Syncope and collapse: Secondary | ICD-10-CM

## 2021-05-23 NOTE — Telephone Encounter (Signed)
[employer] was calling with a critical EKG for this patient

## 2021-05-23 NOTE — Telephone Encounter (Signed)
First documentation of afib This is the baseline rhythm strip. Afib RVR 110-120.   Awaiting fax. Patient has know paroxysmal atrial fibrillation.

## 2021-05-23 NOTE — Telephone Encounter (Signed)
Fax received.  Signed by DOD.  Will scan to chart.  Pt is on Eliquis, digoxin, flecainide and metoprolol.  Monitor ordered to look for sinus pause that might have caused syncope.  Will continue to monitor.

## 2021-07-23 ENCOUNTER — Telehealth: Payer: Self-pay | Admitting: *Deleted

## 2021-07-23 NOTE — Telephone Encounter (Signed)
Call placed to pt.  Left her a message to call back.  Need to discuss with her that Dr. Lovena Le is recommending a a Loop Recorder.    Will wait for return call.

## 2021-07-23 NOTE — Telephone Encounter (Signed)
Pt returned my call.  She states that since the event monitor didn't show anything, she will pass on the ILR Placement.   Pt was advised that if she continues to have symptoms, to call our office back and let us know and we will go from there.   Pt verbalized understanding and thanked me for the call.

## 2021-08-05 ENCOUNTER — Ambulatory Visit: Payer: Medicare PPO | Admitting: Cardiovascular Disease

## 2021-08-06 ENCOUNTER — Other Ambulatory Visit: Payer: Self-pay | Admitting: Internal Medicine

## 2021-08-16 ENCOUNTER — Other Ambulatory Visit: Payer: Self-pay | Admitting: Student

## 2021-08-18 NOTE — Telephone Encounter (Signed)
Eliquis 5 mg refill request received. Patient is 80 years old, weight- 54.9 kg, Crea- 0.74 on 03/04/20- pt is overdue for repeat labs , Diagnosis-afib, and last seen by Dr. Lovena Le on 04/29/21- has upcoming appt w/ Bernerd Pho, [place] on 10/24/21. Dose is NOT appropriate based on dosing criteria.  Pt's age is >80, wt is <60 kg, so pt qualifies for dosage decrease and should be on 2.5 mg BID. Will route to [place] for her recommendation.

## 2021-08-18 NOTE — Telephone Encounter (Signed)
Hey! I hope you are doing well!  I have not evaluated this patient since 2019 but it looks like she is scheduled to see me in 09/2021. Yes, I agree with reducing dosing to 2.'5mg'$  BID given her age and weight.   Thanks,  [place]

## 2021-08-18 NOTE — Telephone Encounter (Signed)
Left message for pt that I am sending in a different dosage of Eliquis, so her rx will look different.  Asked her to call back w/ any questions or concerns.

## 2021-10-23 NOTE — Progress Notes (Addendum)
Cardiology Office Note    Date:  10/24/2021   ID:  ARMANII URBANIK, DOB 04/11/1941, MRN [id number]  PCP:  Pcp, No  Cardiologist: Previously Dr. Bronson Ing --> Needs to switch to new MD EP: Dr. Lovena Le  Chief Complaint  Patient presents with   Follow-up    6 month visit    History of Present Illness:    Kelly Dougherty is a 80 y.o. female with past medical history of paroxysmal atrial fibrillation/flutter (s/p ablation in 09/2019 with recurrence and on Flecainide), HFpEF, history of tachycardia-induced cardiomyopathy (EF 45-50% in 2016, at 55-60% by repeat imaging in 08/2019) and glaucoma who presents to the office today for 58-monthfollow-up.   She was last examined by Dr. TLovena Lein 04/2021 and reported still having intermittent palpitations but no chest pain or dyspnea. She did report a recent syncopal episode and ILR was recommended but this was denied by her insurance company. Therefore, a 30-day monitor was placed and showed predominately atrial fibrillation and some flutter with slow ventricular response but no RVR or prolonged pauses.   In talking with the patient today, she reports overall doing well from a cardiac perspective since her last visit. No recurrent syncopal episodes since 11/2020. No recent dizziness or presyncope. She denies any recent chest pain, dyspnea on exertion, orthopnea, PND or pitting edema. Does experience occasional palpitations but no persistent symptoms. She is followed by Opthalmology for glaucoma and reports her vision loss continues to worsen in her left eye.   Past Medical History:  Diagnosis Date   Arthritis    Atrial fibrillation ([facility]    Cataract    Dysrhythmia    04/2015 with atrial fibrillation   Glaucoma    History of echocardiogram    Echo 1/19: EF 60-65, no RWMA, normal diastolic function, mild to mod AI, MAC, mild to mod MR, mild BAE, mod TR, PASP 40    Past Surgical History:  Procedure Laterality Date   A-FLUTTER ABLATION N/A  10/02/2019   Procedure: A-FLUTTER ABLATION;  Surgeon: TEvans Lance MD;  [facility];  Service: Cardiovascular;  Laterality: N/A;   APPENDECTOMY     80years of age   CARDIOVERSION N/A 10/03/2015   Procedure: CARDIOVERSION;  Surgeon: SHerminio Commons MD;  [facility]: [facility];  Service: Cardiovascular;  Laterality: N/A;   COLONOSCOPY N/A 07/30/2016   Procedure: COLONOSCOPY;  Surgeon: NRogene Houston MD;  [facility];  Service: Endoscopy;  Laterality: N/A;  8:30   EYE SURGERY     left   HEMORROIDECTOMY     TONSILLECTOMY     childhood    Current Medications: Outpatient Medications Prior to Visit  Medication Sig Dispense Refill   acetaminophen (TYLENOL) 500 MG tablet Take 1 tablet (500 mg total) by mouth every 6 (six) hours as needed. 30 tablet 0   apixaban (ELIQUIS) 2.5 MG TABS tablet Take 1 tablet (2.5 mg total) by mouth 2 (two) times daily. 60 tablet 3   calcium-vitamin D (OSCAL WITH D) 500-200 MG-UNIT tablet Take 1 tablet by mouth 2 (two) times daily.      digoxin (LANOXIN) 0.125 MG tablet Take 1 tablet (0.125 mg total) by mouth daily. Take 1 Tablet 0.125 mg Daily Monday -Saturday and none on Sunday 90 tablet 3   Docusate Calcium (STOOL SOFTENER PO) Take 100 mg by mouth daily.      flecainide (TAMBOCOR) 50 MG tablet Take 1 tablet (50 mg total) by mouth every 12 (  twelve) hours. 60 tablet 11   furosemide (LASIX) 20 MG tablet TAKE 1 TABLET BY MOUTH DAILY AS NEEDED FOR SWELLING (Patient taking differently: Take 20 mg by mouth daily as needed for edema.) 90 tablet 3   latanoprost (XALATAN) 0.005 % ophthalmic solution Place 1 drop into both eyes at bedtime.     metoprolol succinate (TOPROL-XL) 50 MG 24 hr tablet TAKE 1 TABLET BY MOUTH ONCE DAILY AFTER  SUPPER 30 tablet 11   Netarsudil Dimesylate 0.02 % SOLN Apply to eye.     potassium chloride (K-DUR,KLOR-CON) 10 MEQ tablet TAKE 1 TABLET BY MOUTH ONLY ON DAYS YOUR TAKING LASIX FOR SWELLING (Patient taking  differently: Take 10 mEq by mouth daily as needed. TAKE 10 meqTABLET BY MOUTH ONLY ON DAYS YOUR TAKING LASIX FOR SWELLING) 90 tablet 3   timolol (BETIMOL) 0.5 % ophthalmic solution Place 1 drop into the left eye 2 (two) times daily.     timolol (TIMOPTIC) 0.5 % ophthalmic solution Place 1 drop into the left eye 2 (two) times daily.     No facility-administered medications prior to visit.     Allergies:   Patient has no known allergies.   Social History   Socioeconomic History   Marital status: Married    Spouse name: Not on file   Number of children: Not on file   Years of education: Not on file   Highest education level: Not on file  Occupational History   Not on file  Tobacco Use   Smoking status: Never   Smokeless tobacco: Never  Vaping Use   Vaping Use: Never used  Substance and Sexual Activity   Alcohol use: Yes    Alcohol/week: 7.0 standard drinks    Types: 7 Glasses of wine per week    Comment: one glass of wine with dinner each night   Drug use: No   Sexual activity: Not Currently  Other Topics Concern   Not on file  Social History Narrative   Not on file   Social Determinants of Health   Financial Resource Strain: Not on file  Food Insecurity: Not on file  Transportation Needs: Not on file  Physical Activity: Not on file  Stress: Not on file  Social Connections: Not on file     Family History:  The patient's family history includes Alzheimer's disease in her father; Hearing loss in her mother.   Review of Systems:    Please see the history of present illness.     All other systems reviewed and are otherwise negative except as noted above.   Physical Exam:    VS:  BP 124/82   Pulse 82   Wt 119 lb (54 kg)   SpO2 98%   BMI 19.80 kg/m    General: Well developed, well nourished,female appearing in no acute distress. Head: Normocephalic, atraumatic. Neck: No carotid bruits. JVD not elevated.  Lungs: Respirations regular and unlabored, without  wheezes or rales.  Heart: Irregularly irregular. No S3 or S4.  No murmur, no rubs, or gallops appreciated. Abdomen: Appears non-distended. No obvious abdominal masses. Msk:  Strength and tone appear normal for age. No obvious joint deformities or effusions. Extremities: No clubbing or cyanosis. No pitting edema.  Distal pedal pulses are 2+ bilaterally. Neuro: Alert and oriented X 3. Moves all extremities spontaneously. No focal deficits noted. Psych:  Responds to questions appropriately with a normal affect. Skin: No rashes or lesions noted  Wt Readings from Last 3 Encounters:  10/24/21 119 lb (  54 kg)  04/29/21 121 lb (54.9 kg)  01/01/21 123 lb (55.8 kg)     Studies/Labs Reviewed:   EKG:  EKG is not ordered today.    Recent Labs: 10/24/2021: BUN 10; Creatinine, Ser 0.77; Hemoglobin 14.9; Platelets 199; Potassium 4.2; Sodium 137   Lipid Panel    Component Value Date/Time   CHOL 191 03/04/2020 0908   TRIG 68 03/04/2020 0908   HDL 54 03/04/2020 0908   CHOLHDL 3.5 03/04/2020 0908   VLDL 15 01/04/2017 0854   LDLCALC 121 (H) 03/04/2020 0908    Additional studies/ records that were reviewed today include:   Echocardiogram: 08/2019 IMPRESSIONS     1. The left ventricle has normal systolic function, with an ejection  fraction of 55-60%. The cavity size was normal. Left ventricular diastolic  Doppler parameters are indeterminate.   2. The right ventricle has normal systolic function. The cavity was  normal. There is no increase in right ventricular wall thickness.   3. Left atrial size was severely dilated.   4. Right atrial size was severely dilated.   5. The mitral valve is abnormal. Mild thickening of the mitral valve  leaflet. Mild calcification of the mitral valve leaflet. There is mild  mitral annular calcification present.   6. The tricuspid valve is abnormal. Tricuspid valve regurgitation is  moderate-severe.   7. The aortic valve is tricuspid. Aortic valve  regurgitation is mild by  color flow Doppler. No stenosis of the aortic valve.   8. The aorta is normal unless otherwise noted.   9. The aortic root is normal in size and structure.  10. Pulmonary hypertension is mildly elevated, PASP is 35 mmHg.  11. The inferior vena cava was dilated in size with >50% respiratory  variability.   Event Monitor: 04/2021 1. Predominantly coarse atrial fib and some atrial flutter with a slow VR and a controlled VR.  2. Noise artifact 3. No prolonged pauses 4. No ventricular arrhythmias.  Assessment:    1. Paroxysmal atrial fibrillation (HCC)   2. Medication management   3. History of cardiomyopathy   4. Valvular heart disease      Plan:   In order of problems listed above:  1. Paroxysmal Atrial Fibrillation/Flutter - She did undergo ablation in 09/2019 but has experienced recurrence and is on Flecainide per EP. Recent monitor showed predominately atrial fibrillation and some flutter as outlined above but no significant arrhythmias or pauses and she did not wish to undergo ILR placement.  - Continue Digoxin 0.145m daily, Flecainide 531mBID and Toprol-XL 5036maily. Will recheck BMET and Dig Level.  - She denies any evidence of active bleeding and remains on Eliquis 2.5mg30mD for anticoagulation which is the appropriate dose at this time given her age of 80 a61 weight at 119 lbs. Recheck CBC.   2. History of Cardiomyopathy - Her EF was previously at 45-50% in 2016, normalized to 55-60% by repeat imaging in 08/2019. She appears euvolemic on examination and has not had to utilize PRN Lasix recently. Continue Toprol-XL and Digoxin at current dosing.   3. Valvular Heart Disease - Echocardiogram in 08/2019 showed moderate to severe TR and mild MR which had improved as this was previously moderate. Would anticipate obtaining a follow-up echocardiogram next year.     Medication Adjustments/Labs and Tests Ordered: Current medicines are reviewed at  length with the patient today.  Concerns regarding medicines are outlined above.  Medication changes, Labs and Tests ordered today are listed in the  Patient Instructions below. Patient Instructions  Medication Instructions:  None  Labwork: CBC BMET DIGOXIN  Testing/Procedures: None  Follow-Up: 6 month follow up with Dr. Lovena Le  Any Other Special Instructions Will Be Listed Below (If Applicable).     If you need a refill on your cardiac medications before your next appointment, please call your pharmacy.   Signed, Erma Heritage, PA-C  10/24/2021 7:02 PM    [facility] [address] [place], [place] [address] Phone: [phone] Fax: [phone]

## 2021-10-24 ENCOUNTER — Encounter: Payer: Self-pay | Admitting: Student

## 2021-10-24 ENCOUNTER — Other Ambulatory Visit (HOSPITAL_COMMUNITY)
Admission: RE | Admit: 2021-10-24 | Discharge: 2021-10-24 | Disposition: A | Discharge status: Home / Self Care | Payer: Medicare PPO | Source: Ambulatory Visit | Attending: Student | Admitting: Student

## 2021-10-24 ENCOUNTER — Ambulatory Visit: Payer: Medicare PPO | Admitting: Student

## 2021-10-24 ENCOUNTER — Other Ambulatory Visit: Payer: Self-pay

## 2021-10-24 VITALS — BP 124/82 | HR 82 | Wt 119.0 lb

## 2021-10-24 DIAGNOSIS — I480 Paroxysmal atrial fibrillation: Secondary | ICD-10-CM

## 2021-10-24 DIAGNOSIS — I38 Endocarditis, valve unspecified: Secondary | ICD-10-CM | POA: Diagnosis not present

## 2021-10-24 DIAGNOSIS — Z79899 Other long term (current) drug therapy: Secondary | ICD-10-CM | POA: Diagnosis present

## 2021-10-24 DIAGNOSIS — Z8679 Personal history of other diseases of the circulatory system: Secondary | ICD-10-CM

## 2021-10-24 LAB — DIGOXIN LEVEL: Digoxin Level: 0.7 ng/mL — ABNORMAL LOW (ref 0.8–2.0)

## 2021-10-24 LAB — CBC
HCT: 44.6 % (ref 36.0–46.0)
Hemoglobin: 14.9 g/dL (ref 12.0–15.0)
MCH: 32 pg (ref 26.0–34.0)
MCHC: 33.4 g/dL (ref 30.0–36.0)
MCV: 95.7 fL (ref 80.0–100.0)
Platelets: 199 10*3/uL (ref 150–400)
RBC: 4.66 MIL/uL (ref 3.87–5.11)
RDW: 12.8 % (ref 11.5–15.5)
WBC: 4.9 10*3/uL (ref 4.0–10.5)
nRBC: 0 % (ref 0.0–0.2)

## 2021-10-24 LAB — BASIC METABOLIC PANEL
Anion gap: 8 (ref 5–15)
BUN: 10 mg/dL (ref 8–23)
CO2: 26 mmol/L (ref 22–32)
Calcium: 9.2 mg/dL (ref 8.9–10.3)
Chloride: 103 mmol/L (ref 98–111)
Creatinine, Ser: 0.77 mg/dL (ref 0.44–1.00)
GFR, Estimated: >60 mL/min (ref >60)
Glucose, Bld: 99 mg/dL (ref 70–99)
Potassium: 4.2 mmol/L (ref 3.5–5.1)
Sodium: 137 mmol/L (ref 135–145)

## 2021-10-24 NOTE — Patient Instructions (Signed)
Medication Instructions:  None  Labwork: CBC BMET DIGOXIN  Testing/Procedures: None  Follow-Up: 6 month follow up with Dr. Lovena Le  Any Other Special Instructions Will Be Listed Below (If Applicable).     If you need a refill on your cardiac medications before your next appointment, please call your pharmacy.

## 2021-11-01 ENCOUNTER — Other Ambulatory Visit: Payer: Self-pay | Admitting: Internal Medicine

## 2022-01-11 ENCOUNTER — Other Ambulatory Visit: Payer: Self-pay | Admitting: Student

## 2022-01-12 NOTE — Telephone Encounter (Signed)
Prescription refill request for Eliquis received. Indication: Afib  Last office visit:10/24/21 (Strader)  Scr: 0.77 (10/24/21)  Age: 81 Weight: 54kg  Appropriate dose and refill sent to requested pharmacy.

## 2022-01-21 ENCOUNTER — Other Ambulatory Visit: Payer: Self-pay | Admitting: Internal Medicine

## 2022-05-15 ENCOUNTER — Other Ambulatory Visit: Payer: Self-pay | Admitting: Internal Medicine

## 2022-05-15 DIAGNOSIS — I482 Chronic atrial fibrillation, unspecified: Secondary | ICD-10-CM

## 2022-05-15 NOTE — Telephone Encounter (Signed)
Prescription refill request for Eliquis received. Indication: Afib  Last office visit:10/24/21 (Strader)  Scr: 0.77 (10/24/21) Age: 81 Weight: 54kg  Appropriate dose and refill sent to requested pharmacy.

## 2022-08-12 ENCOUNTER — Other Ambulatory Visit: Payer: Self-pay | Admitting: Internal Medicine

## 2022-10-13 ENCOUNTER — Other Ambulatory Visit: Payer: Self-pay | Admitting: Internal Medicine

## 2022-10-14 ENCOUNTER — Telehealth: Payer: Self-pay | Admitting: Internal Medicine

## 2022-10-14 MED ORDER — DIGOXIN 125 MCG PO TABS: ORAL_TABLET | ORAL | 0 refills | Status: DC

## 2022-10-14 NOTE — Telephone Encounter (Signed)
*  STAT* If patient is at the pharmacy, call can be transferred to refill team.   1. Which medications need to be refilled? (please list name of each medication and dose if known)   digoxin (LANOXIN) 0.125 MG tablet    2. Which pharmacy/location (including street and city if local pharmacy) is medication to be sent to?  [place], [place] - [address] Phone:  [phone]        3. Do they need a 30 day or 90 day supply?  90 day   Pt has scheduled appt on 11/24/22. Please advise

## 2022-10-14 NOTE — Telephone Encounter (Signed)
Completed.

## 2022-10-15 ENCOUNTER — Telehealth: Payer: Self-pay | Admitting: Internal Medicine

## 2022-10-15 ENCOUNTER — Other Ambulatory Visit: Payer: Self-pay

## 2022-10-15 MED ORDER — DIGOXIN 125 MCG PO TABS
ORAL_TABLET | ORAL | 1 refills | Status: DC
Start: 2022-10-15 — End: 2023-04-20

## 2022-10-15 NOTE — Telephone Encounter (Signed)
*  STAT* If patient is at the pharmacy, call can be transferred to refill team.   1. Which medications need to be refilled? (please list name of each medication and dose if known) digoxin (LANOXIN) 0.125 MG tablet  2. Which pharmacy/location (including street and city if local pharmacy) is medication to be sent to?  [place], [place] - [address]  3. Do they need a 30 day or 90 day supply? 68  Pt states the pharmacy told her they have not received refill yet.

## 2022-10-15 NOTE — Telephone Encounter (Signed)
Completed.

## 2022-10-28 ENCOUNTER — Other Ambulatory Visit: Payer: Self-pay | Admitting: Internal Medicine

## 2022-10-28 DIAGNOSIS — I482 Chronic atrial fibrillation, unspecified: Secondary | ICD-10-CM

## 2022-10-28 NOTE — Telephone Encounter (Signed)
Prescription refill request for Eliquis received. Indication: A Flutter Last office visit: 10/24/21  B Strader PA-C ( Appt / Dr Lovena Le 11/24/22) Scr: 0.77 on 10/24/21 Age: 81 Weight: 54kg  Based on above findings Eliquis 2.'5mg'$  twice daily is the appropriate dose.  Refill approved.

## 2022-11-14 ENCOUNTER — Other Ambulatory Visit: Payer: Self-pay | Admitting: Internal Medicine

## 2022-11-16 ENCOUNTER — Other Ambulatory Visit: Payer: Self-pay

## 2022-11-16 MED ORDER — METOPROLOL SUCCINATE ER 50 MG PO TB24: 50.0000 mg | ORAL_TABLET | Freq: Every day | ORAL | 0 refills | Status: DC

## 2022-11-24 ENCOUNTER — Encounter: Payer: Self-pay | Admitting: Internal Medicine

## 2022-11-24 ENCOUNTER — Ambulatory Visit: Payer: Medicare PPO | Attending: Internal Medicine | Admitting: Internal Medicine

## 2022-11-24 VITALS — BP 112/70 | HR 78 | Ht 64.0 in | Wt 121.0 lb

## 2022-11-24 DIAGNOSIS — I482 Chronic atrial fibrillation, unspecified: Secondary | ICD-10-CM | POA: Diagnosis not present

## 2022-11-24 NOTE — Patient Instructions (Signed)
Medication Instructions:   Stop Taking Flecainide   *If you need a refill on your cardiac medications before your next appointment, please call your pharmacy*   Lab Work: NONE   If you have labs (blood work) drawn today and your tests are completely normal, you will receive your results only by: [place] (if you have MyChart) OR A paper copy in the mail If you have any lab test that is abnormal or we need to change your treatment, we will call you to review the results.   Testing/Procedures: NONE    Follow-Up: At [facility], you and your health needs are our priority.  As part of our continuing mission to provide you with exceptional heart care, we have created designated Provider Care Teams.  These Care Teams include your primary Cardiologist (physician) and Advanced Practice Providers (APPs -  Physician Assistants and Nurse Practitioners) who all work together to provide you with the care you need, when you need it.  We recommend signing up for the patient portal called "MyChart".  Sign up information is provided on this After Visit Summary.  MyChart is used to connect with patients for Virtual Visits (Telemedicine).  Patients are able to view lab/test results, encounter notes, upcoming appointments, etc.  Non-urgent messages can be sent to your provider as well.   To learn more about what you can do with MyChart, go to [email or web address].    Your next appointment:   1 year(s)  The format for your next appointment:   In Person  Provider:   Cristopher Peru, MD    Other Instructions Thank you for choosing [facility]!    Important Information About Sugar

## 2022-11-24 NOTE — Progress Notes (Signed)
HPI Kelly Dougherty returns today for followup. She is a pleasant 81 yo woman with a h/o atrial flutter and atrial fib. She has had a flutter ablation and then was continued on flecainide. She has had recurrent atrial fib/flutter. She feels palpitations and at night when she lies down feels like her heart is beating fast. When I saw her last we started digoxin and continued her toprol. No edema and no chest pain. No syncope. She c/o feeling cold.   No Known Allergies   Current Outpatient Medications  Medication Sig Dispense Refill   acetaminophen (TYLENOL) 500 MG tablet Take 1 tablet (500 mg total) by mouth every 6 (six) hours as needed. 30 tablet 0   apixaban (ELIQUIS) 2.5 MG TABS tablet Take 1 tablet by mouth twice daily 180 tablet 1   calcium-vitamin D (OSCAL WITH D) 500-200 MG-UNIT tablet Take 1 tablet by mouth 2 (two) times daily.      digoxin (LANOXIN) 0.125 MG tablet TAKE 1 TABLET BY MOUTH ONCE DAILY ON MONDAY THROUGH SATURDAY. NONE ON _65  years of age   29 N/A 10/03/2015   Procedure: CARDIOVERSION;  Surgeon: Herminio Commons, MD;  Location: [facility];  Service: Cardiovascular;  Laterality: N/A;   COLONOSCOPY N/A 07/30/2016   Procedure: COLONOSCOPY;  Surgeon: Rogene Houston, MD;  [facility];  Service: Endoscopy;  Laterality: N/A;  8:30   EYE SURGERY     left   HEMORROIDECTOMY     TONSILLECTOMY     childhood     Family History  Problem Relation Age of Onset   Hearing loss Mother    Alzheimer's disease Father      Social History   Socioeconomic History   Marital status: Married    Spouse name: Not on file   Number of children: Not on file   Years of education: Not on file   Highest education level: Not on file  Occupational History   Not on file  Tobacco Use   Smoking status: Never   Smokeless tobacco: Never  Vaping Use   Vaping Use: Never used  Substance and  Sexual Activity   Alcohol use: Yes    Alcohol/week: 7.0 standard drinks of alcohol    Types: 7 Glasses of wine per week    Comment: one glass of wine with dinner each night   Drug use: No   Sexual activity: Not Currently  Other Topics Concern   Not on file  Social History Narrative   Not on file   Social Determinants of Health   Financial Resource Strain: Not on file  Food Insecurity: Not on file  Transportation Needs: Not on file  Physical Activity: Not on file  Stress: Not on file  Social Connections: Not on file  Intimate Partner Violence: Not on file     BP 112/70 (BP Location: Right Arm, Patient Position: Sitting, Cuff Size: Normal)   Pulse 78   Ht _0  (1.626 m)   Wt 121 lb (54.9 kg)   BMI 20.77 kg/m   Physical Exam:  Well appearing NAD HEENT: Unremarkable Neck:  No JVD, no thyromegally Lymphatics:  No adenopathy Back:  No CVA tenderness Lungs:  Clear HEART:  IRegular rate rhythm, no murmurs, no rubs, no clicks Abd:  soft, positive bowel sounds, no organomegally, no rebound, no guarding Ext:  2 plus pulses, no edema, no cyanosis, no clubbing Skin:  No rashes no nodules Neuro:  CN II through XII intact, motor grossly intact  EKG - atrial fib with a controlled VR  DEVICE  Normal device function.  See PaceArt for details.   Assess/Plan: 1. Uncontrolled atrial fib/flutter - she appears to now be well controlled. I asked her to stop her flecainide. 2. coags - she has had no bleeding on eliquis. 3. HTN her bp is well controlled. We will continue her beta blocker 4. Weight loss - her weight has stabilized.   Kelly Overlie Cambrea Kirt,MD

## 2022-12-15 ENCOUNTER — Other Ambulatory Visit: Payer: Self-pay | Admitting: Internal Medicine

## 2022-12-16 ENCOUNTER — Telehealth: Payer: Self-pay | Admitting: Internal Medicine

## 2022-12-16 MED ORDER — METOPROLOL SUCCINATE ER 50 MG PO TB24
50.0000 mg | ORAL_TABLET | Freq: Every day | ORAL | 3 refills | Status: DC
Start: 2022-12-16 — End: 2023-12-08

## 2022-12-16 NOTE — Telephone Encounter (Signed)
*  STAT* If patient is at the pharmacy, call can be transferred to refill team.   1. Which medications need to be refilled? (please list name of each medication and dose if known)  metoprolol succinate (TOPROL-XL) 50 MG 24 hr tablet  2. Which pharmacy/location (including street and city if local pharmacy) is medication to be sent to? [place], [place] - [address]  3. Do they need a 30 day or 90 day supply?  90 day supply  Current Rx denied stating that patient needs an appointment. Please update--patient was seen on 11/28.

## 2022-12-16 NOTE — Telephone Encounter (Signed)
Rx has been updated and resent to pharmacy.

## 2023-02-08 ENCOUNTER — Ambulatory Visit: Payer: Self-pay

## 2023-02-08 ENCOUNTER — Ambulatory Visit (INDEPENDENT_AMBULATORY_CARE_PROVIDER_SITE_OTHER): Payer: Medicare PPO

## 2023-02-08 ENCOUNTER — Encounter: Payer: Self-pay | Admitting: Orthopedic Surgery

## 2023-02-08 ENCOUNTER — Ambulatory Visit (INDEPENDENT_AMBULATORY_CARE_PROVIDER_SITE_OTHER): Payer: Medicare PPO | Admitting: Orthopedic Surgery

## 2023-02-08 DIAGNOSIS — M79601 Pain in right arm: Secondary | ICD-10-CM | POA: Diagnosis not present

## 2023-02-08 DIAGNOSIS — M19011 Primary osteoarthritis, right shoulder: Secondary | ICD-10-CM | POA: Diagnosis not present

## 2023-02-08 MED ORDER — BUPIVACAINE HCL 0.5 % IJ SOLN
9.0000 mL | INTRAMUSCULAR | Status: AC | PRN
Start: 2023-02-08 — End: 2023-02-08
  Administered 2023-02-08: 9 mL via INTRA_ARTICULAR

## 2023-02-08 MED ORDER — METHYLPREDNISOLONE ACETATE 40 MG/ML IJ SUSP
40.0000 mg | INTRAMUSCULAR | Status: AC | PRN
  Administered 2023-02-08: 40 mg via INTRA_ARTICULAR

## 2023-02-08 MED ORDER — LIDOCAINE HCL 1 % IJ SOLN
5.0000 mL | INTRAMUSCULAR | Status: AC | PRN
  Administered 2023-02-08: 5 mL

## 2023-02-08 NOTE — Progress Notes (Signed)
Office Visit Note   Patient: Kelly Dougherty           Date of Birth: 1941-08-06           MRN: [id number] Visit Date: 02/08/2023 Requested by: No referring provider defined for this encounter. PCP: Pcp, No  Subjective: Chief Complaint  Patient presents with   Right Shoulder - Pain   Right Elbow - Pain    HPI: Kelly Dougherty is a 82 y.o. female who presents to the office reporting right shoulder pain.  Also has some elbow pain.  She is fallen several times but most recently fell about 2 years ago on the right shoulder.  She is right-hand dominant.  Pain does occasionally wake if she rolls onto the right-hand side.  Reports difficulty with ADLs along with decreased range of motion.  Has radicular pain to the elbow.  Shoulder does hurt with eating and driving.  When she comes down from abduction her symptoms are worse.  She remembers a discrete fall in December 2021.  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  She is on Eliquis.  Tylenol at night helps..                ROS: All systems reviewed are negative as they relate to the chief complaint within the history of present illness.  Patient denies fevers or chills.  Assessment & Plan: Visit Diagnoses:  1. Right arm pain     Plan: Impression is right shoulder and elbow pain.  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  Not a great operative candidate based on her current medical condition.  Radiographs do show end-stage arthritis in the shoulder.  Glenohumeral joint injection performed today.  Could repeat that in 4 to 6 months if it helps.  Follow-Up Instructions: No follow-ups on file.   Orders:  Orders Placed This Encounter  Procedures   XR Shoulder Right   XR Elbow 2 Views Right   US Guided Needle Placement - No Linked Charges   No orders of the defined types were placed in this encounter.     Procedures: Large Joint Inj: R glenohumeral on 02/08/2023 8:21 PM Indications:  diagnostic evaluation and pain Details: 18 G 1.5 in needle, posterior approach  Arthrogram: No  Medications: 9 mL bupivacaine 0.5 %; 40 mg methylPREDNISolone acetate 40 MG/ML; 5 mL lidocaine 1 % Outcome: tolerated well, no immediate complications Procedure, treatment alternatives, risks and benefits explained, specific risks discussed. Consent was given by the patient. Immediately prior to procedure a time out was called to verify the correct patient, procedure, equipment, support staff and site/side marked as required. Patient was prepped and draped in the usual sterile fashion.       Clinical Data: No additional findings.  Objective: Vital Signs: There were no vitals taken for this visit.  Physical Exam:  Constitutional: Patient appears well-developed HEENT:  Head: Normocephalic Eyes:EOM are normal Neck: Normal range of motion Cardiovascular: Normal rate Pulmonary/chest: Effort normal Neurologic: Patient is alert Skin: Skin is warm Psychiatric: Patient has normal mood and affect  Ortho Exam: Ortho exam demonstrates range of motion on the right of 20/50/85.  Deltoid is functional.  Motor or sensory function of the hand is intact.  Radial pulse intact.  No masses lymphadenopathy or skin changes noted in that right shoulder girdle region.  Specialty Comments:  No specialty comments available.  Imaging: No results found.   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   Glaucoma    Chronic atrial fibrillation (HCC)    DOE (dyspnea on exertion)    Cardiomyopathy ([place]) 08/06/2015   Past Medical History:  Diagnosis Date   Arthritis    Atrial fibrillation ([place])    Cataract    Dysrhythmia    04/2015 with atrial  fibrillation   Glaucoma    History of echocardiogram    Echo 1/19: EF 60-65, no RWMA, normal diastolic function, mild to mod AI, MAC, mild to mod MR, mild BAE, mod TR, PASP 40    Family History  Problem Relation Age of Onset   Hearing loss Mother    Alzheimer's disease Father     Past Surgical History:  Procedure Laterality Date   A-FLUTTER ABLATION N/A 10/02/2019   Procedure: A-FLUTTER ABLATION;  Surgeon: Evans Lance, MD;  Location: [facility];  Service: Cardiovascular;  Laterality: N/A;   APPENDECTOMY     82 years of age   CARDIOVERSION N/A 10/03/2015   Procedure: CARDIOVERSION;  Surgeon: Herminio Commons, MD;  Location: [facility];  Service: Cardiovascular;  Laterality: N/A;   COLONOSCOPY N/A 07/30/2016   Procedure: COLONOSCOPY;  Surgeon: Rogene Houston, MD;  [facility];  Service: Endoscopy;  Laterality: N/A;  8:30   EYE SURGERY     left   HEMORROIDECTOMY     TONSILLECTOMY     childhood   Social History   Occupational History   Not on file  Tobacco Use   Smoking status: Never   Smokeless tobacco: Never  Vaping Use   Vaping Use: Never used  Substance and Sexual Activity   Alcohol use: Yes    Alcohol/week: 7.0 standard drinks of alcohol    Types: 7 Glasses of wine per week    Comment: one glass of wine with dinner each night   Drug use: No   Sexual activity: Not Currently

## 2023-04-19 ENCOUNTER — Other Ambulatory Visit: Payer: Self-pay | Admitting: Internal Medicine

## 2023-04-19 ENCOUNTER — Encounter: Payer: Self-pay | Admitting: Family Medicine

## 2023-04-19 ENCOUNTER — Other Ambulatory Visit: Payer: Self-pay

## 2023-04-19 ENCOUNTER — Ambulatory Visit: Payer: Medicare PPO | Admitting: Family Medicine

## 2023-04-19 VITALS — BP 118/78 | HR 72 | Temp 97.5°F | Ht 64.0 in | Wt 116.0 lb

## 2023-04-19 DIAGNOSIS — R300 Dysuria: Secondary | ICD-10-CM

## 2023-04-19 DIAGNOSIS — E785 Hyperlipidemia, unspecified: Secondary | ICD-10-CM

## 2023-04-19 DIAGNOSIS — I482 Chronic atrial fibrillation, unspecified: Secondary | ICD-10-CM | POA: Diagnosis not present

## 2023-04-19 DIAGNOSIS — R413 Other amnesia: Secondary | ICD-10-CM | POA: Diagnosis not present

## 2023-04-19 DIAGNOSIS — N3001 Acute cystitis with hematuria: Secondary | ICD-10-CM

## 2023-04-19 LAB — URINALYSIS, ROUTINE W REFLEX MICROSCOPIC
Bilirubin Urine: NEGATIVE
Casts: NONE SEEN /LPF
Crystals: NONE SEEN /HPF
Glucose, UA: NEGATIVE
Hyaline Cast: NONE SEEN /LPF
Ketones, ur: NEGATIVE
Nitrite: NEGATIVE
Specific Gravity, Urine: 1.016 (ref 1.001–1.035)
WBC, UA: >=60 /HPF — AB (ref 0–5)
Yeast: NONE SEEN /HPF
pH: 6 (ref 5.0–8.0)

## 2023-04-19 LAB — MICROSCOPIC MESSAGE

## 2023-04-19 MED ORDER — CEPHALEXIN 500 MG PO CAPS: 500.0000 mg | ORAL_CAPSULE | Freq: Two times a day (BID) | ORAL | 0 refills | Status: DC

## 2023-04-19 NOTE — Assessment & Plan Note (Addendum)
MMSE 26. Will obtain MRI. She declines Neurology referral at this time. Will follow up ASAP for memory loss evaluation with her husband to discuss further. No red flags on exam.

## 2023-04-19 NOTE — Patient Instructions (Signed)
It was great to meet you today and I'm excited to have you join the [facility] practice. I hope you had a positive experience today! If you feel so inclined, please feel free to recommend our practice to friends and family. Aubrey Blackard, FNP-C  

## 2023-04-19 NOTE — Assessment & Plan Note (Signed)
Followed by cardiology, rate controlled. Continue Eliquis 2.5mg  BID, Digoxin 0.125mg  daily Mon to Sat, and Metoprolol  daily.

## 2023-04-19 NOTE — Progress Notes (Signed)
New Patient Office Visit  Subjective    Patient ID: Kelly Dougherty, female    DOB: 06/22/1941  Age: 82 y.o. MRN: [id number]  CC:  Chief Complaint  Patient presents with   Establish Care    HPI Kelly Dougherty presents to establish care. Oriented to practice routines and expectations.PMH significant for arthritis, afib on Eliquis and Metoprolol, cataracts/glaucoma. She does have a Development worker, international aid, Ophthalmologist, and Dermatologist. Concerns today include forgetfulness and confusion with family history of alzheimer's. She cannot recall the day or month, her husband does remind her daily the date and appointments, she does misplace her telephone frequently and asks questions repetitively. She reports a slow decline over a couple of months. Denies vision changes, weakness, difficulty speaking. She did have a CT scan 12/06/2020 that showed lacunar infarcts, chronic microvascular ischemic disease, and generalized cerebral atrophy.  She also endorses dysuria and urinary frequency and urgency. Denies fever, abdominal pain, flank pain, blood in urine.     Outpatient Encounter Medications as of 04/19/2023  Medication Sig   acetaminophen (TYLENOL) 500 MG tablet Take 1 tablet (500 mg total) by mouth every 6 (six) hours as needed.   apixaban (ELIQUIS) 2.5 MG TABS tablet Take 1 tablet by mouth twice daily   calcium-vitamin D (OSCAL WITH D) 500-200 MG-UNIT tablet Take 1 tablet by mouth 2 (two) times daily.    cephALEXin (KEFLEX) 500 MG capsule Take 1 capsule (500 mg total) by mouth every 12 (twelve) hours.   digoxin (LANOXIN) 0.125 MG tablet TAKE 1 TABLET BY MOUTH ONCE DAILY ON MONDAY THROUGH SATURDAY. NONE ON  years of age   CARDIOVERSION N/A 10/03/2015   Procedure: CARDIOVERSION;  Surgeon: Laqueta Linden, MD;  Location: [facility];  Service: Cardiovascular;  Laterality: N/A;   COLONOSCOPY N/A 07/30/2016   Procedure: COLONOSCOPY;  Surgeon: Malissa Hippo, MD;  Location: [facility];  Service: Endoscopy;  Laterality: N/A;  8:30   EYE SURGERY     left   HEMORROIDECTOMY     TONSILLECTOMY     childhood    Family History  Problem Relation Age of Onset   Hearing loss Mother    Alzheimer's disease Father     Social History   Socioeconomic History   Marital status: Married    Spouse name: Not on file    Number of children: Not on file   Years of education: Not on file   Highest education level: Not on file  Occupational History   Not on file  Tobacco Use   Smoking status: Never   Smokeless tobacco: Never  Vaping Use   Vaping Use: Never used  Substance and Sexual Activity   Alcohol use: Yes    Alcohol/week: 7.0 standard drinks of alcohol    Types: 7 Glasses of wine per week    Comment: one glass of wine with dinner each night   Drug use: No   Sexual activity: Not Currently  Other Topics Concern   Not on file  Social History Narrative   Not on file   Social Determinants of Health   Financial Resource Strain: Not on file  Food Insecurity: Not on file  Transportation Needs: Not on file  Physical Activity: Not on file  Stress: Not on file  Social Connections: Not on file  Intimate Partner Violence: Not on file    Review of Systems  Respiratory: Negative.    Cardiovascular: Negative.   Gastrointestinal: Negative.   Genitourinary:  Positive for dysuria.  Neurological: Negative.   Psychiatric/Behavioral:  Positive for memory loss.         Objective    BP 118/78   Pulse 72   Temp (!) 97.5 F (36.4 C) (Oral)   Ht 5\' 4"  (1.626 m)   Wt 116 lb (52.6 kg)   SpO2 99%   BMI 19.91 kg/m   Physical Exam Vitals and nursing note reviewed.  Constitutional:      Appearance: Normal appearance. She is normal weight.  HENT:     Head: Normocephalic and atraumatic.  Cardiovascular:     Rate and Rhythm: Normal rate. Rhythm irregular.     Pulses: Normal pulses.     Heart sounds: Normal heart sounds.  Pulmonary:     Effort: Pulmonary effort is normal.     Breath sounds: Normal breath sounds.  Skin:    General: Skin is warm and dry.  Neurological:     General: No focal deficit present.     Mental Status: She is alert and oriented to person, place, and time. Mental status is at baseline.  Psychiatric:        Mood and Affect: Mood normal.        Behavior: Behavior normal.         Thought Content: Thought content normal.        Judgment: Judgment normal.         Assessment & Plan:   Problem List Items Addressed This Visit     Chronic atrial fibrillation    Followed by cardiology, rate controlled. Continue Eliquis 2.5mg  BID, Digoxin 0.125mg  daily Mon to Sat, and Metoprolol 50mg  daily.      Relevant Orders   CBC with Differential/Platelet   COMPLETE  METABOLIC PANEL WITH GFR   Mild hyperlipidemia - Primary   Relevant Orders   Lipid panel   Acute cystitis with hematuria    Urine dipstick shows positive for RBC's, positive for protein, and positive for leukocytes.  Micro exam: >60 WBC's per HPF, 40-60 RBC's per HPF, and many+ bacteria. Start Keflex  every 12 hours for 7 days. Return to office if symptoms worsen or fail to improve.       Memory impairment of gradual onset    MMSE 26. Will obtain MRI. She declines Neurology referral at this time. Will follow up ASAP for memory loss evaluation with her husband to discuss further. No red flags on exam.      Relevant Orders   CBC with Differential/Platelet   COMPLETE METABOLIC PANEL WITH GFR   Lipid panel   Vitamin B12   TSH   MR Brain W Wo Contrast   Other Visit Diagnoses     Dysuria       Relevant Orders   Urinalysis, Routine w reflex microscopic (Completed)       Return for memory impairment.   Park Meo, FNP

## 2023-04-19 NOTE — Assessment & Plan Note (Addendum)
Urine dipstick shows positive for RBC's, positive for protein, and positive for leukocytes.  Micro exam: >60 WBC's per HPF, 40-60 RBC's per HPF, and many+ bacteria. Start Keflex  every 12 hours for 7 days. Return to office if symptoms worsen or fail to improve.

## 2023-04-20 LAB — LIPID PANEL
Cholesterol: 205 mg/dL — ABNORMAL HIGH (ref <200)
HDL: 59 mg/dL (ref >=50)
LDL Cholesterol (Calc): 125 mg/dL (calc) — ABNORMAL HIGH
Non-HDL Cholesterol (Calc): 146 mg/dL (calc) — ABNORMAL HIGH (ref <130)
Total CHOL/HDL Ratio: 3.5 (calc) (ref <5.0)
Triglycerides: 107 mg/dL (ref <150)

## 2023-04-20 LAB — CBC WITH DIFFERENTIAL/PLATELET
Absolute Monocytes: 515 cells/uL (ref 200–950)
Basophils Absolute: 29 cells/uL (ref 0–200)
Basophils Relative: 0.6 %
Eosinophils Absolute: 59 cells/uL (ref 15–500)
Eosinophils Relative: 1.2 %
HCT: 41.3 % (ref 35.0–45.0)
Hemoglobin: 13.5 g/dL (ref 11.7–15.5)
Lymphs Abs: 1446 cells/uL (ref 850–3900)
MCH: 30.3 pg (ref 27.0–33.0)
MCHC: 32.7 g/dL (ref 32.0–36.0)
MCV: 92.6 fL (ref 80.0–100.0)
MPV: 10.1 fL (ref 7.5–12.5)
Monocytes Relative: 10.5 %
Neutro Abs: 2852 cells/uL (ref 1500–7800)
Neutrophils Relative %: 58.2 %
Platelets: 218 10*3/uL (ref 140–400)
RBC: 4.46 10*6/uL (ref 3.80–5.10)
RDW: 12.5 % (ref 11.0–15.0)
Total Lymphocyte: 29.5 %
WBC: 4.9 10*3/uL (ref 3.8–10.8)

## 2023-04-20 LAB — COMPLETE METABOLIC PANEL WITH GFR
AG Ratio: 1.5 (calc) (ref 1.0–2.5)
ALT: 11 U/L (ref 6–29)
AST: 21 U/L (ref 10–35)
Albumin: 4.3 g/dL (ref 3.6–5.1)
Alkaline phosphatase (APISO): 99 U/L (ref 37–153)
BUN: 9 mg/dL (ref 7–25)
CO2: 28 mmol/L (ref 20–32)
Calcium: 9.8 mg/dL (ref 8.6–10.4)
Chloride: 105 mmol/L (ref 98–110)
Creat: 0.74 mg/dL (ref 0.60–0.95)
Globulin: 2.8 g/dL (calc) (ref 1.9–3.7)
Glucose, Bld: 100 mg/dL — ABNORMAL HIGH (ref 65–99)
Potassium: 4.7 mmol/L (ref 3.5–5.3)
Sodium: 141 mmol/L (ref 135–146)
Total Bilirubin: 0.7 mg/dL (ref 0.2–1.2)
Total Protein: 7.1 g/dL (ref 6.1–8.1)
eGFR: 80 mL/min/{1.73_m2} (ref >=60)

## 2023-04-20 LAB — URINE CULTURE
MICRO NUMBER:: 14855776
Result:: NO GROWTH
SPECIMEN QUALITY:: ADEQUATE

## 2023-04-20 LAB — VITAMIN B12: Vitamin B-12: 159 pg/mL — ABNORMAL LOW (ref 200–1100)

## 2023-04-20 LAB — TSH: TSH: 1.83 mIU/L (ref 0.40–4.50)

## 2023-04-28 ENCOUNTER — Other Ambulatory Visit: Payer: Self-pay | Admitting: Internal Medicine

## 2023-04-28 DIAGNOSIS — I482 Chronic atrial fibrillation, unspecified: Secondary | ICD-10-CM

## 2023-04-28 NOTE — Telephone Encounter (Signed)
Prescription refill request for Eliquis received. Indication: PAF Last office visit: 11/24/22  Rosette Reveal MD Scr: 0.74 on 04/19/23 Age: 82 Weight: 54.9kg  Based on above findings Eliquis 2.5mg  twice daily is the appropriate dose.  Refill approved.

## 2023-05-03 ENCOUNTER — Ambulatory Visit: Payer: Medicare PPO | Admitting: Family Medicine

## 2023-05-06 ENCOUNTER — Encounter: Payer: Self-pay | Admitting: Family Medicine

## 2023-05-06 ENCOUNTER — Ambulatory Visit (INDEPENDENT_AMBULATORY_CARE_PROVIDER_SITE_OTHER): Payer: Medicare PPO | Admitting: Family Medicine

## 2023-05-06 VITALS — BP 102/70 | HR 59 | Temp 97.7°F | Ht 64.0 in | Wt 115.0 lb

## 2023-05-06 DIAGNOSIS — H6192 Disorder of left external ear, unspecified: Secondary | ICD-10-CM

## 2023-05-06 DIAGNOSIS — R413 Other amnesia: Secondary | ICD-10-CM | POA: Diagnosis not present

## 2023-05-06 DIAGNOSIS — E538 Deficiency of other specified B group vitamins: Secondary | ICD-10-CM

## 2023-05-06 DIAGNOSIS — Z0000 Encounter for general adult medical examination without abnormal findings: Secondary | ICD-10-CM

## 2023-05-06 NOTE — Progress Notes (Signed)
Acute Office Visit  Subjective:     Patient ID: Kelly Dougherty, female    DOB: [birth year], 82 y.o.   MRN: [id number]  Chief Complaint  Patient presents with   Follow-up    2 wk f/u med/UTI    HPI Patient is in today for follow up on labs, memory, and UTI. Her UTI has resolved and she completed most of her antibiotics, counseled on medication compliance. She reports unchanged memory, forgetfulness late in the afternoon, unable to recall the date and temporarily forgets major events including her mothers passing. Has MRI scheduled for 5/15.    Review of Systems  All other systems reviewed and are negative.   Past Medical History:  Diagnosis Date   Arthritis    Atrial fibrillation [facility])    Cataract    Dysrhythmia    04/2015 with atrial fibrillation   Glaucoma    History of echocardiogram    Echo 1/19: EF 60-65, no RWMA, normal diastolic function, mild to mod AI, MAC, mild to mod MR, mild BAE, mod TR, PASP 40   Past Surgical History:  Procedure Laterality Date   A-FLUTTER ABLATION N/A 10/02/2019   Procedure: A-FLUTTER ABLATION;  Surgeon: Marinus Maw, MD;  [facility];  Service: Cardiovascular;  Laterality: N/A;   APPENDECTOMY     82 years of age   CARDIOVERSION N/A 10/03/2015   Procedure: CARDIOVERSION;  Surgeon: Laqueta Linden, MD;  Location: [facility];  Service: Cardiovascular;  Laterality: N/A;   COLONOSCOPY N/A 07/30/2016   Procedure: COLONOSCOPY;  Surgeon: Malissa Hippo, MD;  Location: [facility];  Service: Endoscopy;  Laterality: N/A;  8:30   EYE SURGERY     left   HEMORROIDECTOMY     TONSILLECTOMY     childhood   Current Outpatient Medications on File Prior to Visit  Medication Sig Dispense Refill   acetaminophen (TYLENOL) 500 MG tablet Take 1 tablet (500 mg total) by mouth every 6 (six) hours as needed. 30 tablet 0   apixaban (ELIQUIS) 2.5 MG TABS tablet Take 1 tablet by mouth twice daily 180 tablet 1   calcium-vitamin D (OSCAL WITH D)  500-200 MG-UNIT tablet Take 1 tablet by mouth 2 (two) times daily.      cephALEXin (KEFLEX) 500 MG capsule Take 1 capsule (500 mg total) by mouth every 12 (twelve) hours. 14 capsule 0   digoxin (LANOXIN) 0.125 MG tablet TAKE 1 TABLET BY MOUTH ONCE DAILY ON  MONDAY  THROUGH  SATURDAY.  NONE  ON  SUNDAY 90 tablet 1   Docusate Calcium (STOOL SOFTENER PO) Take 100 mg by mouth daily.      furosemide (LASIX) 20 MG tablet TAKE 1 TABLET BY MOUTH DAILY AS NEEDED FOR SWELLING (Patient taking differently: Take 20 mg by mouth daily as needed for edema.) 90 tablet 3   latanoprost (XALATAN) 0.005 % ophthalmic solution Place 1 drop into both eyes at bedtime.     metoprolol succinate (TOPROL-XL) 50 MG 24 hr tablet Take 1 tablet (50 mg total) by mouth daily. Take with or immediately following a meal. 90 tablet 3   Netarsudil Dimesylate 0.02 % SOLN Apply to eye.     potassium chloride (K-DUR,KLOR-CON) 10 MEQ tablet TAKE 1 TABLET BY MOUTH ONLY ON DAYS YOUR TAKING LASIX FOR SWELLING (Patient taking differently: Take 10 mEq by mouth daily as needed. TAKE 10 meqTABLET BY MOUTH ONLY ON DAYS YOUR TAKING LASIX FOR SWELLING) 90 tablet 3   timolol (  BETIMOL) 0.5 % ophthalmic solution Place 1 drop into the left eye 2 (two) times daily.     timolol (TIMOPTIC) 0.5 % ophthalmic solution Place 1 drop into the left eye 2 (two) times daily.     No current facility-administered medications on file prior to visit.   No Known Allergies      Objective:    BP 102/70   Pulse (!) 59   Temp 97.7 F (36.5 C) (Oral)   Ht 5\' 4"  (1.626 m)   Wt 115 lb (52.2 kg)   SpO2 99%   BMI 19.74 kg/m    Physical Exam Vitals and nursing note reviewed.  Constitutional:      Appearance: Normal appearance. She is normal weight.  HENT:     Head: Normocephalic and atraumatic.  Cardiovascular:     Rate and Rhythm: Normal rate. Rhythm irregular.     Pulses: Normal pulses.     Heart sounds: Normal heart sounds.  Pulmonary:     Effort:  Pulmonary effort is normal.     Breath sounds: Normal breath sounds.  Skin:    General: Skin is warm and dry.     Findings: Lesion present.       Neurological:     General: No focal deficit present.     Mental Status: She is alert and oriented to person, place, and time. Mental status is at baseline.  Psychiatric:        Mood and Affect: Mood normal.        Behavior: Behavior normal.        Thought Content: Thought content normal.        Judgment: Judgment normal.     No results found for any visits on 05/06/23.      Assessment & Plan:   Problem List Items Addressed This Visit     Memory impairment of gradual onset - Primary    Awaiting MRI. She declines Neurology referral at this time. Her husband does not notice worsening memory however he does assist her with things like medications and remembering appointments. No red flags on exam.      B12 deficiency    Does not eat red meat, no anemia on labs. Start Vitamin B12 500-1,000 mcg daily.      Skin lesion of left external ear    Patient unable to reach her old dermatologist. Referral placed.      Relevant Orders   Ambulatory referral to Dermatology    No orders of the defined types were placed in this encounter.   Return in about 6 months (around 11/06/2023).  Park Meo, FNP

## 2023-05-06 NOTE — Addendum Note (Signed)
Addended by: Park Meo on: 05/06/2023 11:17 AM   Modules accepted: Level of Service

## 2023-05-06 NOTE — Assessment & Plan Note (Signed)
Awaiting MRI. She declines Neurology referral at this time. Her husband does not notice worsening memory however he does assist her with things like medications and remembering appointments. No red flags on exam.

## 2023-05-06 NOTE — Assessment & Plan Note (Addendum)
Does not eat red meat, no anemia on labs. Start Vitamin B12 500-1,000 mcg daily.

## 2023-05-06 NOTE — Progress Notes (Signed)
Subjective:   Kelly Dougherty is a 82 y.o. female who presents for Medicare Annual (Subsequent) preventive examination.  Review of Systems     Cardiac Risk Factors include: advanced age (>70men, >2 women);sedentary lifestyle     Objective:    There were no vitals filed for this visit. There is no height or weight on file to calculate BMI.     05/06/2023   10:30 AM 01/02/2021    6:48 PM 12/06/2020   11:56 AM 10/02/2019    6:02 AM 01/09/2019    8:38 AM 07/30/2016    7:29 AM 10/03/2015    7:49 AM  Advanced Directives  Does Patient Have a Medical Advance Directive? Yes No No No No No No  Type of Advance Directive Healthcare Power of Attorney        Would patient like information on creating a medical advance directive?  No - Patient declined  No - Patient declined Yes (MAU/Ambulatory/Procedural Areas - Information given) No - patient declined information No - patient declined information    Current Medications (verified) Outpatient Encounter Medications as of 05/06/2023  Medication Sig   acetaminophen (TYLENOL) 500 MG tablet Take 1 tablet (500 mg total) by mouth every 6 (six) hours as needed.   apixaban (ELIQUIS) 2.5 MG TABS tablet Take 1 tablet by mouth twice daily   calcium-vitamin D (OSCAL WITH D) 500-200 MG-UNIT tablet Take 1 tablet by mouth 2 (two) times daily.    cephALEXin (KEFLEX) 500 MG capsule Take 1 capsule (500 mg total) by mouth every 12 (twelve) hours.   digoxin (LANOXIN) 0.125 MG tablet TAKE 1 TABLET BY MOUTH ONCE DAILY ON  MONDAY  THROUGH  SATURDAY.  NONE  ON  SUNDAY   Docusate Calcium (STOOL SOFTENER PO) Take 100 mg by mouth daily.    furosemide (LASIX) 20 MG tablet TAKE 1 TABLET BY MOUTH DAILY AS NEEDED FOR SWELLING (Patient taking differently: Take 20 mg by mouth daily as needed for edema.)   latanoprost (XALATAN) 0.005 % ophthalmic solution Place 1 drop into both eyes at bedtime.   metoprolol succinate (TOPROL-XL) 50 MG 24 hr tablet Take 1 tablet (50 mg total) by  mouth daily. Take with or immediately following a meal.   Netarsudil Dimesylate 0.02 % SOLN Apply to eye.   potassium chloride (K-DUR,KLOR-CON) 10 MEQ tablet TAKE 1 TABLET BY MOUTH ONLY ON DAYS YOUR TAKING LASIX FOR SWELLING (Patient taking differently: Take 10 mEq by mouth daily as needed. TAKE 10 meqTABLET BY MOUTH ONLY ON DAYS YOUR TAKING LASIX FOR SWELLING)   timolol (BETIMOL) 0.5 % ophthalmic solution Place 1 drop into the left eye 2 (two) times daily.   timolol (TIMOPTIC) 0.5 % ophthalmic solution Place 1 drop into the left eye 2 (two) times daily.   No facility-administered encounter medications on file as of 05/06/2023.    Allergies (verified) Patient has no known allergies.   History: Past Medical History:  Diagnosis Date   Arthritis    Atrial fibrillation (HCC)    Cataract    Dysrhythmia    04/2015 with atrial fibrillation   Glaucoma    History of echocardiogram    Echo 1/19: EF 60-65, no RWMA, normal diastolic function, mild to mod AI, MAC, mild to mod MR, mild BAE, mod TR, PASP 40   Past Surgical History:  Procedure Laterality Date   A-FLUTTER ABLATION N/A 10/02/2019   Procedure: A-FLUTTER ABLATION;  Surgeon: Marinus Maw, MD;  [facility];  Service:  Cardiovascular;  Laterality: N/A;   APPENDECTOMY     82 years of age   CARDIOVERSION N/A 10/03/2015   Procedure: CARDIOVERSION;  Surgeon: Laqueta Linden, MD;  Location: [facility];  Service: Cardiovascular;  Laterality: N/A;   COLONOSCOPY N/A 07/30/2016   Procedure: COLONOSCOPY;  Surgeon: Malissa Hippo, MD;  [facility];  Service: Endoscopy;  Laterality: N/A;  8:30   EYE SURGERY     left   HEMORROIDECTOMY     TONSILLECTOMY     childhood   Family History  Problem Relation Age of Onset   Hearing loss Mother    Alzheimer's disease Father    Social History   Socioeconomic History   Marital status: Married    Spouse name: Not on file   Number of children: Not on file   Years of  education: Not on file   Highest education level: Not on file  Occupational History   Not on file  Tobacco Use   Smoking status: Never   Smokeless tobacco: Never  Vaping Use   Vaping Use: Never used  Substance and Sexual Activity   Alcohol use: Yes    Alcohol/week: 7.0 standard drinks of alcohol    Types: 7 Glasses of wine per week    Comment: one glass of wine with dinner each night   Drug use: No   Sexual activity: Not Currently  Other Topics Concern   Not on file  Social History Narrative   Not on file   Social Determinants of Health   Financial Resource Strain: Not on file  Food Insecurity: Not on file  Transportation Needs: Not on file  Physical Activity: Not on file  Stress: Not on file  Social Connections: Not on file    Tobacco Counseling Counseling given: Not Answered   Clinical Intake:  Pre-visit preparation completed: Yes  Pain : No/denies pain     Diabetes: No  How often do you need to have someone help you when you read instructions, pamphlets, or other written materials from your doctor or pharmacy?: 1 - Never What is the last grade level you completed in school?: HS  Diabetic?no  Interpreter Needed?: No      Activities of Daily Living    05/06/2023   10:34 AM  In your present state of health, do you have any difficulty performing the following activities:  Hearing? 0  Vision? 0  Difficulty concentrating or making decisions? 0  Walking or climbing stairs? 0  Comment just have to be very careful  Dressing or bathing? 0  Doing errands, shopping? 1  Comment pt can't drive  Preparing Food and eating ? N  Using the Toilet? N  In the past six months, have you accidently leaked urine? Y  Do you have problems with loss of bowel control? N  Managing your Medications? Y  Comment husband organize the meds in little pill boxes and pt takes them herself  Managing your Finances? Y  Housekeeping or managing your Housekeeping? Y    Patient  Care Team: Park Meo, FNP as PCP - General (Family Medicine)  Indicate any recent Medical Services you may have received from other than [facility] providers in the past year (date may be approximate).     Assessment:   This is a routine wellness examination for [place].  Hearing/Vision screen No results found.  Dietary issues and exercise activities discussed: Current Exercise Habits: The patient does not participate in regular exercise at present (just walking),  Exercise limited by: cardiac condition(s);neurologic condition(s)   Goals Addressed             This Visit's Progress    Eat Healthy       Pt would like to walk more       Depression Screen    04/19/2023   10:07 AM 02/27/2020    2:10 PM 12/26/2019    9:39 AM 01/09/2019    8:37 AM 01/05/2018    8:15 AM 03/12/2017    2:27 PM 01/04/2017    8:27 AM  PHQ 2/9 Scores  PHQ - 2 Score 1 0 0 0 0 0 0  PHQ- 9 Score 1   0 0 0 0    Fall Risk    04/19/2023   10:07 AM 02/27/2020    2:10 PM 12/26/2019    9:39 AM 01/09/2019    8:37 AM 01/05/2018    8:15 AM  Fall Risk   Falls in the past year? 0 0 0 0 No  Number falls in past yr: 0  0    Injury with Fall? 0  0    Risk for fall due to :  No Fall Risks     Follow up  Falls evaluation completed  Falls evaluation completed     FALL RISK PREVENTION PERTAINING TO THE HOME:  Any stairs in or around the home? Yes  If so, are there any without handrails? Yes  Home free of loose throw rugs in walkways, pet beds, electrical cords, etc? Yes  Adequate lighting in your home to reduce risk of falls? Yes   ASSISTIVE DEVICES UTILIZED TO PREVENT FALLS:  Life alert? No  Use of a cane, walker or w/c? Yes  Grab bars in the bathroom? Yes  Shower chair or bench in shower? No  Elevated toilet seat or a handicapped toilet? No   TIMED UP AND GO:  Was the test performed? Yes .  Length of time to ambulate 10 feet: n/a sec.   Gait slow and steady without use of assistive device  Cognitive  Function:    04/19/2023    9:52 AM  MMSE - Mini Mental State Exam  Orientation to time 4  Orientation to time comments close to lunch time  Orientation to Place 5  Registration 2  Attention/ Calculation 4  Recall 2  Recall-comments 1 wrong  Language- name 2 objects 2  Language- repeat 1  Language- follow 3 step command 3  Language- read & follow direction 1  Write a sentence 1  Copy design 1  Total score 26        Immunizations Immunization History  Administered Date(s) Administered   Influenza, High Dose Seasonal PF 09/29/2017, 10/09/2018   Influenza,inj,Quad PF,6+ Mos 10/02/2015, 09/21/2019, 10/25/2019   Influenza-Unspecified 09/19/2016, 09/21/2019   Moderna Sars-Covid-2 Vaccination 01/09/2020, 02/09/2020   Pneumococcal Conjugate-13 12/10/2015   Pneumococcal Polysaccharide-23 12/28/2012   Tdap 12/28/2012    TDAP status: Up to date  Flu Vaccine status: pt decline  Pneumococcal vaccine status: Up to date  Covid-19 vaccine status: Declined, Education has been provided regarding the importance of this vaccine but patient still declined. Advised may receive this vaccine at local pharmacy or Health Dept.or vaccine clinic. Aware to provide a copy of the vaccination record if obtained from local pharmacy or Health Dept. Verbalized acceptance and understanding.  Qualifies for Shingles Vaccine? yes  Zostavax completed No   Shingrix Completed?: No.    Education has been provided regarding the importance of  this vaccine. Patient has been advised to call insurance company to determine out of pocket expense if they have not yet received this vaccine. Advised may also receive vaccine at local pharmacy or Health Dept. Verbalized acceptance and understanding.  Screening Tests Health Maintenance  Topic Date Due   COVID-19 Vaccine (3 - Moderna risk series) 03/08/2020   Zoster Vaccines- Shingrix (1 of 2) 07/19/2023 (Originally 04/30/1960)   INFLUENZA VACCINE  07/29/2023   Medicare  Annual Wellness (AWV)  05/05/2024   Pneumonia Vaccine 62+ Years old  Completed   DEXA SCAN  Completed   HPV VACCINES  Aged Out   DTaP/Tdap/Td  Discontinued    Health Maintenance  Health Maintenance Due  Topic Date Due   COVID-19 Vaccine (3 - Moderna risk series) 03/08/2020    Colorectal cancer screening: No longer required.   Mammogram status: No longer required due to age.  Bone Density status: Completed 3/21. Results reflect: Bone density results: OSTEOPENIA. Repeat every once years.  Lung Cancer Screening: (Low Dose CT Chest recommended if Age 67-80 years, 30 pack-year currently smoking OR have quit w/in 15years.) does not qualify.   Lung Cancer Screening Referral: n/a  Additional Screening:  Hepatitis C Screening: does not qualify; Completed n/a  Vision Screening: Recommended annual ophthalmology exams for early detection of glaucoma and other disorders of the eye. Is the patient up to date with their annual eye exam?  Yes  Who is the provider or what is the name of the office in which the patient attends annual eye exams? Dr. Reina Fuse in [place] If pt is not established with a provider, would they like to be referred to a provider to establish care? No .   Dental Screening: Recommended annual dental exams for proper oral hygiene  Community Resource Referral / Chronic Care Management: CRR required this visit?  No   CCM required this visit?  No      Plan:     I have personally reviewed and noted the following in the patient's chart:   Medical and social history Use of alcohol, tobacco or illicit drugs  Current medications and supplements including opioid prescriptions. Patient is not currently taking opioid prescriptions. Functional ability and status Nutritional status Physical activity Advanced directives List of other physicians Hospitalizations, surgeries, and ER visits in previous 12 months Vitals Screenings to include cognitive, depression, and  falls Referrals and appointments  In addition, I have reviewed and discussed with patient certain preventive protocols, quality metrics, and best practice recommendations. A written personalized care plan for preventive services as well as general preventive health recommendations were provided to patient.     Arta Silence, CMA   05/06/2023   Nurse Notes: n/a

## 2023-05-06 NOTE — Assessment & Plan Note (Signed)
Patient unable to reach her old dermatologist. Referral placed.

## 2023-05-12 ENCOUNTER — Ambulatory Visit (INDEPENDENT_AMBULATORY_CARE_PROVIDER_SITE_OTHER): Payer: Medicare PPO

## 2023-05-12 ENCOUNTER — Ambulatory Visit (HOSPITAL_COMMUNITY): Admission: RE | Admit: 2023-05-12 | Payer: Medicare PPO | Source: Ambulatory Visit

## 2023-05-12 VITALS — Ht 64.0 in | Wt 115.0 lb

## 2023-05-12 DIAGNOSIS — H6123 Impacted cerumen, bilateral: Secondary | ICD-10-CM

## 2023-05-12 NOTE — Progress Notes (Signed)
Pt here to have ears flushed. Wax build-up noted bilaterally. Small amount of yellow wax removed from both ears. After flushing, I was able to visualize both ear drums. Pt tolerated procedure well. Mjp,lpn

## 2023-08-11 ENCOUNTER — Ambulatory Visit: Payer: Medicare PPO | Admitting: Orthopedic Surgery

## 2023-08-11 ENCOUNTER — Other Ambulatory Visit: Payer: Self-pay

## 2023-08-11 DIAGNOSIS — M19011 Primary osteoarthritis, right shoulder: Secondary | ICD-10-CM

## 2023-08-11 NOTE — Progress Notes (Unsigned)
Office Visit Note   Patient: Kelly Dougherty           Date of Birth: 1941/01/25           MRN: [id number] Visit Date: 08/11/2023 Requested by: No referring provider defined for this encounter. PCP: Park Meo, FNP  Subjective: Chief Complaint  Patient presents with   Right Shoulder - Follow-up    HPI: Kelly Dougherty is a 82 y.o. female who presents to the office reporting right shoulder pain.  She had a right glenohumeral joint injection which gave her about a month of relief.  Patient has had 2 strokes and takes Eliquis.  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  She would like to have the shoulder injected again today..                ROS: All systems reviewed are negative as they relate to the chief complaint within the history of present illness.  Patient denies fevers or chills.  Assessment & Plan: Visit Diagnoses:  1. Arthritis of right shoulder region     Plan: Impression is right shoulder glenohumeral joint arthritis.  Ultrasound-guided injection performed today.  Patient is not an optimal shoulder replacement candidate due to her multiple medical comorbidities.  I did discuss with Tashayla that we could do 2 or at the most 3 injections in the shoulder joint per year so long as they are helping.  She will follow-up as needed.  Follow-Up Instructions: No follow-ups on file.   Orders:  Orders Placed This Encounter  Procedures   US Guided Needle Placement - No Linked Charges   No orders of the defined types were placed in this encounter.     Procedures: Large Joint Inj: R glenohumeral on 08/11/2023 3:58 PM Indications: diagnostic evaluation and pain Details: 18 G 1.5 in needle, ultrasound-guided posterior approach  Arthrogram: No  Medications: 9 mL bupivacaine 0.5 %; 40 mg methylPREDNISolone acetate 40 MG/ML; 5 mL lidocaine 1 % Outcome: tolerated well, no immediate complications Procedure, treatment alternatives,  risks and benefits explained, specific risks discussed. Consent was given by the patient. Immediately prior to procedure a time out was called to verify the correct patient, procedure, equipment, support staff and site/side marked as required. Patient was prepped and draped in the usual sterile fashion.       Clinical Data: No additional findings.  Objective: Vital Signs: There were no vitals taken for this visit.  Physical Exam:  Constitutional: Patient appears well-developed HEENT:  Head: Normocephalic Eyes:EOM are normal Neck: Normal range of motion Cardiovascular: Normal rate Pulmonary/chest: Effort normal Neurologic: Patient is alert Skin: Skin is warm Psychiatric: Patient has normal mood and affect  Ortho Exam: Ortho exam demonstrates shoulder range of motion on the right of 30/85/110.  Deltoid is functional.  Motor or sensory function of the hand is intact.  Radial pulse intact.  Coarse grinding is present with passive range of motion of the right shoulder.  Otherwise the Ortho exam is unchanged from prior visit  Specialty Comments:  No specialty comments available.  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   Dermatitis 12/26/2019   Unspecified atrial flutter (HCC) 09/21/2019   Osteoporosis 01/09/2019   Full code status 01/09/2019   Heart murmur 01/12/2018   Mild hyperlipidemia 01/04/2017   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   Glaucoma    Chronic atrial fibrillation (HCC)    DOE (dyspnea on exertion)    Cardiomyopathy (HCC) 08/06/2015   Past  Medical History:  Diagnosis Date   Arthritis    Atrial fibrillation (HCC)    Cataract    Dysrhythmia    04/2015 with atrial fibrillation   Glaucoma    History of echocardiogram    Echo 1/19: EF 60-65, no RWMA, normal diastolic function, mild to mod AI, MAC, mild to mod MR, mild BAE, mod TR, PASP 40    Family History  Problem Relation Age of Onset   Hearing loss Mother    Alzheimer's disease Father     Past Surgical History:  Procedure Laterality Date   A-FLUTTER ABLATION N/A 10/02/2019   Procedure: A-FLUTTER ABLATION;  Surgeon: Marinus Maw, MD;  Location: [facility];  Service: Cardiovascular;  Laterality: N/A;   APPENDECTOMY     82 years of age   CARDIOVERSION N/A 10/03/2015   Procedure: CARDIOVERSION;  Surgeon: Laqueta Linden, MD;  Location: [facility];  Service: Cardiovascular;  Laterality: N/A;   COLONOSCOPY N/A 07/30/2016   Procedure: COLONOSCOPY;  Surgeon: Malissa Hippo, MD;  Location: [facility];  Service: Endoscopy;  Laterality: N/A;  8:30   EYE SURGERY     left   HEMORROIDECTOMY     TONSILLECTOMY     childhood   Social History   Occupational History   Not on file  Tobacco Use   Smoking status: Never   Smokeless tobacco: Never  Vaping Use   Vaping status: Never Used  Substance and Sexual Activity   Alcohol use: Yes    Alcohol/week: 7.0 standard drinks of alcohol    Types: 7 Glasses of wine per week    Comment: one glass of wine with dinner each night   Drug use: No   Sexual activity: Not Currently

## 2023-08-12 ENCOUNTER — Encounter: Payer: Self-pay | Admitting: Orthopedic Surgery

## 2023-08-12 MED ORDER — METHYLPREDNISOLONE ACETATE 40 MG/ML IJ SUSP
40.0000 mg | INTRAMUSCULAR | Status: AC | PRN
Start: 2023-08-11 — End: 2023-08-11
  Administered 2023-08-11: 40 mg via INTRA_ARTICULAR

## 2023-08-12 MED ORDER — BUPIVACAINE HCL 0.5 % IJ SOLN
9.0000 mL | INTRAMUSCULAR | Status: AC | PRN
Start: 2023-08-11 — End: 2023-08-11
  Administered 2023-08-11: 9 mL via INTRA_ARTICULAR

## 2023-08-12 MED ORDER — LIDOCAINE HCL 1 % IJ SOLN
5.0000 mL | INTRAMUSCULAR | Status: AC | PRN
Start: 2023-08-11 — End: 2023-08-11
  Administered 2023-08-11: 5 mL

## 2023-10-02 ENCOUNTER — Other Ambulatory Visit: Payer: Self-pay | Admitting: Family Medicine

## 2023-10-02 DIAGNOSIS — R300 Dysuria: Secondary | ICD-10-CM

## 2023-10-03 ENCOUNTER — Ambulatory Visit
Admission: EM | Admit: 2023-10-03 | Discharge: 2023-10-03 | Disposition: A | Discharge status: Home / Self Care | Payer: Medicare PPO | Attending: Family Medicine | Admitting: Family Medicine

## 2023-10-03 DIAGNOSIS — N390 Urinary tract infection, site not specified: Secondary | ICD-10-CM | POA: Diagnosis not present

## 2023-10-03 LAB — POCT URINALYSIS DIP (MANUAL ENTRY)
Bilirubin, UA: NEGATIVE
Glucose, UA: NEGATIVE mg/dL
Ketones, POC UA: NEGATIVE mg/dL
Nitrite, UA: NEGATIVE
Protein Ur, POC: NEGATIVE mg/dL
Spec Grav, UA: 1.01 (ref 1.010–1.025)
Urobilinogen, UA: 0.2 U/dL
pH, UA: 7 (ref 5.0–8.0)

## 2023-10-03 MED ORDER — CEPHALEXIN 500 MG PO CAPS: 500.0000 mg | ORAL_CAPSULE | Freq: Two times a day (BID) | ORAL | 0 refills | Status: DC

## 2023-10-03 NOTE — ED Triage Notes (Signed)
Pt reports burning with urination and frequent urination x 1 week.

## 2023-10-03 NOTE — ED Provider Notes (Signed)
[facility]    CSN: [id number] Arrival date & time: 10/03/23  0809      History   Chief Complaint No chief complaint on file.   HPI Kelly Dougherty is a 82 y.o. female.   Patient presenting today with 1 week history of dysuria, urinary frequency.  Denies fever, chills, nausea, vomiting, flank pain, hematuria.  So far not tried anything over-the-counter for symptoms.  History of urinary tract infections in the past that have felt similar.    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   Dermatitis 12/26/2019   Unspecified atrial flutter (HCC) 09/21/2019   Osteoporosis 01/09/2019   Full code status 01/09/2019   Heart murmur 01/12/2018   Mild hyperlipidemia 01/04/2017   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   Glaucoma    Chronic atrial fibrillation (HCC)    DOE (dyspnea on exertion)    Cardiomyopathy (HCC) 08/06/2015    Past Surgical History:  Procedure Laterality Date   A-FLUTTER ABLATION N/A 10/02/2019   Procedure: A-FLUTTER ABLATION;  Surgeon: Marinus Maw, MD;  Location: [facility];  Service: Cardiovascular;  Laterality: N/A;   APPENDECTOMY     82 years of age   CARDIOVERSION N/A 10/03/2015   Procedure: CARDIOVERSION;  Surgeon: Laqueta Linden, MD;  Location: [facility];  Service: Cardiovascular;  Laterality: N/A;   COLONOSCOPY N/A 07/30/2016   Procedure: COLONOSCOPY;  Surgeon: Malissa Hippo, MD;  Location: [facility];  Service: Endoscopy;  Laterality: N/A;   8:30   EYE SURGERY     left   HEMORROIDECTOMY     TONSILLECTOMY     childhood    OB History   No obstetric history on file.      Home Medications    Prior to Admission medications   Medication Sig Start Date End Date Taking? Authorizing Provider  cephALEXin (KEFLEX) 500 MG capsule Take 1 capsule (500 mg total) by mouth 2 (two) times daily. 10/03/23  Yes Particia Nearing, PA-C  acetaminophen (TYLENOL) 500 MG tablet Take 1 tablet (500 mg total) by mouth every 6 (six) hours as needed. 12/06/20   Lorelee New, PA-C  apixaban (ELIQUIS) 2.5 MG TABS tablet Take 1 tablet by mouth twice daily 04/28/23   Marinus Maw, MD  calcium-vitamin D (OSCAL WITH D) 500-200 MG-UNIT tablet Take 1 tablet by mouth 2 (two) times daily.     [provider]  cephALEXin (KEFLEX) 500 MG capsule Take 1 capsule (500 mg total) by mouth every 12 (twelve) hours. 04/19/23   Park Meo, FNP  digoxin (LANOXIN) 0.125 MG tablet TAKE 1 TABLET BY MOUTH ONCE DAILY ON  MONDAY  THROUGH  SATURDAY.  NONE  ON  SUNDAY 04/20/23   Marinus Maw, MD  Docusate Calcium (STOOL SOFTENER PO) Take 100 mg by mouth daily.     [provider]  furosemide (LASIX) 20 MG  tablet TAKE 1 TABLET BY MOUTH DAILY AS NEEDED FOR SWELLING Patient taking differently: Take 20 mg by mouth daily as needed for edema. 03/03/19   Laqueta Linden, MD  latanoprost (XALATAN) 0.005 % ophthalmic solution Place 1 drop into both eyes at bedtime.    [provider]  metoprolol succinate (TOPROL-XL) 50 MG 24 hr tablet Take 1 tablet (50 mg total) by mouth daily. Take with or immediately following a meal. 12/16/22   Marinus Maw, MD  Netarsudil Dimesylate 0.02 % SOLN Apply to eye. 07/31/20   [provider]  potassium chloride (K-DUR,KLOR-CON) 10 MEQ tablet TAKE 1 TABLET BY MOUTH ONLY ON DAYS YOUR TAKING LASIX FOR SWELLING Patient taking differently: Take 10 mEq by mouth daily as needed. TAKE 10 meqTABLET BY MOUTH ONLY ON  DAYS YOUR TAKING LASIX FOR SWELLING 03/03/19   Laqueta Linden, MD  timolol (BETIMOL) 0.5 % ophthalmic solution Place 1 drop into the left eye 2 (two) times daily.    [provider]  timolol (TIMOPTIC) 0.5 % ophthalmic solution Place 1 drop into the left eye 2 (two) times daily. 10/25/19   [provider]    Family History Family History  Problem Relation Age of Onset   Hearing loss Mother    Alzheimer's disease Father     Social History Social History   Tobacco Use   Smoking status: Never   Smokeless tobacco: Never  Vaping Use   Vaping status: Never Used  Substance Use Topics   Alcohol use: Yes    Alcohol/week: 7.0 standard drinks of alcohol    Types: 7 Glasses of wine per week    Comment: one glass of wine with dinner each night   Drug use: No     Allergies   Patient has no known allergies.   Review of Systems Review of Systems Per HPI  Physical Exam Triage Vital Signs ED Triage Vitals [10/03/23 0817]  Encounter Vitals Group     BP 129/82     Systolic BP Percentile      Diastolic BP Percentile      Pulse Rate 65     Resp 16     Temp 97.9 F (36.6 C)     Temp Source Oral     SpO2 95 %     Weight      Height      Head Circumference      Peak Flow      Pain Score 0     Pain Loc      Pain Education      Exclude from Growth Chart    No data found.  Updated Vital Signs BP 129/82 (BP Location: Right Arm)   Pulse 65   Temp 97.9 F (36.6 C) (Oral)   Resp 16   SpO2 95%   Visual Acuity Right Eye Distance:   Left Eye Distance:   Bilateral Distance:    Right Eye Near:   Left Eye Near:    Bilateral Near:     Physical Exam Vitals and nursing note reviewed.  Constitutional:      Appearance: Normal appearance. She is not ill-appearing.  HENT:     Head: Atraumatic.  Eyes:     Extraocular Movements: Extraocular movements intact.     Conjunctiva/sclera: Conjunctivae normal.  Cardiovascular:     Rate and Rhythm: Normal rate  and regular rhythm.     Heart sounds: Normal heart sounds.  Pulmonary:     Effort: Pulmonary  effort is normal.     Breath sounds: Normal breath sounds.  Abdominal:     General: Bowel sounds are normal. There is no distension.     Palpations: Abdomen is soft.     Tenderness: There is no abdominal tenderness. There is no right CVA tenderness, left CVA tenderness or guarding.  Musculoskeletal:        General: Normal range of motion.     Cervical back: Normal range of motion and neck supple.  Skin:    General: Skin is warm and dry.  Neurological:     Mental Status: She is alert and oriented to person, place, and time.  Psychiatric:        Mood and Affect: Mood normal.        Thought Content: Thought content normal.        Judgment: Judgment normal.      UC Treatments / Results  Labs (all labs ordered are listed, but only abnormal results are displayed) Labs Reviewed  POCT URINALYSIS DIP (MANUAL ENTRY) - Abnormal; Notable for the following components:      Result Value   Color, UA light yellow (*)    Clarity, UA cloudy (*)    Blood, UA small (*)    Leukocytes, UA Large (3+) (*)    All other components within normal limits  URINE CULTURE    EKG   Radiology No results found.  Procedures Procedures (including critical care time)  Medications Ordered in UC Medications - No data to display  Initial Impression / Assessment and Plan / UC Course  I have reviewed the triage vital signs and the nursing notes.  Pertinent labs & imaging results that were available during my care of the patient were reviewed by me and considered in my medical decision making (see chart for details).     Vitals and exam reassuring today, urinalysis with evidence of urinary tract infection.  Urine culture pending, treat with Keflex and fluids while awaiting results and adjust if needed.  Return for worsening symptoms. Final Clinical Impressions(s) / UC Diagnoses   Final diagnoses:  Acute  lower UTI     Discharge Instructions      Take the full course of antibiotics, stay well-hydrated, empty your bladder fully with each urination.  We will let you know if your urine culture tells [place] that we need to make any changes to your medication regimen.    ED Prescriptions     Medication Sig Dispense Auth. Provider   cephALEXin (KEFLEX) 500 MG capsule Take 1 capsule (500 mg total) by mouth 2 (two) times daily. 10 capsule Particia Nearing, [place]      PDMP not reviewed this encounter.   Roosvelt Maser Metter, [place] 10/03/23 [phone]

## 2023-10-03 NOTE — Discharge Instructions (Addendum)
Take the full course of antibiotics, stay well-hydrated, empty your bladder fully with each urination.  We will let you know if your urine culture tells [place] that we need to make any changes to your medication regimen.

## 2023-10-05 LAB — URINE CULTURE: Culture: 40000 — AB

## 2023-10-20 ENCOUNTER — Other Ambulatory Visit: Payer: Self-pay | Admitting: Internal Medicine

## 2023-10-28 ENCOUNTER — Other Ambulatory Visit: Payer: Self-pay | Admitting: Internal Medicine

## 2023-10-28 DIAGNOSIS — I482 Chronic atrial fibrillation, unspecified: Secondary | ICD-10-CM

## 2023-10-28 NOTE — Telephone Encounter (Signed)
Eliquis 2.5mg  refill request received. Patient is 82 years old, weight-52.2kg, Crea-0.74 on 04/19/23, Diagnosis-Afib & Aflutter, and last seen by Dr. Ladona Ridgel on 11/24/22 and has an appt pending in December 2024. Dose is appropriate based on dosing criteria. Will send in refill to requested pharmacy.

## 2023-11-19 ENCOUNTER — Emergency Department (HOSPITAL_COMMUNITY): Payer: Medicare PPO

## 2023-11-19 ENCOUNTER — Emergency Department (HOSPITAL_COMMUNITY)
Admission: EM | Admit: 2023-11-19 | Discharge: 2023-11-19 | Disposition: A | Discharge status: Home / Self Care | Payer: Medicare PPO | Attending: Emergency Medicine | Admitting: Emergency Medicine

## 2023-11-19 ENCOUNTER — Other Ambulatory Visit: Payer: Self-pay

## 2023-11-19 ENCOUNTER — Encounter (HOSPITAL_COMMUNITY): Payer: Self-pay

## 2023-11-19 DIAGNOSIS — Z7901 Long term (current) use of anticoagulants: Secondary | ICD-10-CM | POA: Insufficient documentation

## 2023-11-19 DIAGNOSIS — R42 Dizziness and giddiness: Secondary | ICD-10-CM | POA: Insufficient documentation

## 2023-11-19 LAB — URINALYSIS, ROUTINE W REFLEX MICROSCOPIC
Bilirubin Urine: NEGATIVE
Glucose, UA: NEGATIVE mg/dL
Hgb urine dipstick: NEGATIVE
Ketones, ur: NEGATIVE mg/dL
Leukocytes,Ua: NEGATIVE
Nitrite: NEGATIVE
Protein, ur: NEGATIVE mg/dL
Specific Gravity, Urine: 1.004 — ABNORMAL LOW (ref 1.005–1.030)
pH: 8 (ref 5.0–8.0)

## 2023-11-19 LAB — CBC WITH DIFFERENTIAL/PLATELET
Abs Immature Granulocytes: 0.01 10*3/uL (ref 0.00–0.07)
Basophils Absolute: 0 10*3/uL (ref 0.0–0.1)
Basophils Relative: 1 %
Eosinophils Absolute: 0 10*3/uL (ref 0.0–0.5)
Eosinophils Relative: 1 %
HCT: 39.2 % (ref 36.0–46.0)
Hemoglobin: 13.1 g/dL (ref 12.0–15.0)
Immature Granulocytes: 0 %
Lymphocytes Relative: 35 %
Lymphs Abs: 1.4 10*3/uL (ref 0.7–4.0)
MCH: 31 pg (ref 26.0–34.0)
MCHC: 33.4 g/dL (ref 30.0–36.0)
MCV: 92.9 fL (ref 80.0–100.0)
Monocytes Absolute: 0.5 10*3/uL (ref 0.1–1.0)
Monocytes Relative: 13 %
Neutro Abs: 1.9 10*3/uL (ref 1.7–7.7)
Neutrophils Relative %: 50 %
Platelets: 203 10*3/uL (ref 150–400)
RBC: 4.22 MIL/uL (ref 3.87–5.11)
RDW: 13 % (ref 11.5–15.5)
WBC: 3.9 10*3/uL — ABNORMAL LOW (ref 4.0–10.5)
nRBC: 0 % (ref 0.0–0.2)

## 2023-11-19 LAB — BASIC METABOLIC PANEL
Anion gap: 5 (ref 5–15)
BUN: 11 mg/dL (ref 8–23)
CO2: 27 mmol/L (ref 22–32)
Calcium: 8.9 mg/dL (ref 8.9–10.3)
Chloride: 104 mmol/L (ref 98–111)
Creatinine, Ser: 0.74 mg/dL (ref 0.44–1.00)
GFR, Estimated: >60 mL/min (ref >60)
Glucose, Bld: 105 mg/dL — ABNORMAL HIGH (ref 70–99)
Potassium: 4 mmol/L (ref 3.5–5.1)
Sodium: 136 mmol/L (ref 135–145)

## 2023-11-19 NOTE — ED Notes (Signed)
Assisted pt. To bathroom. Tolerated well with one assist, pt. Did not complain of any dizziness.

## 2023-11-19 NOTE — ED Triage Notes (Signed)
Pt BIB from home by [facility] for vertigo.  Pt has hx of vertigo, reports it isn't any worse. Denies any pain or S/S of stroke. Pt's husband read that long term vertigo could cause a stroke so he called EMS concerned. Pt has not complaints at time of triage.

## 2023-11-19 NOTE — ED Provider Notes (Signed)
[place] EMERGENCY DEPARTMENT AT [facility] Provider Note   CSN: [id number] Arrival date & time: 11/19/23  0865     History  Chief Complaint  Patient presents with   Dizziness    Kelly Dougherty is a 82 y.o. female.  She woke up this morning and was in the bathroom when she began experiencing dizziness which she calls swimmy headed.  She said she gets this fairly frequently.  She is not on any medication for it.  Her husband called the ambulance who brought her here.  She denies any double vision blurry vision numbness weakness.  No chest pain or shortness of breath.  She is on Eliquis.  She has had vertigo for years.  She also said she has had 2 strokes in the past.  It is left her with some memory issues and some balance issues.  She uses a cane when she walks outside.  No recent falls.  The history is provided by the patient.  Dizziness Quality:  Lightheadedness and head spinning Duration:  2 hours Timing:  Intermittent Progression:  Unchanged Chronicity:  Recurrent Context: standing up   Relieved by:  None tried Worsened by:  Nothing Ineffective treatments:  None tried Associated symptoms: no chest pain, no headaches, no nausea, no palpitations, no shortness of breath, no syncope, no vision changes, no vomiting and no weakness        Home Medications Prior to Admission medications   Medication Sig Start Date End Date Taking? Authorizing Provider  acetaminophen (TYLENOL) 500 MG tablet Take 1 tablet (500 mg total) by mouth every 6 (six) hours as needed. 12/06/20   Lorelee New, PA-C  apixaban (ELIQUIS) 2.5 MG TABS tablet Take 1 tablet by mouth twice daily 10/28/23   Marinus Maw, MD  calcium-vitamin D (OSCAL WITH D) 500-200 MG-UNIT tablet Take 1 tablet by mouth 2 (two) times daily.     [provider]  cephALEXin (KEFLEX) 500 MG capsule Take 1 capsule (500 mg total) by mouth every 12 (twelve) hours. 04/19/23   Park Meo, FNP  cephALEXin  (KEFLEX) 500 MG capsule Take 1 capsule (500 mg total) by mouth 2 (two) times daily. 10/03/23   Particia Nearing, PA-C  digoxin (LANOXIN) 0.125 MG tablet TAKE 1 TABLET BY MOUTH ONCE DAILY MONDAY  THRU  SATURDAY.  NONE  ON  SUNDAY 10/20/23   Marinus Maw, MD  Docusate Calcium (STOOL SOFTENER PO) Take 100 mg by mouth daily.     [provider]  furosemide (LASIX) 20 MG tablet TAKE 1 TABLET BY MOUTH DAILY AS NEEDED FOR SWELLING Patient taking differently: Take 20 mg by mouth daily as needed for edema. 03/03/19   Laqueta Linden, MD  latanoprost (XALATAN) 0.005 % ophthalmic solution Place 1 drop into both eyes at bedtime.    [provider]  metoprolol succinate (TOPROL-XL) 50 MG 24 hr tablet Take 1 tablet (50 mg total) by mouth daily. Take with or immediately following a meal. 12/16/22   Marinus Maw, MD  Netarsudil Dimesylate 0.02 % SOLN Apply to eye. 07/31/20   [provider]  potassium chloride (K-DUR,KLOR-CON) 10 MEQ tablet TAKE 1 TABLET BY MOUTH ONLY ON DAYS YOUR TAKING LASIX FOR SWELLING Patient taking differently: Take 10 mEq by mouth daily as needed. TAKE 10 meqTABLET BY MOUTH ONLY ON DAYS YOUR TAKING LASIX FOR SWELLING 03/03/19   Laqueta Linden, MD  timolol (BETIMOL) 0.5 % ophthalmic solution Place 1 drop into  the left eye 2 (two) times daily.    [provider]  timolol (TIMOPTIC) 0.5 % ophthalmic solution Place 1 drop into the left eye 2 (two) times daily. 10/25/19   [provider]      Allergies    Patient has no known allergies.    Review of Systems   Review of Systems  Constitutional:  Negative for fever.  Eyes:  Negative for visual disturbance.  Respiratory:  Negative for shortness of breath.   Cardiovascular:  Negative for chest pain, palpitations and syncope.  Gastrointestinal:  Negative for nausea and vomiting.  Neurological:  Positive for dizziness. Negative for weakness and headaches.    Physical Exam Updated  Vital Signs BP (!) 146/98 (BP Location: Right Arm)   Pulse 84   Temp 98.3 F (36.8 C) (Oral)   Resp 18   Ht 5\' 5"  (1.651 m)   Wt 59.3 kg   SpO2 98%   BMI 21.76 kg/m  Physical Exam Vitals and nursing note reviewed.  Constitutional:      General: She is not in acute distress.    Appearance: Normal appearance. She is well-developed.  HENT:     Head: Normocephalic and atraumatic.  Eyes:     Conjunctiva/sclera: Conjunctivae normal.  Cardiovascular:     Rate and Rhythm: Normal rate. Rhythm irregular.     Heart sounds: No murmur heard. Pulmonary:     Effort: Pulmonary effort is normal. No respiratory distress.     Breath sounds: Normal breath sounds.  Abdominal:     Palpations: Abdomen is soft.     Tenderness: There is no abdominal tenderness. There is no guarding or rebound.  Musculoskeletal:        General: No swelling.     Cervical back: Neck supple.  Skin:    General: Skin is warm and dry.     Capillary Refill: Capillary refill takes less than 2 seconds.  Neurological:     General: No focal deficit present.     Mental Status: She is alert.     Cranial Nerves: No cranial nerve deficit.     Sensory: No sensory deficit.     Motor: No weakness.     ED Results / Procedures / Treatments   Labs (all labs ordered are listed, but only abnormal results are displayed) Labs Reviewed  BASIC METABOLIC PANEL - Abnormal; Notable for the following components:      Result Value   Glucose, Bld 105 (*)    All other components within normal limits  CBC WITH DIFFERENTIAL/PLATELET - Abnormal; Notable for the following components:   WBC 3.9 (*)    All other components within normal limits  URINALYSIS, ROUTINE W REFLEX MICROSCOPIC - Abnormal; Notable for the following components:   Color, Urine STRAW (*)    Specific Gravity, Urine 1.004 (*)    All other components within normal limits    EKG EKG Interpretation Date/Time:  Friday November 19 2023 07:29:59 EST Ventricular Rate:   81 PR Interval:    QRS Duration:  65 QT Interval:  432 QTC Calculation: 446 R Axis:   81  Text Interpretation: Atrial fibrillation Probable anteroseptal infarct, recent rate slower than prior 1/22 Confirmed by Meridee Score [phone]) on 11/19/2023 7:48:37 AM  Radiology MR BRAIN WO CONTRAST  Result Date: 11/19/2023 CLINICAL DATA:  Neuro deficit, acute, stroke suspected EXAM: MRI HEAD WITHOUT CONTRAST TECHNIQUE: Multiplanar, multiecho pulse sequences of the brain and surrounding structures were obtained without intravenous contrast. COMPARISON:  CT  Head 12/06/20 FINDINGS: Brain: Negative for an acute infarct. No hemorrhage. No hydrocephalus. No extra-axial fluid collection. No mass effect. No mass lesion. Chronic infarcts in the bilateral cerebellum. Punctate microhemorrhage in the left frontal lobe. Background of moderate chronic microvascular ischemic change. Generalized volume loss without lobar predominance. Vascular: Normal flow voids. Skull and upper cervical spine: Normal marrow signal. Sinuses/Orbits: Trace left mastoid effusion. No middle ear effusion. Paranasal sinuses are notable for mild mucosal thickening in the bilateral ethmoid air cells. Bilateral lens replacement. Orbits are otherwise unremarkable. Other: None. IMPRESSION: 1.  No acute intracranial process. 2.  Chronic bilateral cerebellar infarcts. Electronically Signed   By: Lorenza Cambridge M.D.   On: 11/19/2023 11:01    Procedures Procedures    Medications Ordered in ED Medications - No data to display  ED Course/ Medical Decision Making/ A&P Clinical Course as of 11/19/23 1712  Fri Nov 19, 2023  1104 MRI does not show any acute event.  Does show chronic cerebellar infarcts. [MB]    Clinical Course User Index [MB] Terrilee Files, MD                                 Medical Decision Making Amount and/or Complexity of Data Reviewed Labs: ordered. Radiology: ordered.   This patient complains of dizzy swimmy  headed; this involves an extensive number of treatment Options and is a complaint that carries with it a high risk of complications and morbidity. The differential includes vertigo, dehydration, infection, stroke, bleed  I ordered, reviewed and interpreted labs, which included CBC with mildly low white count stable hemoglobin, chemistries normal, urinalysis unremarkable I ordered imaging studies which included MRI brain and I independently    visualized and interpreted imaging which showed no acute findings Additional history obtained from patient's husband Previous records obtained and reviewed in epic including recent PCP notes Cardiac monitoring reviewed, A-fib with controlled rate Social determinants considered, no significant barriers Critical Interventions: None  After the interventions stated above, I reevaluated the patient and found patient to symptomatically be feeling better Admission and further testing considered, no indications for admission or further workup at this time.  Recommended close follow-up with her PCP.  Return instructions discussed         Final Clinical Impression(s) / ED Diagnoses Final diagnoses:  Dizziness    Rx / DC Orders ED Discharge Orders     None         Terrilee Files, MD 11/19/23 1714

## 2023-11-19 NOTE — ED Notes (Signed)
Pt. To MRI

## 2023-11-19 NOTE — Discharge Instructions (Signed)
You were seen in the emergency department for worsening dizziness.  You had blood work urinalysis and an MRI of your brain that did not show any significant abnormalities.  Please continue your regular medications and stay well-hydrated.  Follow-up with your primary care doctor.  Return to the emergency department if any worsening or concerning symptoms

## 2023-11-19 NOTE — ED Notes (Signed)
Pt. Back from MRI

## 2023-12-05 ENCOUNTER — Other Ambulatory Visit: Payer: Self-pay | Admitting: Internal Medicine

## 2023-12-15 ENCOUNTER — Ambulatory Visit: Payer: Medicare PPO | Admitting: Internal Medicine

## 2023-12-21 ENCOUNTER — Ambulatory Visit: Payer: Medicare PPO | Attending: Internal Medicine | Admitting: Internal Medicine

## 2023-12-21 ENCOUNTER — Encounter: Payer: Self-pay | Admitting: Internal Medicine

## 2023-12-21 VITALS — BP 138/74 | HR 77 | Ht 66.0 in | Wt 116.8 lb

## 2023-12-21 DIAGNOSIS — I4819 Other persistent atrial fibrillation: Secondary | ICD-10-CM

## 2023-12-21 MED ORDER — METOPROLOL SUCCINATE ER 50 MG PO TB24: 50.0000 mg | ORAL_TABLET | Freq: Every day | ORAL | 3 refills | Status: AC

## 2023-12-21 NOTE — Patient Instructions (Signed)
Medication Instructions:  Your physician recommends that you continue on your current medications as directed. Please refer to the Current Medication list given to you today.  *If you need a refill on your cardiac medications before your next appointment, please call your pharmacy*   Lab Work: None If you have labs (blood work) drawn today and your tests are completely normal, you will receive your results only by: MyChart Message (if you have MyChart) OR A paper copy in the mail If you have any lab test that is abnormal or we need to change your treatment, we will call you to review the results.   Testing/Procedures: None   Follow-Up: At [facility], you and your health needs are our priority.  As part of our continuing mission to provide you with exceptional heart care, we have created designated Provider Care Teams.  These Care Teams include your primary Cardiologist (physician) and Advanced Practice Providers (APPs -  Physician Assistants and Nurse Practitioners) who all work together to provide you with the care you need, when you need it.  We recommend signing up for the patient portal called "MyChart".  Sign up information is provided on this After Visit Summary.  MyChart is used to connect with patients for Virtual Visits (Telemedicine).  Patients are able to view lab/test results, encounter notes, upcoming appointments, etc.  Non-urgent messages can be sent to your provider as well.   To learn more about what you can do with MyChart, go to [email or web address].    Your next appointment:   1 year(s)  Provider:   Ronni Rumble , MD or Lewayne Bunting, MD   Other Instructions

## 2023-12-21 NOTE — Progress Notes (Signed)
HPI [name] returns today for followup. She is a pleasant 82 yo woman with a h/o atrial flutter and atrial fib. She has had a flutter ablation and then was continued on flecainide. She has had recurrent atrial fib/flutter. She feels palpitations and at night when she lies down feels like her heart is beating fast. When I saw her last we started digoxin and continued her toprol. No edema and no chest pain. No syncope. She does not have palpitations.  No Known Allergies   Current Outpatient Medications  Medication Sig Dispense Refill   acetaminophen (TYLENOL) 500 MG tablet Take 1 tablet (500 mg total) by mouth every 6 (six) hours as needed. 30 tablet 0   apixaban (ELIQUIS) 2.5 MG TABS tablet Take 1 tablet by mouth twice daily 180 tablet 1   calcium-vitamin D (OSCAL WITH D) 500-200 MG-UNIT tablet Take 1 tablet by mouth 2 (two) times daily.      cephALEXin (KEFLEX) 500 MG capsule Take 1 capsule (500 mg total) by mouth every 12 (twelve) hours. 14 capsule 0   cephALEXin (KEFLEX) 500 MG capsule Take 1 capsule (500 mg total) by mouth 2 (two) times daily. 10 capsule 0   digoxin (LANOXIN) 0.125 MG tablet TAKE 1 TABLET BY MOUTH ONCE DAILY MONDAY  THRU  SATURDAY.  NONE  ON  SUNDAY 90 tablet 0   Docusate Calcium (STOOL SOFTENER PO) Take 100 mg by mouth daily.      furosemide (LASIX) 20 MG tablet TAKE 1 TABLET BY MOUTH DAILY AS NEEDED FOR SWELLING (Patient taking differently: Take 20 mg by mouth daily as needed for edema.) 90 tablet 3   latanoprost (XALATAN) 0.005 % ophthalmic solution Place 1 drop into both eyes at bedtime.     metoprolol succinate (TOPROL-XL) 50 MG 24 hr tablet Take 1 tablet (50 mg total) by mouth daily. Take with or immediately following a meal. Please keep scheduled appointment for future refills. Thank you. 30 tablet 0   Netarsudil Dimesylate 0.02 % SOLN Apply to eye.     potassium chloride (K-DUR,KLOR-CON) 10 MEQ tablet TAKE 1 TABLET BY MOUTH ONLY ON DAYS YOUR TAKING LASIX  FOR SWELLING (Patient taking differently: Take 10 mEq by mouth daily as needed. TAKE 10 meqTABLET BY MOUTH ONLY ON DAYS YOUR TAKING LASIX FOR SWELLING) 90 tablet 3   timolol (BETIMOL) 0.5 % ophthalmic solution Place 1 drop into the left eye 2 (two) times daily.     timolol (TIMOPTIC) 0.5 % ophthalmic solution Place 1 drop into the left eye 2 (two) times daily.     No current facility-administered medications for this visit.     Past Medical History:  Diagnosis Date   Arthritis    Atrial fibrillation (HCC)    Cataract    Dysrhythmia    04/2015 with atrial fibrillation   Glaucoma    History of echocardiogram    Echo 1/19: EF 60-65, no RWMA, normal diastolic function, mild to mod AI, MAC, mild to mod MR, mild BAE, mod TR, PASP 40    ROS:   All systems reviewed and negative except as noted in the HPI.   Past Surgical History:  Procedure Laterality Date   A-FLUTTER ABLATION N/A 10/02/2019   Procedure: A-FLUTTER ABLATION;  Surgeon: Taija Mathias W, MD;  [facility];  Service: Cardiovascular;  Laterality: N/A;   APPENDECTOMY     82  years of age   CARDIOVERSION N/A 10/03/2015   Procedure: CARDIOVERSION;  Surgeon:  Laqueta Linden, MD;  Location: [facility];  Service: Cardiovascular;  Laterality: N/A;   COLONOSCOPY N/A 07/30/2016   Procedure: COLONOSCOPY;  Surgeon: Malissa Hippo, MD;  Location: [facility];  Service: Endoscopy;  Laterality: N/A;  8:30   EYE SURGERY     left   HEMORROIDECTOMY     TONSILLECTOMY     childhood     Family History  Problem Relation Age of Onset   Hearing loss Mother    Alzheimer's disease Father      Social History   Socioeconomic History   Marital status: Married    Spouse name: Not on file   Number of children: Not on file   Years of education: Not on file   Highest education level: Not on file  Occupational History   Not on file  Tobacco Use   Smoking status: Never   Smokeless tobacco: Never  Vaping Use   Vaping  status: Never Used  Substance and Sexual Activity   Alcohol use: Yes    Alcohol/week: 7.0 standard drinks of alcohol    Types: 7 Glasses of wine per week    Comment: one glass of wine with dinner each night   Drug use: No   Sexual activity: Not Currently  Other Topics Concern   Not on file  Social History Narrative   Not on file   Social Drivers of Health   Financial Resource Strain: Low Risk  (05/06/2023)   Overall Financial Resource Strain (CARDIA)    Difficulty of Paying Living Expenses: Not hard at all  Food Insecurity: No Food Insecurity (05/06/2023)   Hunger Vital Sign    Worried About Running Out of Food in the Last Year: Never true    Ran Out of Food in the Last Year: Never true  Transportation Needs: No Transportation Needs (05/06/2023)   PRAPARE - Administrator, Civil Service (Medical): No    Lack of Transportation (Non-Medical): No  Physical Activity: Insufficiently Active (05/06/2023)   Exercise Vital Sign    Days of Exercise per Week: 4 days    Minutes of Exercise per Session: 10 min  Stress: No Stress Concern Present (05/06/2023)   [employer] of Occupational Health - Occupational Stress Questionnaire    Feeling of Stress : Not at all  Social Connections: Moderately Isolated (05/06/2023)   Social Connection and Isolation Panel [NHANES]    Frequency of Communication with Friends and Family: More than three times a week    Frequency of Social Gatherings with Friends and Family: More than three times a week    Attends Religious Services: Never    Database administrator or Organizations: No    Attends Banker Meetings: Never    Marital Status: Married  Catering manager Violence: Not At Risk (05/06/2023)   Humiliation, Afraid, Rape, and Kick questionnaire    Fear of Current or Ex-Partner: No    Emotionally Abused: No    Physically Abused: No    Sexually Abused: No     BP 138/74   Pulse 77   Ht 5\' 6"  (1.676 m)   Wt 116 lb 12.8 oz (53 kg)    SpO2 98%   BMI 18.85 kg/m   Physical Exam:  Well appearing NAD HEENT: Unremarkable Neck:  No JVD, no thyromegally Lymphatics:  No adenopathy Back:  No CVA tenderness Lungs:  Clear with no wheezes HEART:  IRegular rate rhythm, no murmurs, no rubs, no clicks Abd:  soft,  positive bowel sounds, no organomegally, no rebound, no guarding Ext:  2 plus pulses, no edema, no cyanosis, no clubbing Skin:  No rashes no nodules Neuro:  CN II through XII intact, motor grossly intact   Assess/Plan: 1. Uncontrolled atrial fib/flutter - she appears to now be well controlled.  2. coags - she has had no bleeding on eliquis. 3. HTN her bp is well controlled. We will continue her beta blocker 4. Weight loss - her weight has stabilized.   Sharlot Gowda Brailey Buescher,MD

## 2024-01-22 ENCOUNTER — Other Ambulatory Visit: Payer: Self-pay | Admitting: Internal Medicine

## 2024-01-24 ENCOUNTER — Telehealth: Payer: Self-pay | Admitting: Internal Medicine

## 2024-01-24 MED ORDER — DIGOXIN 125 MCG PO TABS: ORAL_TABLET | ORAL | 3 refills | Status: AC

## 2024-01-24 NOTE — Telephone Encounter (Signed)
Refilled as requested

## 2024-01-24 NOTE — Telephone Encounter (Signed)
*  STAT* If patient is at the pharmacy, call can be transferred to refill team.   1. Which medications need to be refilled? (please list name of each medication and dose if known)  digoxin (LANOXIN) 0.125 MG tablet   2. Which pharmacy/location (including street and city if local pharmacy) is medication to be sent to?  [facility] [address], [place] - [address]    3. Do they need a 30 day or 90 day supply? 90

## 2024-02-11 ENCOUNTER — Ambulatory Visit: Payer: Medicare PPO | Admitting: Orthopedic Surgery

## 2024-04-22 ENCOUNTER — Other Ambulatory Visit: Payer: Self-pay | Admitting: Internal Medicine

## 2024-04-22 DIAGNOSIS — I482 Chronic atrial fibrillation, unspecified: Secondary | ICD-10-CM

## 2024-04-24 NOTE — Telephone Encounter (Signed)
 Prescription refill request for Eliquis  received. Indication: AF Last office visit: 12/21/23  Alesia Husky MD Scr: 0.74 11/19/23  Epic Age: 83 Weight: 53kg  Based on above findings Eliquis  2.5mg  twice daily is the appropriate dose.  Refill approved.

## 2024-05-04 ENCOUNTER — Ambulatory Visit: Payer: Self-pay

## 2024-05-04 NOTE — Telephone Encounter (Signed)
 Copied from CRM [id number]. Topic: Clinical - Red Word Triage >> May 04, 2024  8:05 AM Kelly Dougherty wrote: Red Word that prompted transfer to Nurse Triage: Pt's daughter is calling reporting depression symptoms and also states that the patient has mentioned self harm this week.   Pt's daughter is not currently with the patient, spoke with NT. Agreed that NT will contact the patient directly.  Chief Complaint: depression, dementia Symptoms: feeling depressed Frequency: constant Pertinent Negatives: Patient denies SI at this time, cp, sob, fever Disposition: [] ED /[] Urgent Care (no appt availability in office) / [x] Appointment(In office/virtual)/ []  [facility]/ [] Home Care/ [] Refused Recommended Disposition /[] [place] [facility]/ []  Follow-up with PCP Additional Notes: with daughter today; past thoughts of SI denies current thoughts; apt made per protocol; care advice given, denies questions; instructed to go to ER if becomes worse.   Reason for Disposition  [1] Depression AND [2] worsening (e.g., sleeping poorly, less able to do activities of daily living)  Answer Assessment - Initial Assessment Questions 1. CONCERN: "What happened that made you call today?"     Symptoms of depression 2. DEPRESSION SYMPTOM SCREENING: "How are you feeling overall?" (e.g., decreased energy, increased sleeping or difficulty sleeping, difficulty concentrating, feelings of sadness, guilt, hopelessness, or worthlessness)     Needs help dealing with issues and to know what to do about it.  3. RISK OF HARM - SUICIDAL IDEATION:  "Do you ever have thoughts of hurting or killing yourself?"  (e.g., yes, no, no but preoccupation with thoughts about death)   - INTENT:  "Do you have thoughts of hurting or killing yourself right NOW?" (e.g., yes, no, N/A)   - PLAN: "Do you have a specific plan for how you would do this?" (e.g., gun, knife, overdose, no plan, N/A)     Talked about self harm this week; had a plan;  daughter states feeling better now 4. RISK OF HARM - HOMICIDAL IDEATION:  "Do you ever have thoughts of hurting or killing someone else?"  (e.g., yes, no, no but preoccupation with thoughts about death)   - INTENT:  "Do you have thoughts of hurting or killing someone right NOW?" (e.g., yes, no, N/A)   - PLAN: "Do you have a specific plan for how you would do this?" (e.g., gun, knife, no plan, N/A)      no 5. FUNCTIONAL IMPAIRMENT: "How have things been going for you overall? Have you had more difficulty than usual doing your normal daily activities?"  (e.g., better, same, worse; self-care, school, work, interactions)     overwhelmed 6. SUPPORT: "Who is with you now?" "Who do you live with?" "Do you have family or friends who you can talk to?"      daughter 7. THERAPIST: "Do you have a counselor or therapist? Name?"     no 8. STRESSORS: "Has there been any new stress or recent changes in your life?"     Dementia and depression 9. ALCOHOL USE OR SUBSTANCE USE (DRUG USE): "Do you drink alcohol or use any illegal drugs?"     no 10. OTHER: "Do you have any other physical symptoms right now?" (e.g., fever)       no 11. PREGNANCY: "Is there any chance you are pregnant?" "When was your last menstrual period?"       na  Protocols used: Depression-A-AH

## 2024-05-04 NOTE — Telephone Encounter (Signed)
 Copied from CRM [id number]. Topic: Clinical - Red Word Triage >> May 04, 2024  8:05 AM Baldemar Lev wrote: Red Word that prompted transfer to Nurse Triage: Pt's daughter is calling reporting depression symptoms and also states that the patient has mentioned self harm this week.   Pt's daughter is not currently with the patient, spoke with NT. Agreed that NT will contact the patient directly.   Additional Notes: This triage RN attempted to contact the patient and spouse, no answer on either attempt at this time. Unable to leave a voicemail. "Call could not be completed as dialed."

## 2024-05-04 NOTE — Telephone Encounter (Signed)
 Multiple attempts were made to contact pt. No contact made. No VM could be left as recording states "Call cannot be completed as dialed". Routing to clinic.

## 2024-05-09 ENCOUNTER — Ambulatory Visit: Admitting: Family Medicine

## 2024-05-09 ENCOUNTER — Encounter: Payer: Self-pay | Admitting: Family Medicine

## 2024-05-09 VITALS — BP 120/80 | HR 66 | Temp 98.1°F | Ht 66.0 in | Wt 118.8 lb

## 2024-05-09 DIAGNOSIS — R413 Other amnesia: Secondary | ICD-10-CM

## 2024-05-09 DIAGNOSIS — F322 Major depressive disorder, single episode, severe without psychotic features: Secondary | ICD-10-CM | POA: Diagnosis not present

## 2024-05-09 MED ORDER — ESCITALOPRAM OXALATE 10 MG PO TABS: 10.0000 mg | ORAL_TABLET | Freq: Every day | ORAL | 5 refills | Status: AC

## 2024-05-09 NOTE — Progress Notes (Signed)
 Subjective:    Patient ID: Kelly Dougherty, female    DOB: 02-06-1941, 83 y.o.   MRN: [id number]  HPI  Patient is a very sweet 83 year old Caucasian female who is here today with her daughter to establish care with me.  Family reports 2 to 3-year history of memory loss.  Patient has a strong family history of Alzheimer's disease and dementia.  Over the last 2 to 3 years, they have noticed that her mother's memory gradually worsening.  She reports that she has a difficult time doing tasks that she has always done.  She has problems with her balance.  She has problems with her short-term memory.  She also appears to have trouble finding words.  She speaks slowly and has a delayed response to questions.  Of note, the patient had an MRI last year.  I reviewed the results of that.  She also had a B12 level that was checked and was 159.  However not take any vitamin B12 supplement.  Over the last several months, she has been dealing with depression.  Her daughter thinks some of the depression could be stemming from dealing with her dementia and memory loss.  Patient reports anhedonia and feeling that she would be better off if she were not here.  She has even had suicidal thoughts.  She states that last week, she thought about cutting her wrist with a knife that she was not able to go through with it.  She told her husband and her children about her thoughts.  She states that after talking with them this last week she is feeling better.  She denies suicidal thoughts at the present time.  She denies any desire to act on them.  However she does report sadness. Past Medical History:  Diagnosis Date   Arthritis    Atrial fibrillation [facility])    Cataract    Dysrhythmia    04/2015 with atrial fibrillation   Glaucoma    History of echocardiogram    Echo 1/19: EF 60-65, no RWMA, normal diastolic function, mild to mod AI, MAC, mild to mod MR, mild BAE, mod TR, PASP 40   Past Surgical History:  Procedure Laterality  Date   A-FLUTTER ABLATION N/A 10/02/2019   Procedure: A-FLUTTER ABLATION;  Surgeon: Tammie Fall, MD;  Location: [facility];  Service: Cardiovascular;  Laterality: N/A;   APPENDECTOMY     83 years of age   CARDIOVERSION N/A 10/03/2015   Procedure: CARDIOVERSION;  Surgeon: Flavia Hughs, MD;  Location: [facility];  Service: Cardiovascular;  Laterality: N/A;   COLONOSCOPY N/A 07/30/2016   Procedure: COLONOSCOPY;  Surgeon: Ruby Corporal, MD;  Location: [facility];  Service: Endoscopy;  Laterality: N/A;  8:30   EYE SURGERY     left   HEMORROIDECTOMY     TONSILLECTOMY     childhood   Current Outpatient Medications on File Prior to Visit  Medication Sig Dispense Refill   acetaminophen  (TYLENOL ) 500 MG tablet Take 1 tablet (500 mg total) by mouth every 6 (six) hours as needed. 30 tablet 0   apixaban  (ELIQUIS ) 2.5 MG TABS tablet Take 1 tablet by mouth twice daily 180 tablet 1   calcium-vitamin D (OSCAL WITH D) 500-200 MG-UNIT tablet Take 1 tablet by mouth 2 (two) times daily.      cephALEXin  (KEFLEX ) 500 MG capsule Take 1 capsule (500 mg total) by mouth every 12 (twelve) hours. 14 capsule 0   cephALEXin  (KEFLEX ) 500  MG capsule Take 1 capsule (500 mg total) by mouth 2 (two) times daily. 10 capsule 0   digoxin  (LANOXIN ) 0.125 MG tablet TAKE 1 TABLET BY MOUTH ONCE DAILY MONDAY  THRU  SATURDAY.  NONE  ON  SUNDAY 90 tablet 3   Docusate Calcium (STOOL SOFTENER PO) Take 100 mg by mouth daily.      furosemide  (LASIX ) 20 MG tablet TAKE 1 TABLET BY MOUTH DAILY AS NEEDED FOR SWELLING (Patient taking differently: Take 20 mg by mouth daily as needed for edema.) 90 tablet 3   latanoprost (XALATAN) 0.005 % ophthalmic solution Place 1 drop into both eyes at bedtime.     metoprolol  succinate (TOPROL -XL) 50 MG 24 hr tablet Take 1 tablet (50 mg total) by mouth daily. Take with or immediately following a meal. Please keep scheduled appointment for future refills. Thank you. 90 tablet 3   Netarsudil  Dimesylate 0.02 % SOLN Apply to eye.     potassium chloride  (K-DUR,KLOR-CON ) 10 MEQ tablet TAKE 1 TABLET BY MOUTH ONLY ON DAYS YOUR TAKING LASIX  FOR SWELLING (Patient taking differently: Take 10 mEq by mouth daily as needed. TAKE 10 meqTABLET BY MOUTH ONLY ON DAYS YOUR TAKING LASIX  FOR SWELLING) 90 tablet 3   timolol (BETIMOL) 0.5 % ophthalmic solution Place 1 drop into the left eye 2 (two) times daily.     timolol (TIMOPTIC) 0.5 % ophthalmic solution Place 1 drop into the left eye 2 (two) times daily.     No current facility-administered medications on file prior to visit.   Social History   Socioeconomic History   Marital status: Married    Spouse name: Not on file   Number of children: Not on file   Years of education: Not on file   Highest education level: Not on file  Occupational History   Not on file  Tobacco Use   Smoking status: Never   Smokeless tobacco: Never  Vaping Use   Vaping status: Never Used  Substance and Sexual Activity   Alcohol use: Yes    Alcohol/week: 7.0 standard drinks of alcohol    Types: 7 Glasses of wine per week    Comment: one glass of wine with dinner each night   Drug use: No   Sexual activity: Not Currently  Other Topics Concern   Not on file  Social History Narrative   Not on file   Social Drivers of Health   Financial Resource Strain: Low Risk  (05/06/2023)   Overall Financial Resource Strain (CARDIA)    Difficulty of Paying Living Expenses: Not hard at all  Food Insecurity: No Food Insecurity (05/06/2023)   Hunger Vital Sign    Worried About Running Out of Food in the Last Year: Never true    Ran Out of Food in the Last Year: Never true  Transportation Needs: No Transportation Needs (05/06/2023)   PRAPARE - Administrator, Civil Service (Medical): No    Lack of Transportation (Non-Medical): No  Physical Activity: Insufficiently Active (05/06/2023)   Exercise Vital Sign    Days of Exercise per Week: 4 days    Minutes of Exercise  per Session: 10 min  Stress: No Stress Concern Present (05/06/2023)   [employer] of Occupational Health - Occupational Stress Questionnaire    Feeling of Stress : Not at all  Social Connections: Moderately Isolated (05/06/2023)   Social Connection and Isolation Panel [NHANES]    Frequency of Communication with Friends and Family: More than three times a  week    Frequency of Social Gatherings with Friends and Family: More than three times a week    Attends Religious Services: Never    Database administrator or Organizations: No    Attends Banker Meetings: Never    Marital Status: Married  Catering manager Violence: Not At Risk (05/06/2023)   Humiliation, Afraid, Rape, and Kick questionnaire    Fear of Current or Ex-Partner: No    Emotionally Abused: No    Physically Abused: No    Sexually Abused: No   Family History  Problem Relation Age of Onset   Hearing loss Mother    Alzheimer's disease Father      Review of Systems  All other systems reviewed and are negative.      Objective:   Physical Exam Vitals reviewed.  Constitutional:      Appearance: Normal appearance. She is normal weight.  HENT:     Head: Normocephalic and atraumatic.  Cardiovascular:     Rate and Rhythm: Normal rate. Rhythm irregular.     Heart sounds: Normal heart sounds. No murmur heard.    No friction rub. No gallop.  Pulmonary:     Effort: Pulmonary effort is normal. No respiratory distress.     Breath sounds: Normal breath sounds. No wheezing, rhonchi or rales.  Neurological:     General: No focal deficit present.     Mental Status: She is alert and oriented to person, place, and time. Mental status is at baseline.     Cranial Nerves: No cranial nerve deficit.     Sensory: No sensory deficit.     Motor: No weakness.     Coordination: Coordination normal.     Gait: Gait normal.  Psychiatric:        Mood and Affect: Mood normal. Affect is flat.        Speech: Speech is delayed.         Behavior: Behavior normal.        Thought Content: Thought content normal.        Cognition and Memory: Memory is impaired.        Judgment: Judgment normal.           Assessment & Plan:  Memory impairment of gradual onset - Plan: Vitamin B12, TSH, CBC with Differential/Platelet, Comprehensive metabolic panel with GFR  Depression, major, single episode, severe (HCC) We discussed possibly going to the hospital due to her recent suicidal thoughts.  Patient request not to go to the hospital.  I am concerned that her confusion and delirium would likely worsen if she were hospitalized.  Patient does not report any suicidal thoughts today.  We have decided to start Lexapro 10 mg a day and recheck in 4 weeks.  I am concerned that the patient may have severe vitamin B12 deficiency potentially contributing to her issues with balance and fatigue and memory loss.  I am going to repeat her lab work today and if B12 remains low I plan to start the patient on parenteral B12 1000 mcg subcu weekly x 1 month and then monthly thereafter

## 2024-05-10 LAB — CBC WITH DIFFERENTIAL/PLATELET
Absolute Lymphocytes: 2051 {cells}/uL (ref 850–3900)
Absolute Monocytes: 588 {cells}/uL (ref 200–950)
Basophils Absolute: 42 {cells}/uL (ref 0–200)
Basophils Relative: 0.8 %
Eosinophils Absolute: 42 {cells}/uL (ref 15–500)
Eosinophils Relative: 0.8 %
HCT: 39.2 % (ref 35.0–45.0)
Hemoglobin: 13.1 g/dL (ref 11.7–15.5)
MCH: 30.9 pg (ref 27.0–33.0)
MCHC: 33.4 g/dL (ref 32.0–36.0)
MCV: 92.5 fL (ref 80.0–100.0)
MPV: 9.6 fL (ref 7.5–12.5)
Monocytes Relative: 11.1 %
Neutro Abs: 2576 {cells}/uL (ref 1500–7800)
Neutrophils Relative %: 48.6 %
Platelets: 237 10*3/uL (ref 140–400)
RBC: 4.24 10*6/uL (ref 3.80–5.10)
RDW: 12.5 % (ref 11.0–15.0)
Total Lymphocyte: 38.7 %
WBC: 5.3 10*3/uL (ref 3.8–10.8)

## 2024-05-10 LAB — COMPREHENSIVE METABOLIC PANEL WITH GFR
AG Ratio: 1.3 (calc) (ref 1.0–2.5)
ALT: 15 U/L (ref 6–29)
AST: 25 U/L (ref 10–35)
Albumin: 4 g/dL (ref 3.6–5.1)
Alkaline phosphatase (APISO): 77 U/L (ref 37–153)
BUN: 9 mg/dL (ref 7–25)
CO2: 28 mmol/L (ref 20–32)
Calcium: 9.5 mg/dL (ref 8.6–10.4)
Chloride: 101 mmol/L (ref 98–110)
Creat: 0.71 mg/dL (ref 0.60–0.95)
Globulin: 3 g/dL (ref 1.9–3.7)
Glucose, Bld: 99 mg/dL (ref 65–99)
Potassium: 4.2 mmol/L (ref 3.5–5.3)
Sodium: 139 mmol/L (ref 135–146)
Total Bilirubin: 0.6 mg/dL (ref 0.2–1.2)
Total Protein: 7 g/dL (ref 6.1–8.1)
eGFR: 83 mL/min/{1.73_m2} (ref >=60)

## 2024-05-10 LAB — VITAMIN B12: Vitamin B-12: 197 pg/mL — ABNORMAL LOW (ref 200–1100)

## 2024-05-10 LAB — TSH: TSH: 2.04 m[IU]/L (ref 0.40–4.50)

## 2024-05-11 ENCOUNTER — Ambulatory Visit: Payer: Self-pay | Admitting: Family Medicine

## 2024-05-12 ENCOUNTER — Ambulatory Visit (INDEPENDENT_AMBULATORY_CARE_PROVIDER_SITE_OTHER)

## 2024-05-12 DIAGNOSIS — E538 Deficiency of other specified B group vitamins: Secondary | ICD-10-CM | POA: Diagnosis not present

## 2024-05-12 MED ORDER — CYANOCOBALAMIN 1000 MCG/ML IJ SOLN
1000.0000 ug | Freq: Once | INTRAMUSCULAR | Status: AC
  Administered 2024-05-12: 1000 ug via INTRAMUSCULAR

## 2024-05-15 ENCOUNTER — Ambulatory Visit: Admitting: Family Medicine

## 2024-05-19 ENCOUNTER — Ambulatory Visit (INDEPENDENT_AMBULATORY_CARE_PROVIDER_SITE_OTHER)

## 2024-05-19 DIAGNOSIS — E538 Deficiency of other specified B group vitamins: Secondary | ICD-10-CM

## 2024-05-19 MED ORDER — CYANOCOBALAMIN 1000 MCG/ML IJ SOLN
1000.0000 ug | Freq: Once | INTRAMUSCULAR | Status: AC
  Administered 2024-05-19: 1000 ug via INTRAMUSCULAR

## 2024-05-19 NOTE — Progress Notes (Signed)
 Patient is in office today for a nurse visit for B12 Injection. Patient Injection was given in the  Right deltoid. Patient tolerated injection well.  Pt was told to return in 1 week for next dose.  Vallerie Gave, CMA

## 2024-05-26 ENCOUNTER — Ambulatory Visit

## 2024-05-29 ENCOUNTER — Ambulatory Visit (INDEPENDENT_AMBULATORY_CARE_PROVIDER_SITE_OTHER): Admitting: Family Medicine

## 2024-05-29 VITALS — BP 117/80 | HR 97 | Temp 97.4°F | Ht 66.0 in | Wt 118.6 lb

## 2024-05-29 DIAGNOSIS — E538 Deficiency of other specified B group vitamins: Secondary | ICD-10-CM

## 2024-05-29 MED ORDER — CYANOCOBALAMIN 1000 MCG/ML IJ SOLN
1000.0000 ug | Freq: Once | INTRAMUSCULAR | Status: AC
  Administered 2024-05-29: 1000 ug via INTRAMUSCULAR

## 2024-05-29 NOTE — Progress Notes (Signed)
 Patient is in office today for a nurse visit for B12 Injection. Patient Injection was given in the  Left deltoid. Patient tolerated injection well.

## 2024-05-29 NOTE — Addendum Note (Signed)
 Addended by: Meriam Stamp on: 05/29/2024 03:41 PM   Modules accepted: Orders

## 2024-05-30 NOTE — Progress Notes (Signed)
 I have collaborated with the care management provider regarding care management and care coordination activities outlined in this encounter and have reviewed this encounter including documentation in the note and care plan. I am certifying that I agree with the content of this note and encounter as supervising physician.

## 2024-06-06 ENCOUNTER — Telehealth: Payer: Self-pay

## 2024-06-06 ENCOUNTER — Other Ambulatory Visit: Payer: Self-pay | Admitting: Family Medicine

## 2024-06-06 ENCOUNTER — Encounter: Payer: Self-pay | Admitting: Family Medicine

## 2024-06-06 ENCOUNTER — Ambulatory Visit: Admitting: Family Medicine

## 2024-06-06 VITALS — BP 122/77 | HR 100 | Ht 66.0 in | Wt 118.6 lb

## 2024-06-06 DIAGNOSIS — E538 Deficiency of other specified B group vitamins: Secondary | ICD-10-CM | POA: Diagnosis not present

## 2024-06-06 DIAGNOSIS — F322 Major depressive disorder, single episode, severe without psychotic features: Secondary | ICD-10-CM

## 2024-06-06 DIAGNOSIS — R413 Other amnesia: Secondary | ICD-10-CM | POA: Diagnosis not present

## 2024-06-06 MED ORDER — CYANOCOBALAMIN 1000 MCG/ML IJ SOLN: 1000.0000 ug | INTRAMUSCULAR | 0 refills | Status: DC

## 2024-06-06 MED ORDER — CYANOCOBALAMIN 1000 MCG/ML IJ SOLN
1000.0000 ug | Freq: Once | INTRAMUSCULAR | Status: AC
  Administered 2024-06-06: 1000 ug via INTRAMUSCULAR

## 2024-06-06 NOTE — Progress Notes (Signed)
 Subjective:    Patient ID: Kelly Dougherty, female    DOB: Nov 12, 1941, 83 y.o.   MRN: [id number]  HPI 05/09/24 Patient is a very sweet 83 year old Caucasian female who is here today with her daughter to establish care with me.  Family reports 2 to 3-year history of memory loss.  Patient has a strong family history of Alzheimer's disease and dementia.  Over the last 2 to 3 years, they have noticed that her mother's memory gradually worsening.  She reports that she has a difficult time doing tasks that she has always done.  She has problems with her balance.  She has problems with her short-term memory.  She also appears to have trouble finding words.  She speaks slowly and has a delayed response to questions.  Of note, the patient had an MRI last year.  I reviewed the results of that.  She also had a B12 level that was checked and was 159.  However not take any vitamin B12 supplement.  Over the last several months, she has been dealing with depression.  Her daughter thinks some of the depression could be stemming from dealing with her dementia and memory loss.  Patient reports anhedonia and feeling that she would be better off if she were not here.  She has even had suicidal thoughts.  She states that last week, she thought about cutting her wrist with a knife that she was not able to go through with it.  She told her husband and her children about her thoughts.  She states that after talking with them this last week she is feeling better.  She denies suicidal thoughts at the present time.  She denies any desire to act on them.  However she does report sadness.  At that time, my plan was: We discussed possibly going to the hospital due to her recent suicidal thoughts.  Patient request not to go to the hospital.  I am concerned that her confusion and delirium would likely worsen if she were hospitalized.  Patient does not report any suicidal thoughts today.  We have decided to start Lexapro  10 mg a day and  recheck in 4 weeks.  I am concerned that the patient may have severe vitamin B12 deficiency potentially contributing to her issues with balance and fatigue and memory loss.  I am going to repeat her lab work today and if B12 remains low I plan to start the patient on parenteral B12 1000 mcg subcu weekly x 1 month and then monthly thereafter  06/06/24 Patient is due for her 4th b12 shot today.  Family has not seen any improvement with regards to balance or memory since starting B12 supplementation.  She is here today to complete the first month of replacement.  Fortunately the depression seems to be slightly better.  The family denies any suicidal thoughts.  Patient states that she is feeling better and less depressed and anxious.  However, I have to repeat the answers to questions multiple times.  There is obviously memory impairment Past Medical History:  Diagnosis Date   Arthritis    Atrial fibrillation [facility])    Cataract    Dysrhythmia    04/2015 with atrial fibrillation   Glaucoma    History of echocardiogram    Echo 1/19: EF 60-65, no RWMA, normal diastolic function, mild to mod AI, MAC, mild to mod MR, mild BAE, mod TR, PASP 40   Past Surgical History:  Procedure Laterality Date   A-FLUTTER ABLATION  N/A 10/02/2019   Procedure: A-FLUTTER ABLATION;  Surgeon: Tammie Fall, MD;  Location: [facility] INVASIVE CV LAB;  Service: Cardiovascular;  Laterality: N/A;   APPENDECTOMY     83 years of age   CARDIOVERSION N/A 10/03/2015   Procedure: CARDIOVERSION;  Surgeon: Flavia Hughs, MD;  Location: [facility] ORS;  Service: Cardiovascular;  Laterality: N/A;   COLONOSCOPY N/A 07/30/2016   Procedure: COLONOSCOPY;  Surgeon: Ruby Corporal, MD;  Location: [facility] ENDO SUITE;  Service: Endoscopy;  Laterality: N/A;  8:30   EYE SURGERY     left   HEMORROIDECTOMY     TONSILLECTOMY     childhood   Current Outpatient Medications on File Prior to Visit  Medication Sig Dispense Refill   acetaminophen  (TYLENOL ) 500 MG  tablet Take 1 tablet (500 mg total) by mouth every 6 (six) hours as needed. 30 tablet 0   apixaban  (ELIQUIS ) 2.5 MG TABS tablet Take 1 tablet by mouth twice daily 180 tablet 1   calcium-vitamin D (OSCAL WITH D) 500-200 MG-UNIT tablet Take 1 tablet by mouth 2 (two) times daily.      digoxin  (LANOXIN ) 0.125 MG tablet TAKE 1 TABLET BY MOUTH ONCE DAILY MONDAY  THRU  SATURDAY.  NONE  ON  SUNDAY 90 tablet 3   escitalopram  (LEXAPRO ) 10 MG tablet Take 1 tablet (10 mg total) by mouth daily. 30 tablet 5   latanoprost (XALATAN) 0.005 % ophthalmic solution Place 1 drop into both eyes at bedtime.     metoprolol  succinate (TOPROL -XL) 50 MG 24 hr tablet Take 1 tablet (50 mg total) by mouth daily. Take with or immediately following a meal. Please keep scheduled appointment for future refills. Thank you. 90 tablet 3   Netarsudil Dimesylate 0.02 % SOLN Apply to eye.     potassium chloride  (K-DUR,KLOR-CON ) 10 MEQ tablet TAKE 1 TABLET BY MOUTH ONLY ON DAYS YOUR TAKING LASIX  FOR SWELLING (Patient not taking: Reported on 05/09/2024) 90 tablet 3   timolol (TIMOPTIC) 0.5 % ophthalmic solution Place 1 drop into the left eye 2 (two) times daily.     No current facility-administered medications on file prior to visit.   Social History   Socioeconomic History   Marital status: Married    Spouse name: Not on file   Number of children: Not on file   Years of education: Not on file   Highest education level: Not on file  Occupational History   Not on file  Tobacco Use   Smoking status: Never   Smokeless tobacco: Never  Vaping Use   Vaping status: Never Used  Substance and Sexual Activity   Alcohol use: Yes    Alcohol/week: 7.0 standard drinks of alcohol    Types: 7 Glasses of wine per week    Comment: one glass of wine with dinner each night   Drug use: No   Sexual activity: Not Currently  Other Topics Concern   Not on file  Social History Narrative   Not on file   Social Drivers of Health   Financial  Resource Strain: Low Risk  (05/06/2023)   Overall Financial Resource Strain (CARDIA)    Difficulty of Paying Living Expenses: Not hard at all  Food Insecurity: No Food Insecurity (05/06/2023)   Hunger Vital Sign    Worried About Running Out of Food in the Last Year: Never true    Ran Out of Food in the Last Year: Never true  Transportation Needs: No Transportation Needs (05/06/2023)   PRAPARE - Transportation  Lack of Transportation (Medical): No    Lack of Transportation (Non-Medical): No  Physical Activity: Insufficiently Active (05/06/2023)   Exercise Vital Sign    Days of Exercise per Week: 4 days    Minutes of Exercise per Session: 10 min  Stress: No Stress Concern Present (05/06/2023)   [employer] of Occupational Health - Occupational Stress Questionnaire    Feeling of Stress : Not at all  Social Connections: Moderately Isolated (05/06/2023)   Social Connection and Isolation Panel [NHANES]    Frequency of Communication with Friends and Family: More than three times a week    Frequency of Social Gatherings with Friends and Family: More than three times a week    Attends Religious Services: Never    Database administrator or Organizations: No    Attends Banker Meetings: Never    Marital Status: Married  Catering manager Violence: Not At Risk (05/06/2023)   Humiliation, Afraid, Rape, and Kick questionnaire    Fear of Current or Ex-Partner: No    Emotionally Abused: No    Physically Abused: No    Sexually Abused: No   Family History  Problem Relation Age of Onset   Hearing loss Mother    Alzheimer's disease Father      Review of Systems  All other systems reviewed and are negative.      Objective:   Physical Exam Vitals reviewed.  Constitutional:      Appearance: Normal appearance. She is normal weight.  HENT:     Head: Normocephalic and atraumatic.  Cardiovascular:     Rate and Rhythm: Normal rate. Rhythm irregular.     Heart sounds: Normal heart  sounds. No murmur heard.    No friction rub. No gallop.  Pulmonary:     Effort: Pulmonary effort is normal. No respiratory distress.     Breath sounds: Normal breath sounds. No wheezing, rhonchi or rales.  Neurological:     General: No focal deficit present.     Mental Status: She is alert and oriented to person, place, and time. Mental status is at baseline.     Cranial Nerves: No cranial nerve deficit.     Sensory: No sensory deficit.     Motor: No weakness.     Coordination: Coordination normal.     Gait: Gait normal.  Psychiatric:        Mood and Affect: Mood normal. Affect is flat.        Speech: Speech is delayed.        Behavior: Behavior normal.        Thought Content: Thought content normal.        Cognition and Memory: Memory is impaired.        Judgment: Judgment normal.           Assessment & Plan:  B12 deficiency - Plan: cyanocobalamin  (VITAMIN B12) injection 1,000 mcg  Memory impairment of gradual onset  Depression, major, single episode, severe (HCC) I believe the patient likely has Alzheimer's disease.  B12 deficiency may be contributing but I do not believe is the sole source of her memory impairment.  Will continue parenteral B12 for 1 additional month and then likely switch to oral B12 supplementation unless the family notices significant improvement.  Continue Lexapro  as the depression seems to be doing some better.

## 2024-06-06 NOTE — Telephone Encounter (Signed)
 Pt notified by voicemail of refill sent to [facility] in [place].

## 2024-06-20 ENCOUNTER — Emergency Department (HOSPITAL_COMMUNITY)

## 2024-06-20 ENCOUNTER — Emergency Department (HOSPITAL_COMMUNITY)
Admission: EM | Admit: 2024-06-20 | Discharge: 2024-06-20 | Disposition: A | Discharge status: Home / Self Care | Attending: Emergency Medicine | Admitting: Emergency Medicine

## 2024-06-20 ENCOUNTER — Other Ambulatory Visit: Payer: Self-pay

## 2024-06-20 DIAGNOSIS — W01198A Fall on same level from slipping, tripping and stumbling with subsequent striking against other object, initial encounter: Secondary | ICD-10-CM | POA: Diagnosis not present

## 2024-06-20 DIAGNOSIS — Y9301 Activity, walking, marching and hiking: Secondary | ICD-10-CM | POA: Diagnosis not present

## 2024-06-20 DIAGNOSIS — R519 Headache, unspecified: Secondary | ICD-10-CM | POA: Diagnosis not present

## 2024-06-20 DIAGNOSIS — Z7901 Long term (current) use of anticoagulants: Secondary | ICD-10-CM | POA: Diagnosis not present

## 2024-06-20 DIAGNOSIS — Y92009 Unspecified place in unspecified non-institutional (private) residence as the place of occurrence of the external cause: Secondary | ICD-10-CM | POA: Diagnosis not present

## 2024-06-20 DIAGNOSIS — R109 Unspecified abdominal pain: Secondary | ICD-10-CM | POA: Insufficient documentation

## 2024-06-20 DIAGNOSIS — M545 Low back pain, unspecified: Secondary | ICD-10-CM | POA: Diagnosis present

## 2024-06-20 DIAGNOSIS — S32010A Wedge compression fracture of first lumbar vertebra, initial encounter for closed fracture: Secondary | ICD-10-CM | POA: Insufficient documentation

## 2024-06-20 DIAGNOSIS — S32020A Wedge compression fracture of second lumbar vertebra, initial encounter for closed fracture: Secondary | ICD-10-CM | POA: Insufficient documentation

## 2024-06-20 MED ORDER — OXYCODONE-ACETAMINOPHEN 5-325 MG PO TABS: 0.5000 | ORAL_TABLET | Freq: Four times a day (QID) | ORAL | 0 refills | Status: DC | PRN

## 2024-06-20 MED ORDER — FENTANYL CITRATE PF 50 MCG/ML IJ SOSY
25.0000 ug | PREFILLED_SYRINGE | Freq: Once | INTRAMUSCULAR | Status: AC
  Administered 2024-06-20: 25 ug via INTRAMUSCULAR
  Filled 2024-06-20: qty 1

## 2024-06-20 MED ORDER — HYDROCODONE-ACETAMINOPHEN 5-325 MG PO TABS: 0.5000 | ORAL_TABLET | Freq: Four times a day (QID) | ORAL | 0 refills | Status: DC | PRN

## 2024-06-20 MED ORDER — ONDANSETRON 4 MG PO TBDP
4.0000 mg | ORAL_TABLET | Freq: Once | ORAL | Status: AC
Start: 2024-06-20 — End: 2024-06-20
  Administered 2024-06-20: 4 mg via ORAL
  Filled 2024-06-20: qty 1

## 2024-06-20 MED ORDER — HYDROCODONE-ACETAMINOPHEN 5-325 MG PO TABS
1.0000 | ORAL_TABLET | Freq: Once | ORAL | Status: AC
  Administered 2024-06-20: 1 via ORAL
  Filled 2024-06-20: qty 1

## 2024-06-20 NOTE — ED Triage Notes (Addendum)
 Pt bib ems from home for fall and complains of lower back pain, pt tripped over a dog toy while getting up from couch. Denies LOC but pt did hit head on the floor. Denies weakness/dizziness. Pt has cardiac hx, hx of alzheimer's and takes blood thinners.. VSS, AAOX3,

## 2024-06-20 NOTE — ED Notes (Signed)
 Called dietary they said tray would be up shortly

## 2024-06-20 NOTE — Discharge Instructions (Signed)
 Please continue to wear the brace until evaluated by neurosurgery.  Please wear the brace while sitting or standing.  You may remove it for lying or taking a shower.  Please return to the emergency department immediately for any new or worsening symptoms to include pain that is not controlled at home, numbness, tingling, weakness in your legs, or inability to control your bladder or bowels.

## 2024-06-20 NOTE — ED Provider Notes (Signed)
 [place] EMERGENCY DEPARTMENT AT [facility] Provider Note   CSN: [id number] Arrival date & time: 06/20/24  1527     Patient presents with: Back Pain and Fall   Kelly Dougherty is a 83 y.o. female.   Patient is an 83 year old female who presents emergency department noting that she had a ground-level fall just prior to arrival.  She notes that she was walking to her home when she tripped over a dog toy landing on her back.  She did strike her head during the fall.  She notes that there was no preceding dizziness, lightheadedness, syncope, chest pain, shortness of breath or palpitations.  She is currently on Eliquis  at this time.  She denies any other long bone or joint pain.  She denies any chest pain or abdominal pain at this time.  She is complaining of pain to her lower back.   Back Pain Fall       Prior to Admission medications   Medication Sig Start Date End Date Taking? Authorizing Provider  apixaban  (ELIQUIS ) 2.5 MG TABS tablet Take 1 tablet by mouth twice daily 04/24/24  Yes Waddell Danelle ORN, MD  calcium-vitamin D (OSCAL WITH D) 500-200 MG-UNIT tablet Take 1 tablet by mouth 2 (two) times daily.    Yes [provider]  cyanocobalamin  (VITAMIN B12) 1000 MCG/ML injection Inject 1 mL (1,000 mcg total) into the muscle once a week. 06/06/24  Yes Duanne Butler DASEN, MD  digoxin  (LANOXIN ) 0.125 MG tablet TAKE 1 TABLET BY MOUTH ONCE DAILY MONDAY  THRU  SATURDAY.  NONE  ON  SUNDAY 01/24/24  Yes Waddell Danelle ORN, MD  dorzolamide (TRUSOPT) 2 % ophthalmic solution Place 1 drop into both eyes every evening.   Yes [provider]  escitalopram  (LEXAPRO ) 10 MG tablet Take 1 tablet (10 mg total) by mouth daily. 05/09/24  Yes Duanne Butler DASEN, MD  metoprolol  succinate (TOPROL -XL) 50 MG 24 hr tablet Take 1 tablet (50 mg total) by mouth daily. Take with or immediately following a meal. Please keep scheduled appointment for future refills. Thank you. 12/21/23  Yes Waddell Danelle ORN, MD  Multiple Vitamins-Minerals (PRESERVISION AREDS PO) Take 1 capsule by mouth in the morning and at bedtime.   Yes [provider]  Netarsudil Dimesylate 0.02 % SOLN Place 1 drop into the left eye every evening. Rhopressa 07/31/20  Yes [provider]  timolol (TIMOPTIC) 0.5 % ophthalmic solution Place 1 drop into both eyes every evening. 10/25/19  Yes [provider]    Allergies: Patient has no known allergies.    Review of Systems  Musculoskeletal:  Positive for back pain.  All other systems reviewed and are negative.   Updated Vital Signs BP 118/65   Pulse 84   Temp 97.7 F (36.5 C) (Oral)   Resp (!) 22   Ht 5' 6 (1.676 m)   Wt 53.8 kg   SpO2 94%   BMI 19.14 kg/m   Physical Exam Vitals and nursing note reviewed.  Constitutional:      Appearance: Normal appearance.  HENT:     Head: Normocephalic and atraumatic.     Nose: Nose normal.     Mouth/Throat:     Mouth: Mucous membranes are moist.   Eyes:     Extraocular Movements: Extraocular movements intact.     Conjunctiva/sclera: Conjunctivae normal.     Pupils: Pupils are equal, round, and reactive to light.    Cardiovascular:     Rate and  Rhythm: Normal rate and regular rhythm.     Pulses: Normal pulses.     Heart sounds: Normal heart sounds. No murmur heard.    No gallop.  Pulmonary:     Effort: Pulmonary effort is normal. No respiratory distress.     Breath sounds: Normal breath sounds. No stridor. No wheezing, rhonchi or rales.  Chest:     Chest wall: No tenderness.  Abdominal:     General: Abdomen is flat. Bowel sounds are normal. There is no distension.     Palpations: Abdomen is soft.     Tenderness: There is no abdominal tenderness. There is no guarding.   Musculoskeletal:        General: Normal range of motion.     Cervical back: Normal range of motion and neck supple. No rigidity or tenderness.     Comments: Tender to palpation over lumbar spine, nontender  palpation of the thoracic spine, no step-off or deformity, no CVA tenderness, tenderness palpation noted along posterior pelvis, pelvis is stable to AP lateral compression, nontender palpation remainder bilateral upper and lower extremities, full range of motion noted throughout, no obvious deformity or bruising, no skin breakdown or ulceration, no lacerations or abrasions, peripheral pulses 2+ throughout, sensation intact distally   Skin:    General: Skin is warm and dry.     Findings: No bruising or rash.   Neurological:     General: No focal deficit present.     Mental Status: She is alert and oriented to person, place, and time. Mental status is at baseline.     Cranial Nerves: No cranial nerve deficit.     Sensory: No sensory deficit.     Motor: No weakness.     Coordination: Coordination normal.     Gait: Gait normal.   Psychiatric:        Mood and Affect: Mood normal.        Behavior: Behavior normal.        Thought Content: Thought content normal.        Judgment: Judgment normal.     (all labs ordered are listed, but only abnormal results are displayed) Labs Reviewed - No data to display  EKG: None  Radiology: No results found.   Procedures   Medications Ordered in the ED  HYDROcodone-acetaminophen  (NORCO/VICODIN) 5-325 MG per tablet 1 tablet (1 tablet Oral Given 06/20/24 1615)  ondansetron  (ZOFRAN -ODT) disintegrating tablet 4 mg (4 mg Oral Given 06/20/24 1615)                                    Medical Decision Making Amount and/or Complexity of Data Reviewed Radiology: ordered.  Risk Prescription drug management.   This patient presents to the ED for concern of fall, back pain differential diagnosis includes fracture, sprain, strain, retroperitoneal hemorrhage, cauda equina syndrome, vertebral osteomyelitis, epidural abscess, intracranial hemorrhage    Additional history obtained:  Additional history obtained from family External records from outside  source obtained and reviewed including none    Imaging Studies ordered:  I ordered imaging studies including CT scan of head, cervical spine, lumbar spine, pelvis I independently visualized and interpreted imaging which showed no acute intracranial hemorrhage, no cervical spine fracture, compression fracture of L1 and L2, no acute traumatic injury within the pelvis I agree with the radiologist interpretation   Medicines ordered and prescription drug management:  I ordered medication including Norco, fentanyl  for  acute traumatic pain Reevaluation of the patient after these medicines showed that the patient improved I have reviewed the patients home medicines and have made adjustments as needed   Problem List / ED Course:  Patient is doing well at this time and is stable for discharge home.  Discussed with patient and family that she does have an acute fracture to her L1 and L2.  Patient has no concerning neurological deficits at this time and is able to move all 4 extremities without difficulty with no associated numbness or paresthesias.  Patient has no tenderness over her bilateral flanks or abdomen do not suspect to intra-abdominal hemorrhage at this point or retroperitoneal hemorrhage.  CT scan of the head demonstrated no signs of acute intracranial traumatic pathology.  She had no fracture noted within the CT scan of the cervical spine or pelvis.  She was nontender palpation of her bilateral upper and lower extremities.  She had no tenderness over her chest wall.  Discussed with patient and family that we will place the patient in a TLSO brace at this time and recommend close follow-up with neurosurgery on an outpatient basis.  Did discuss the need for when to wear the brace with the patient and family.  Strict turn precautions were discussed for any new or worsening symptoms or if any neurological changes are noted.  Patient and family voiced understand to the plan and had no additional  questions. Imaging findings were discussed with attending physician who is in agreement to plan at this time.    Social Determinants of Health:  None        Final diagnoses:  None    ED Discharge Orders     None          Daralene Lonni JONETTA DEVONNA 06/20/24 GUILLERMO    Melvenia Motto, MD 06/20/24 2356

## 2024-06-20 NOTE — ED Notes (Signed)
 Patient given prepack, discharge paperwork went over with daughter, and prepack paperwork signed and placed in bin at pyxis

## 2024-06-20 NOTE — ED Notes (Signed)
 [employer] has been pages and notified that brace needs to be applied to patient.

## 2024-06-21 MED FILL — Oxycodone w/ Acetaminophen Tab 5-325 MG: ORAL | Qty: 6 | Status: AC

## 2024-06-22 ENCOUNTER — Telehealth: Payer: Self-pay

## 2024-06-22 ENCOUNTER — Ambulatory Visit: Payer: Self-pay | Admitting: *Deleted

## 2024-06-22 ENCOUNTER — Other Ambulatory Visit: Payer: Self-pay | Admitting: Family Medicine

## 2024-06-22 MED ORDER — CALCITONIN (SALMON) 200 UNIT/ACT NA SOLN: 1.0000 | Freq: Every day | NASAL | 3 refills | Status: AC

## 2024-06-22 MED ORDER — HYDROMORPHONE HCL 2 MG PO TABS: 2.0000 mg | ORAL_TABLET | ORAL | 0 refills | Status: DC | PRN

## 2024-06-22 NOTE — Telephone Encounter (Signed)
 Copied from CRM [id number]. Topic: Clinical - Red Word Triage >> Jun 22, 2024  8:20 AM Elle L wrote: Red Word that prompted transfer to Nurse Triage: The patient's daughter states that the patient went to the Emergency Room on the 24th due to a fall and she has compression fractures on her tailbone. She has been taking oxyCODONE -acetaminophen  (PERCOCET/ROXICET) 5-325 MG tablet half a pill every 6 hours but she is still experiencing pain. The patient has dementia and the patient's daughter states this is worsening her depression. Reason for Disposition  [1] Caller has URGENT medicine question about med that PCP or specialist prescribed AND [2] triager unable to answer question  Answer Assessment - Initial Assessment Questions 1. NAME of MEDICINE: What medicine(s) are you calling about?     Pain med from ED.    Oxycodone  was given to her.   She fell Tues. And has compression fx on L1 and L2.   She has a back brace on.   They gave her medicine for pain.   Oxycodone -Tylenol .   Give her 1/2 pill every 6 hrs   She also has hydrocodone with Tylenol  in it.     She has been in constant pain.   It's hard to get her up to bathroom.     2. QUESTION: What is your question? (e.g., double dose of medicine, side effect)   Does she need a different medication or different dose?   She has dementia and depression.   This is not helping at all.   She's really hurting.   Also having nausea too.   Dr. Duanne knows about her condition of dementia and depression. 3. PRESCRIBER: Who prescribed the medicine? Reason: if prescribed by specialist, call should be referred to that group.     Provider in ED 4. SYMPTOMS: Do you have any symptoms? If Yes, ask: What symptoms are you having?  How bad are the symptoms (e.g., mild, moderate, severe)     She is in severe pain.    I'm not sure if we can get her in due to the compression fractures.   She has a follow up with Dr. Louis on July 13, 2024.     She is in a back  brace. She is needing help with daily activities.    Getting the pain under control would help, I think.      5. PREGNANCY:  Is there any chance that you are pregnant? When was your last menstrual period?     N/A  Protocols used: Medication Question Call-A-AH  Pt fell on 06/20/2024.  Went to ED.  Has a compression fracture of L1 and L2 per Verneita, daughter, on [place].  She is taking oxycodone  and hydrocodone but it is not helping with the pain.   Pt. Has dementia and depression and these pain medications are not helping in regards to that.   Verneita seeking advice on getting her mother's pain under control with these medications but yet dealing with the dementia and depression.   Last night was rough due to the sundowners.   Unable to get her into the office.   Pt is in a big back brace.   Too difficult to move her to get her to the office.    Please call Verneita at [phone].        FYI Only or Action Required?: Action required by provider: clinical question for provider.  Patient was last seen in primary care on 06/06/2024 by Duanne Butler DASEN, MD.  Called Nurse Triage reporting Medication Problem. Symptoms began several days ago. Interventions attempted: Prescription medications: oxycodone  and hydrocodone with Tylenol  in both given in the ED.  Symptoms are: gradually worsening Dealing with severe pain along with dementia and depression.   Seeking advice.  Triage Disposition: Call PCP Now  Patient/caregiver understands and will follow disposition?:

## 2024-06-22 NOTE — Telephone Encounter (Signed)
 Copied from CRM [id number]. Topic: Clinical - Medication Question >> Jun 22, 2024  3:18 PM DeAngela L wrote: Reason for CRM: The patient's daughter states she has additional questions about her mom medication and would like for Elida to give her a call back (she states the call showed spam call and she apologizes for missing the call) Pt daughter [phone]

## 2024-06-22 NOTE — Telephone Encounter (Signed)
 Spoke with pt daughter and answered all questions.

## 2024-06-26 ENCOUNTER — Other Ambulatory Visit (INDEPENDENT_AMBULATORY_CARE_PROVIDER_SITE_OTHER)

## 2024-06-26 DIAGNOSIS — Z111 Encounter for screening for respiratory tuberculosis: Secondary | ICD-10-CM

## 2024-06-26 NOTE — Addendum Note (Signed)
 Addended by: CORINNA JESUSA SAUNDERS on: 06/26/2024 04:27 PM   Modules accepted: Orders

## 2024-06-26 NOTE — Addendum Note (Signed)
 Addended by: CORINNA JESUSA SAUNDERS on: 06/26/2024 04:30 PM   Modules accepted: Orders

## 2024-06-28 LAB — QUANTIFERON-TB GOLD PLUS
Mitogen-NIL: 2.83 [IU]/mL
NIL: 0.03 [IU]/mL
QuantiFERON-TB Gold Plus: NEGATIVE
TB1-NIL: 0 [IU]/mL
TB2-NIL: 0 [IU]/mL

## 2024-06-29 ENCOUNTER — Ambulatory Visit: Payer: Self-pay | Admitting: Family Medicine

## 2024-06-29 NOTE — Progress Notes (Signed)
 Pt. Contacted by phone. Spoke to pt. Daughter. She stated that  Saw test results on My chart and are very pleased.

## 2024-07-03 ENCOUNTER — Ambulatory Visit (INDEPENDENT_AMBULATORY_CARE_PROVIDER_SITE_OTHER): Admitting: Family Medicine

## 2024-07-03 VITALS — BP 116/62 | HR 111 | Temp 98.0°F | Ht 66.0 in | Wt 119.4 lb

## 2024-07-03 DIAGNOSIS — R413 Other amnesia: Secondary | ICD-10-CM | POA: Diagnosis not present

## 2024-07-03 DIAGNOSIS — F322 Major depressive disorder, single episode, severe without psychotic features: Secondary | ICD-10-CM

## 2024-07-03 DIAGNOSIS — E538 Deficiency of other specified B group vitamins: Secondary | ICD-10-CM | POA: Diagnosis not present

## 2024-07-03 NOTE — Progress Notes (Signed)
 Subjective:    Patient ID: Kelly Dougherty, female    DOB: 22-Aug-1941, 83 y.o.   MRN: [id number]  HPI 05/09/24 Patient is a very sweet 83 year old Caucasian female who is here today with her daughter to establish care with me.  Family reports 2 to 3-year history of memory loss.  Patient has a strong family history of Alzheimer's disease and dementia.  Over the last 2 to 3 years, they have noticed that her mother's memory gradually worsening.  She reports that she has a difficult time doing tasks that she has always done.  She has problems with her balance.  She has problems with her short-term memory.  She also appears to have trouble finding words.  She speaks slowly and has a delayed response to questions.  Of note, the patient had an MRI last year.  I reviewed the results of that.  She also had a B12 level that was checked and was 159.  However not take any vitamin B12 supplement.  Over the last several months, she has been dealing with depression.  Her daughter thinks some of the depression could be stemming from dealing with her dementia and memory loss.  Patient reports anhedonia and feeling that she would be better off if she were not here.  She has even had suicidal thoughts.  She states that last week, she thought about cutting her wrist with a knife that she was not able to go through with it.  She told her husband and her children about her thoughts.  She states that after talking with them this last week she is feeling better.  She denies suicidal thoughts at the present time.  She denies any desire to act on them.  However she does report sadness.  At that time, my plan was: We discussed possibly going to the hospital due to her recent suicidal thoughts.  Patient request not to go to the hospital.  I am concerned that her confusion and delirium would likely worsen if she were hospitalized.  Patient does not report any suicidal thoughts today.  We have decided to start Lexapro  10 mg a day and  recheck in 4 weeks.  I am concerned that the patient may have severe vitamin B12 deficiency potentially contributing to her issues with balance and fatigue and memory loss.  I am going to repeat her lab work today and if B12 remains low I plan to start the patient on parenteral B12 1000 mcg subcu weekly x 1 month and then monthly thereafter  06/06/24 Patient is due for her 4th b12 shot today.  Family has not seen any improvement with regards to balance or memory since starting B12 supplementation.  She is here today to complete the first month of replacement.  Fortunately the depression seems to be slightly better.  The family denies any suicidal thoughts.  Patient states that she is feeling better and less depressed and anxious.  However, I have to repeat the answers to questions multiple times.  There is obviously memory impairment.  At that time, my plan was: I believe the patient likely has Alzheimer's disease.  B12 deficiency may be contributing but I do not believe is the sole source of her memory impairment.  Will continue parenteral B12 for 1 additional month and then likely switch to oral B12 supplementation unless the family notices significant improvement.  Continue Lexapro  as the depression seems to be doing some better.  07/03/24 Recently fell and fractured L1 and L2.  Patient is currently wearing a TLSO brace.  She has been able to wean off the Dilaudid .  She does not feel that the calcitonin is helping the pain instead she is taking Tylenol  1000 mg in the morning and at 1000 mg in the evening.  This seems to be managing her pain quite well.  Unfortunately she has not seen any improvement in her memory since starting parenteral B12.  Family would like to transition to oral B12 for convenience.  They are also pursuing skilled nursing facility placement in a memory care unit because they do not feel that they are able to provide adequate care at home.  The patient needs assistance with grooming,  toileting, dressing, and getting in and out of bed.  She is currently ambulating using a rollator with a seat.  She is a high fall risk.  For all these reasons, they feel that the patient requires SNF placement and I would concur.  Fortunately they do feel that the Lexapro  is helping with the depression Past Medical History:  Diagnosis Date   Arthritis    Atrial fibrillation (HCC)    B12 deficiency    Cataract    Dementia (HCC)    Depression    Dysrhythmia    04/2015 with atrial fibrillation   Glaucoma    History of echocardiogram    Echo 1/19: EF 60-65, no RWMA, normal diastolic function, mild to mod AI, MAC, mild to mod MR, mild BAE, mod TR, PASP 40   Past Surgical History:  Procedure Laterality Date   A-FLUTTER ABLATION N/A 10/02/2019   Procedure: A-FLUTTER ABLATION;  Surgeon: Waddell Danelle ORN, MD;  Location: [facility];  Service: Cardiovascular;  Laterality: N/A;   APPENDECTOMY     83 years of age   CARDIOVERSION N/A 10/03/2015   Procedure: CARDIOVERSION;  Surgeon: Pearla DELENA Rout, MD;  Location: [facility];  Service: Cardiovascular;  Laterality: N/A;   COLONOSCOPY N/A 07/30/2016   Procedure: COLONOSCOPY;  Surgeon: Claudis RAYMOND Rivet, MD;  Location: [facility];  Service: Endoscopy;  Laterality: N/A;  8:30   EYE SURGERY     left   HEMORROIDECTOMY     TONSILLECTOMY     childhood   Current Outpatient Medications on File Prior to Visit  Medication Sig Dispense Refill   apixaban  (ELIQUIS ) 2.5 MG TABS tablet Take 1 tablet by mouth twice daily 180 tablet 1   calcitonin, salmon, (MIACALCIN/FORTICAL) 200 UNIT/ACT nasal spray Place 1 spray into alternate nostrils daily. 3.7 mL 3   calcium-vitamin D (OSCAL WITH D) 500-200 MG-UNIT tablet Take 1 tablet by mouth 2 (two) times daily.      cyanocobalamin  (VITAMIN B12) 1000 MCG/ML injection Inject 1 mL (1,000 mcg total) into the muscle once a week. 4 mL 0   digoxin  (LANOXIN ) 0.125 MG tablet TAKE 1 TABLET BY MOUTH ONCE DAILY MONDAY  THRU   SATURDAY.  NONE  ON  SUNDAY 90 tablet 3   dorzolamide (TRUSOPT) 2 % ophthalmic solution Place 1 drop into both eyes every evening.     escitalopram  (LEXAPRO ) 10 MG tablet Take 1 tablet (10 mg total) by mouth daily. 30 tablet 5   HYDROmorphone  (DILAUDID ) 2 MG tablet Take 1 tablet (2 mg total) by mouth every 4 (four) hours as needed for severe pain (pain score 7-10) (stop oxycodone  and hydrocodone ). 30 tablet 0   metoprolol  succinate (TOPROL -XL) 50 MG 24 hr tablet Take 1 tablet (50 mg total) by mouth daily. Take with or immediately  following a meal. Please keep scheduled appointment for future refills. Thank you. 90 tablet 3   Multiple Vitamins-Minerals (PRESERVISION AREDS PO) Take 1 capsule by mouth in the morning and at bedtime.     Netarsudil Dimesylate 0.02 % SOLN Place 1 drop into the left eye every evening. Rhopressa     timolol (TIMOPTIC) 0.5 % ophthalmic solution Place 1 drop into both eyes every evening.     No current facility-administered medications on file prior to visit.   Social History   Socioeconomic History   Marital status: Married    Spouse name: Not on file   Number of children: Not on file   Years of education: Not on file   Highest education level: Some college, no degree  Occupational History   Not on file  Tobacco Use   Smoking status: Never   Smokeless tobacco: Never  Vaping Use   Vaping status: Never Used  Substance and Sexual Activity   Alcohol use: Yes    Alcohol/week: 7.0 standard drinks of alcohol    Types: 7 Glasses of wine per week    Comment: one glass of wine with dinner each night   Drug use: No   Sexual activity: Not Currently  Other Topics Concern   Not on file  Social History Narrative   Not on file   Social Drivers of Health   Financial Resource Strain: Low Risk  (07/03/2024)   Overall Financial Resource Strain (CARDIA)    Difficulty of Paying Living Expenses: Not hard at all  Food Insecurity: No Food Insecurity (07/03/2024)   Hunger Vital  Sign    Worried About Running Out of Food in the Last Year: Never true    Ran Out of Food in the Last Year: Never true  Transportation Needs: No Transportation Needs (07/03/2024)   PRAPARE - Administrator, Civil Service (Medical): No    Lack of Transportation (Non-Medical): No  Physical Activity: Inactive (07/03/2024)   Exercise Vital Sign    Days of Exercise per Week: 0 days    Minutes of Exercise per Session: Not on file  Stress: Stress Concern Present (07/03/2024)   [employer] of Occupational Health - Occupational Stress Questionnaire    Feeling of Stress: To some extent  Social Connections: Moderately Isolated (07/03/2024)   Social Connection and Isolation Panel    Frequency of Communication with Friends and Family: More than three times a week    Frequency of Social Gatherings with Friends and Family: More than three times a week    Attends Religious Services: Patient declined    Database administrator or Organizations: No    Attends Engineer, structural: Not on file    Marital Status: Married  Catering manager Violence: Not At Risk (05/06/2023)   Humiliation, Afraid, Rape, and Kick questionnaire    Fear of Current or Ex-Partner: No    Emotionally Abused: No    Physically Abused: No    Sexually Abused: No   Family History  Problem Relation Age of Onset   Hearing loss Mother    Alzheimer's disease Father      Review of Systems  All other systems reviewed and are negative.      Objective:   Physical Exam Vitals reviewed.  Constitutional:      Appearance: Normal appearance. She is normal weight.  HENT:     Head: Normocephalic and atraumatic.  Cardiovascular:     Rate and Rhythm: Normal rate. Rhythm irregular.  Heart sounds: Normal heart sounds. No murmur heard.    No friction rub. No gallop.  Pulmonary:     Effort: Pulmonary effort is normal. No respiratory distress.     Breath sounds: Normal breath sounds. No wheezing, rhonchi or rales.   Neurological:     General: No focal deficit present.     Mental Status: She is alert and oriented to person, place, and time. Mental status is at baseline.     Cranial Nerves: No cranial nerve deficit.     Sensory: No sensory deficit.     Motor: No weakness.     Coordination: Coordination normal.     Gait: Gait normal.  Psychiatric:        Mood and Affect: Mood normal. Affect is flat.        Speech: Speech is delayed.        Behavior: Behavior normal.        Thought Content: Thought content normal.        Cognition and Memory: Memory is impaired.        Judgment: Judgment normal.           Assessment & Plan:  Memory impairment of gradual onset  Depression, major, single episode, severe (HCC)  B12 deficiency I completed an [place] 2 form for skilled nursing facility.  We will place the patient on Tylenol  1000 mg every 8 hours for pain control.  She can use this as needed.  We will continue B12 but use of 1000 mcg daily orally instead of parenteral.  This does not seem to help at all with her dementia.  Patient's TB test was negative.  Fortunately she no longer requires calcitonin or Dilaudid .  We will discontinue these medications.

## 2024-07-11 ENCOUNTER — Emergency Department (HOSPITAL_COMMUNITY)

## 2024-07-11 ENCOUNTER — Encounter (HOSPITAL_COMMUNITY): Payer: Self-pay

## 2024-07-11 ENCOUNTER — Ambulatory Visit: Admitting: Family Medicine

## 2024-07-11 ENCOUNTER — Other Ambulatory Visit: Payer: Self-pay

## 2024-07-11 ENCOUNTER — Emergency Department (HOSPITAL_COMMUNITY)
Admission: EM | Admit: 2024-07-11 | Discharge: 2024-07-11 | Disposition: A | Discharge status: Home / Self Care | Attending: Emergency Medicine | Admitting: Emergency Medicine

## 2024-07-11 DIAGNOSIS — M25551 Pain in right hip: Secondary | ICD-10-CM | POA: Insufficient documentation

## 2024-07-11 DIAGNOSIS — S161XXA Strain of muscle, fascia and tendon at neck level, initial encounter: Secondary | ICD-10-CM | POA: Insufficient documentation

## 2024-07-11 DIAGNOSIS — N3000 Acute cystitis without hematuria: Secondary | ICD-10-CM | POA: Insufficient documentation

## 2024-07-11 DIAGNOSIS — I4891 Unspecified atrial fibrillation: Secondary | ICD-10-CM | POA: Diagnosis not present

## 2024-07-11 DIAGNOSIS — Z7901 Long term (current) use of anticoagulants: Secondary | ICD-10-CM | POA: Insufficient documentation

## 2024-07-11 DIAGNOSIS — S0990XA Unspecified injury of head, initial encounter: Secondary | ICD-10-CM | POA: Insufficient documentation

## 2024-07-11 DIAGNOSIS — W19XXXA Unspecified fall, initial encounter: Secondary | ICD-10-CM | POA: Insufficient documentation

## 2024-07-11 DIAGNOSIS — M545 Low back pain, unspecified: Secondary | ICD-10-CM | POA: Diagnosis not present

## 2024-07-11 DIAGNOSIS — F0390 Unspecified dementia without behavioral disturbance: Secondary | ICD-10-CM | POA: Diagnosis not present

## 2024-07-11 DIAGNOSIS — S199XXA Unspecified injury of neck, initial encounter: Secondary | ICD-10-CM | POA: Diagnosis present

## 2024-07-11 LAB — CK: Total CK: 78 U/L (ref 38–234)

## 2024-07-11 LAB — CBC WITH DIFFERENTIAL/PLATELET
Abs Immature Granulocytes: 0.02 K/uL (ref 0.00–0.07)
Basophils Absolute: 0 K/uL (ref 0.0–0.1)
Basophils Relative: 0 %
Eosinophils Absolute: 0.1 K/uL (ref 0.0–0.5)
Eosinophils Relative: 1 %
HCT: 39.2 % (ref 36.0–46.0)
Hemoglobin: 12.9 g/dL (ref 12.0–15.0)
Immature Granulocytes: 0 %
Lymphocytes Relative: 17 %
Lymphs Abs: 1.1 K/uL (ref 0.7–4.0)
MCH: 30.9 pg (ref 26.0–34.0)
MCHC: 32.9 g/dL (ref 30.0–36.0)
MCV: 93.8 fL (ref 80.0–100.0)
Monocytes Absolute: 0.6 K/uL (ref 0.1–1.0)
Monocytes Relative: 9 %
Neutro Abs: 4.8 K/uL (ref 1.7–7.7)
Neutrophils Relative %: 73 %
Platelets: 270 K/uL (ref 150–400)
RBC: 4.18 MIL/uL (ref 3.87–5.11)
RDW: 12.7 % (ref 11.5–15.5)
WBC: 6.7 K/uL (ref 4.0–10.5)
nRBC: 0 % (ref 0.0–0.2)

## 2024-07-11 LAB — URINALYSIS, W/ REFLEX TO CULTURE (INFECTION SUSPECTED)
Bilirubin Urine: NEGATIVE
Glucose, UA: NEGATIVE mg/dL
Hgb urine dipstick: NEGATIVE
Ketones, ur: NEGATIVE mg/dL
Nitrite: POSITIVE — AB
Protein, ur: 100 mg/dL — AB
Specific Gravity, Urine: 1.009 (ref 1.005–1.030)
WBC, UA: >50 WBC/hpf (ref 0–5)
pH: 8 (ref 5.0–8.0)

## 2024-07-11 LAB — COMPREHENSIVE METABOLIC PANEL WITH GFR
ALT: 22 U/L (ref 0–44)
AST: 29 U/L (ref 15–41)
Albumin: 3.1 g/dL — ABNORMAL LOW (ref 3.5–5.0)
Alkaline Phosphatase: 115 U/L (ref 38–126)
Anion gap: 9 (ref 5–15)
BUN: 9 mg/dL (ref 8–23)
CO2: 25 mmol/L (ref 22–32)
Calcium: 9 mg/dL (ref 8.9–10.3)
Chloride: 103 mmol/L (ref 98–111)
Creatinine, Ser: 0.7 mg/dL (ref 0.44–1.00)
GFR, Estimated: >60 mL/min (ref >60)
Glucose, Bld: 102 mg/dL — ABNORMAL HIGH (ref 70–99)
Potassium: 4 mmol/L (ref 3.5–5.1)
Sodium: 137 mmol/L (ref 135–145)
Total Bilirubin: 0.9 mg/dL (ref 0.0–1.2)
Total Protein: 6.8 g/dL (ref 6.5–8.1)

## 2024-07-11 MED ORDER — SODIUM CHLORIDE 0.9 % IV BOLUS
500.0000 mL | Freq: Once | INTRAVENOUS | Status: AC
  Administered 2024-07-11: 500 mL via INTRAVENOUS

## 2024-07-11 MED ORDER — SODIUM CHLORIDE 0.9 % IV SOLN
1.0000 g | Freq: Once | INTRAVENOUS | Status: AC
  Administered 2024-07-11: 1 g via INTRAVENOUS
  Filled 2024-07-11: qty 10

## 2024-07-11 MED ORDER — CEPHALEXIN 500 MG PO CAPS: 500.0000 mg | ORAL_CAPSULE | Freq: Two times a day (BID) | ORAL | 0 refills | Status: AC

## 2024-07-11 MED ORDER — ACETAMINOPHEN 500 MG PO TABS
1000.0000 mg | ORAL_TABLET | Freq: Once | ORAL | Status: AC
  Administered 2024-07-11: 1000 mg via ORAL
  Filled 2024-07-11: qty 2

## 2024-07-11 NOTE — ED Triage Notes (Signed)
 Pt bib ems from [facility]. Pt states she fell last night, unwitnessed fall and got herself back into bed. Unknown time down. Pt states she hit her head. No abnormalities. On eliquis . Stating 10/10 R hip pain. Previous compression fx L1, L2 from three weeks. Also c/o of low back pain. No bruising to hip

## 2024-07-11 NOTE — Discharge Instructions (Addendum)
 You have been seen in the Emergency Department (ED) today for a fall.  Your work up does not show any concerning injuries.  Please take over-the-counter Tylenol  as needed for your pain (unless you have an allergy or your doctor as told you not to take them), or take any prescribed medication as instructed.  Please follow up with your doctor regarding today's Emergency Department (ED) visit and your recent fall.   We are starting treatment for a urinary tract infection.  Please take the antibiotics as prescribed.  We will call if you need to change antibiotics due to culture results.  Return to the ED if you have any headache, confusion, slurred speech, weakness/numbness of any arm or leg, or any increased pain.

## 2024-07-11 NOTE — ED Provider Notes (Signed)
 Emergency Department Provider Note   I have reviewed the triage vital signs and the nursing notes.   HISTORY  Chief Complaint Fall   HPI Kelly Dougherty is a 83 y.o. female past history of A-fib on Eliquis  presents to the emergency department from memory care after a fall sometime last night.  The fall was not discovered until this morning by staff.  She was apparently able to get up and back into bed but spent an unknown time on the floor.  Patient does not recall the incident last night.  She is on Eliquis  and was complaining of headache this morning along with right hip pain.  Patient also complaining of low back pain.  EMS reports that she has a known L1/L2 compression fracture from a fall several weeks ago.   Level 5 caveat: Dementia   Past Medical History:  Diagnosis Date   Arthritis    Atrial fibrillation (HCC)    B12 deficiency    Cataract    Dementia (HCC)    Depression    Dysrhythmia    04/2015 with atrial fibrillation   Glaucoma    History of echocardiogram    Echo 1/19: EF 60-65, no RWMA, normal diastolic function, mild to mod AI, MAC, mild to mod MR, mild BAE, mod TR, PASP 40    Review of Systems  Level 5 caveat: Dementia   ____________________________________________   PHYSICAL EXAM:  VITAL SIGNS: Vitals:   07/11/24 1430 07/11/24 1439  BP: 101/61   Pulse: 84   Resp: 19   Temp:  97.8 F (36.6 C)  SpO2: 100%    Constitutional: Alert but confused (baseline per SNF staff). Well appearing and in no acute distress. Eyes: Conjunctivae are normal. PERRL (4 mm reactive bilaterally).  Head: Atraumatic. Nose: No congestion/rhinnorhea. Mouth/Throat: Mucous membranes are moist.  Neck: No stridor.   Cardiovascular: Normal rate, regular rhythm. Good peripheral circulation. Grossly normal heart sounds.   Respiratory: Normal respiratory effort.  No retractions. Lungs CTAB. Gastrointestinal: Soft and nontender. No distention.  Musculoskeletal: No lower  extremity tenderness nor edema. No gross deformities of extremities. Normal ROM of the bilateral hips/knees/shoulders/wrists.  Neurologic:  Normal speech and language. No gross focal neurologic deficits are appreciated.  Skin:  Skin is warm, dry and intact. No rash noted.   ____________________________________________   LABS (all labs ordered are listed, but only abnormal results are displayed)  Labs Reviewed  URINE CULTURE - Abnormal; Notable for the following components:      Result Value   Culture >=100,000 COLONIES/mL ESCHERICHIA COLI (*)    Organism ID, Bacteria ESCHERICHIA COLI (*)    All other components within normal limits  COMPREHENSIVE METABOLIC PANEL WITH GFR - Abnormal; Notable for the following components:   Glucose, Bld 102 (*)    Albumin 3.1 (*)    All other components within normal limits  URINALYSIS, W/ REFLEX TO CULTURE (INFECTION SUSPECTED) - Abnormal; Notable for the following components:   APPearance CLOUDY (*)    Protein, ur 100 (*)    Nitrite POSITIVE (*)    Leukocytes,Ua LARGE (*)    Bacteria, UA MANY (*)    Non Squamous Epithelial 0-5 (*)    All other components within normal limits  CBC WITH DIFFERENTIAL/PLATELET  CK   ____________________________________________  EKG   EKG Interpretation Date/Time:  Tuesday July 11 2024 10:37:39 EDT Ventricular Rate:  93 PR Interval:    QRS Duration:  86 QT Interval:  328 QTC Calculation: 408 R Axis:  90  Text Interpretation: Atrial fibrillation Ventricular premature complex Anterior infarct, age indeterminate Confirmed by Darra Chew [phone]) on 07/11/2024 10:39:29 AM        ____________________________________________   PROCEDURES  Procedure(s) performed:   Procedures  None ____________________________________________   INITIAL IMPRESSION / ASSESSMENT AND PLAN / ED COURSE  Pertinent labs & imaging results that were available during my care of the patient were reviewed by me and considered  in my medical decision making (see chart for details).   This patient is Presenting for Evaluation of fall, which does require a range of treatment options, and is a complaint that involves a high risk of morbidity and mortality.  The Differential Diagnoses includes subdural hematoma, epidural hematoma, acute concussion, traumatic subarachnoid hemorrhage, cerebral contusions, etc.   Critical Interventions-    Medications  sodium chloride  0.9 % bolus 500 mL (0 mLs Intravenous Stopped 07/11/24 1134)  acetaminophen  (TYLENOL ) tablet 1,000 mg (1,000 mg Oral Given 07/11/24 1142)  cefTRIAXone  (ROCEPHIN ) 1 g in sodium chloride  0.9 % 100 mL IVPB (0 g Intravenous Stopped 07/11/24 1433)    Reassessment after intervention:  symptoms improved.    I did obtain Additional Historical Information from EMS at bedside, as the patient is confused.   Clinical Laboratory Tests Ordered, included UA with evidence of UTI.  No SIRS or sepsis vitals or suspicion.  CBC without leukocytosis.  CK normal.  No acute kidney injury.  Radiologic Tests Ordered, included CT head, CXR, pelvis XR. I independently interpreted the images and agree with radiology interpretation.   Cardiac Monitor Tracing which shows NSR.    Social Determinants of Health Risk patient is a resident in memory care.   Medical Decision Making: Summary:  Patient presents to the emergency department for evaluation of pain after a fall sometime last night.  The fall was unwitnessed.  Patient was able to get back in bed.  Initial trauma survey is unremarkable.  No outward sign of head injury or trauma.  Plan for screening blood work, trauma imaging given anticoagulated status, reassess after initial labs.   Reevaluation with update and discussion with patient.  Plan for discharge home on antibiotics with close follow-up plan w/ PCP.   Considered admission but patient is at her mental status baseline according to family at bedside.  Stable for discharge  with p.o. antibiotics.  Doubt sepsis.  Patient's presentation is most consistent with acute presentation with potential threat to life or bodily function.   Disposition: discharge  ____________________________________________  FINAL CLINICAL IMPRESSION(S) / ED DIAGNOSES  Final diagnoses:  Fall, initial encounter  Injury of head, initial encounter  Strain of neck muscle, initial encounter  Acute cystitis without hematuria     NEW OUTPATIENT MEDICATIONS STARTED DURING THIS VISIT:  Discharge Medication List as of 07/11/2024  2:34 PM     START taking these medications   Details  cephALEXin  (KEFLEX ) 500 MG capsule Take 1 capsule (500 mg total) by mouth 2 (two) times daily for 7 days., Starting Tue 07/11/2024, Until Tue 07/18/2024, Print        Note:  This document was prepared using Dragon voice recognition software and may include unintentional dictation errors.  Chew Darra, MD, [facility]    Keivon Garden, Chew MATSU, MD 07/13/24 [phone]

## 2024-07-11 NOTE — ED Notes (Signed)
 Trauma Response Nurse Documentation  Kelly Dougherty is a 83 y.o. female arriving to [facility] ED via EMS  On Eliquis  (apixaban ) daily. Trauma was activated as a Level 2 based on the following trauma criteria Elderly patients > 65 with head trauma on anti-coagulation (excluding ASA).  Patient cleared for CT by Dr. Darra. Pt transported to CT with trauma response nurse present to monitor. RN remained with the patient throughout their absence from the department for clinical observation. GCS 15, baseline dementia.  History   Past Medical History:  Diagnosis Date   Arthritis    Atrial fibrillation (HCC)    B12 deficiency    Cataract    Dementia (HCC)    Depression    Dysrhythmia    04/2015 with atrial fibrillation   Glaucoma    History of echocardiogram    Echo 1/19: EF 60-65, no RWMA, normal diastolic function, mild to mod AI, MAC, mild to mod MR, mild BAE, mod TR, PASP 40     Past Surgical History:  Procedure Laterality Date   A-FLUTTER ABLATION N/A 10/02/2019   Procedure: A-FLUTTER ABLATION;  Surgeon: Waddell Danelle ORN, MD;  Location: [facility];  Service: Cardiovascular;  Laterality: N/A;   APPENDECTOMY     83 years of age   CARDIOVERSION N/A 10/03/2015   Procedure: CARDIOVERSION;  Surgeon: Pearla DELENA Rout, MD;  Location: [facility];  Service: Cardiovascular;  Laterality: N/A;   COLONOSCOPY N/A 07/30/2016   Procedure: COLONOSCOPY;  Surgeon: Claudis RAYMOND Rivet, MD;  Location: [facility];  Service: Endoscopy;  Laterality: N/A;  8:30   EYE SURGERY     left   HEMORROIDECTOMY     TONSILLECTOMY     childhood     Initial Focused Assessment (If applicable, or please see trauma documentation): Patient A&Ox4, GCS 15, PERR 3, baseline dementia Airway intact, bilateral breath sounds Pulses 2+  CT's Completed:   CT Head/Cpsine  Interventions:  IV, labs CXR/PXR CT Head/Cspine  Plan for disposition:  Discharge home   Event Summary: Patient to ED after an unwitnessed fall  last night. Per patient she did hit her head, takes Eliquis . Complaints of lower back/hip pain. Imaging was ordered and revealed no traumatic injury. Patient able to discharge home with daughter and husband who are at bedside.  Bedside handoff with ED RN Kelly Dougherty.    Kelly Dougherty  Trauma Response RN  Please call TRN at [phone] for further assistance.

## 2024-07-11 NOTE — ED Notes (Signed)
 Patient transported to CT

## 2024-07-13 LAB — URINE CULTURE: Culture: >=100000 — AB

## 2024-07-14 ENCOUNTER — Telehealth (HOSPITAL_BASED_OUTPATIENT_CLINIC_OR_DEPARTMENT_OTHER): Payer: Self-pay

## 2024-07-14 NOTE — Telephone Encounter (Signed)
 Post ED Visit - Positive Culture Follow-up  Culture report reviewed by antimicrobial stewardship pharmacist: [facility] [x]  Leonor Bash, [place].D. []  Venetia Gully, Pharm.D., BCPS AQ-ID []  Garrel Crews, Pharm.D., BCPS []  Almarie Lunger, Pharm.D., BCPS []  [place], [address].D., BCPS, AAHIVP []  Rosaline Bihari, Pharm.D., BCPS, AAHIVP []  Vernell Meier, PharmD, BCPS []  Latanya Hint, PharmD, BCPS []  Donald Medley, PharmD, BCPS []  Rocky Bold, PharmD []  Dorothyann Alert, PharmD, BCPS []  Morene Babe, PharmD  [facility] []  Rosaline Edison, PharmD []  Romona Bliss, PharmD []  Dolphus Roller, PharmD []  Veva Seip, Rph []  Vernell Daunt) Leonce, PharmD []  Eva Allis, PharmD []  Rosaline Millet, PharmD []  Iantha Batch, PharmD []  Arvin Gauss, PharmD []  Wanda Hasting, PharmD []  Ronal Rav, PharmD []  Rocky Slade, PharmD []  Bard Jeans, PharmD   Positive urine culture Treated with Cephalexin , organism sensitive to the same and no further patient follow-up is required at this time.  Kelly Dougherty 07/14/2024, 12:35 PM

## 2024-07-28 DIAGNOSIS — I4891 Unspecified atrial fibrillation: Secondary | ICD-10-CM | POA: Diagnosis not present

## 2024-07-28 DIAGNOSIS — W19XXXA Unspecified fall, initial encounter: Secondary | ICD-10-CM | POA: Diagnosis not present

## 2024-07-28 DIAGNOSIS — S32020D Wedge compression fracture of second lumbar vertebra, subsequent encounter for fracture with routine healing: Secondary | ICD-10-CM | POA: Diagnosis not present

## 2024-07-28 DIAGNOSIS — Z7901 Long term (current) use of anticoagulants: Secondary | ICD-10-CM | POA: Diagnosis not present

## 2024-07-28 DIAGNOSIS — S32010D Wedge compression fracture of first lumbar vertebra, subsequent encounter for fracture with routine healing: Secondary | ICD-10-CM | POA: Diagnosis not present

## 2024-08-07 DIAGNOSIS — H401131 Primary open-angle glaucoma, bilateral, mild stage: Secondary | ICD-10-CM | POA: Diagnosis not present

## 2024-08-07 DIAGNOSIS — I4891 Unspecified atrial fibrillation: Secondary | ICD-10-CM | POA: Diagnosis not present

## 2024-08-07 DIAGNOSIS — G20C Parkinsonism, unspecified: Secondary | ICD-10-CM | POA: Diagnosis not present

## 2024-08-07 DIAGNOSIS — F0280 Dementia in other diseases classified elsewhere without behavioral disturbance: Secondary | ICD-10-CM | POA: Diagnosis not present

## 2024-08-07 DIAGNOSIS — M1991 Primary osteoarthritis, unspecified site: Secondary | ICD-10-CM | POA: Diagnosis not present

## 2024-08-07 DIAGNOSIS — Z7901 Long term (current) use of anticoagulants: Secondary | ICD-10-CM | POA: Diagnosis not present

## 2024-08-07 DIAGNOSIS — F331 Major depressive disorder, recurrent, moderate: Secondary | ICD-10-CM | POA: Diagnosis not present

## 2024-08-07 DIAGNOSIS — G3183 Dementia with Lewy bodies: Secondary | ICD-10-CM | POA: Diagnosis not present

## 2024-08-15 ENCOUNTER — Other Ambulatory Visit: Payer: Self-pay | Admitting: Family Medicine

## 2024-08-15 DIAGNOSIS — I482 Chronic atrial fibrillation, unspecified: Secondary | ICD-10-CM

## 2024-08-16 ENCOUNTER — Ambulatory Visit (INDEPENDENT_AMBULATORY_CARE_PROVIDER_SITE_OTHER)

## 2024-08-16 VITALS — Ht 66.0 in | Wt 120.0 lb

## 2024-08-16 DIAGNOSIS — Z0000 Encounter for general adult medical examination without abnormal findings: Secondary | ICD-10-CM

## 2024-08-16 NOTE — Patient Instructions (Signed)
 Kelly Dougherty , Thank you for taking time out of your busy schedule to complete your Annual Wellness Visit with me. I enjoyed our conversation and look forward to speaking with you again next year. I, as well as your care team,  appreciate your ongoing commitment to your health goals. Please review the following plan we discussed and let me know if I can assist you in the future. Your Game plan/ To Do List     Follow up Visits: We will see or speak with you next year for your Next Medicare AWV with our clinical staff Have you seen your provider in the last 6 months (3 months if uncontrolled diabetes)? Yes  Clinician Recommendations:  Aim for 30 minutes of exercise or brisk walking, 6-8 glasses of water , and 5 servings of fruits and vegetables each day.       This is a list of the screenings recommended for you:  Health Maintenance  Topic Date Due   Zoster (Shingles) Vaccine (1 of 2) Never done   COVID-19 Vaccine (3 - Moderna risk series) 03/08/2020   Flu Shot  07/28/2024   Medicare Annual Wellness Visit  08/16/2025   Pneumococcal Vaccine for age over 50  Completed   DEXA scan (bone density measurement)  Completed   HPV Vaccine  Aged Out   Meningitis B Vaccine  Aged Out   DTaP/Tdap/Td vaccine  Discontinued    Advanced directives: (ACP Link)Information on Advanced Care Planning can be found at [employer] [facility] Advance Health Care Directives Advance Health Care Directives. [email or web address]   Advance Care Planning is important because it:  [x]  Makes sure you receive the medical care that is consistent with your values, goals, and preferences  [x]  It provides guidance to your family and loved ones and reduces their decisional burden about whether or not they are making the right decisions based on your wishes.  Follow the link provided in your after visit summary or read over the paperwork we have mailed to you to help you started getting your Advance Directives in place. If you  need assistance in completing these, please reach out to us  so that we can help you!  See attachments for Preventive Care and Fall Prevention Tips.

## 2024-08-16 NOTE — Progress Notes (Signed)
 Subjective:   Kelly Dougherty is a 83 y.o. who presents for a Medicare Wellness preventive visit.  As a reminder, Annual Wellness Visits don't include a physical exam, and some assessments may be limited, especially if this visit is performed virtually. We may recommend an in-person follow-up visit with your provider if needed.  Visit Complete: Virtual I connected with  Kelly Dougherty on 08/16/24 by a audio enabled telemedicine application and verified that I am speaking with the correct person using two identifiers.  Patient Location: Other:  Assisted Living Facility  Provider Location: Home Office  I discussed the limitations of evaluation and management by telemedicine. The patient expressed understanding and agreed to proceed.  Vital Signs: Because this visit was a virtual/telehealth visit, some criteria may be missing or patient reported. Any vitals not documented were not able to be obtained and vitals that have been documented are patient reported.  VideoDeclined- This patient declined Librarian, academic. Therefore the visit was completed with audio only.  Persons Participating in Visit: Patient assisted by daughter Kelly Dougherty .  AWV Questionnaire: No: Patient Medicare AWV questionnaire was not completed prior to this visit.  Cardiac Risk Factors include: advanced age (>21men, >50 women)     Objective:    Today's Vitals   08/16/24 1105  Weight: 120 lb (54.4 kg)  Height: 5' 6 (1.676 m)   Body mass index is 19.37 kg/m.     08/16/2024   11:27 AM 07/11/2024   10:41 AM 06/20/2024    3:47 PM 11/19/2023    6:48 AM 05/06/2023   10:30 AM 01/02/2021    6:48 PM 12/06/2020   11:56 AM  Advanced Directives  Does Patient Have a Medical Advance Directive? No No No Yes Yes No No  Type of Advance Directive     Healthcare Power of Attorney    Does patient want to make changes to medical advance directive?    No - Patient declined     Would patient like  information on creating a medical advance directive? Yes (MAU/Ambulatory/Procedural Areas - Information given)  No - Patient declined   No - Patient declined     Current Medications (verified) Outpatient Encounter Medications as of 08/16/2024  Medication Sig   apixaban  (ELIQUIS ) 2.5 MG TABS tablet Take 1 tablet by mouth twice daily   calcitonin, salmon, (MIACALCIN/FORTICAL) 200 UNIT/ACT nasal spray Place 1 spray into alternate nostrils daily.   calcium-vitamin D (OSCAL WITH D) 500-200 MG-UNIT tablet Take 1 tablet by mouth 2 (two) times daily.    digoxin  (LANOXIN ) 0.125 MG tablet TAKE 1 TABLET BY MOUTH ONCE DAILY MONDAY  THRU  SATURDAY.  NONE  ON  SUNDAY   dorzolamide (TRUSOPT) 2 % ophthalmic solution Place 1 drop into both eyes every evening.   escitalopram  (LEXAPRO ) 10 MG tablet Take 1 tablet (10 mg total) by mouth daily.   metoprolol  succinate (TOPROL -XL) 50 MG 24 hr tablet Take 1 tablet (50 mg total) by mouth daily. Take with or immediately following a meal. Please keep scheduled appointment for future refills. Thank you.   Multiple Vitamins-Minerals (PRESERVISION AREDS PO) Take 1 capsule by mouth in the morning and at bedtime.   Netarsudil Dimesylate 0.02 % SOLN Place 1 drop into the left eye every evening. Rhopressa   timolol (TIMOPTIC) 0.5 % ophthalmic solution Place 1 drop into both eyes every evening.   No facility-administered encounter medications on file as of 08/16/2024.    Allergies (verified) Patient has no known  allergies.   History: Past Medical History:  Diagnosis Date   Arthritis    Atrial fibrillation (HCC)    B12 deficiency    Cataract    Dementia (HCC)    Depression    Dysrhythmia    04/2015 with atrial fibrillation   Glaucoma    History of echocardiogram    Echo 1/19: EF 60-65, no RWMA, normal diastolic function, mild to mod AI, MAC, mild to mod MR, mild BAE, mod TR, PASP 40   Hyperlipidemia 12/2016   Past Surgical History:  Procedure Laterality Date    A-FLUTTER ABLATION N/A 10/02/2019   Procedure: A-FLUTTER ABLATION;  Surgeon: Waddell Danelle ORN, MD;  Location: [facility];  Service: Cardiovascular;  Laterality: N/A;   APPENDECTOMY     83 years of age   CARDIOVERSION N/A 10/03/2015   Procedure: CARDIOVERSION;  Surgeon: Pearla DELENA Rout, MD;  Location: [facility] ORS;  Service: Cardiovascular;  Laterality: N/A;   COLONOSCOPY N/A 07/30/2016   Procedure: COLONOSCOPY;  Surgeon: Claudis RAYMOND Rivet, MD;  Location: [facility] ENDO SUITE;  Service: Endoscopy;  Laterality: N/A;  8:30   EYE SURGERY     left   HEMORROIDECTOMY     TONSILLECTOMY     childhood   Family History  Problem Relation Age of Onset   Hearing loss Mother    Alzheimer's disease Father    Social History   Socioeconomic History   Marital status: Married    Spouse name: Not on file   Number of children: Not on file   Years of education: Not on file   Highest education level: Some college, no degree  Occupational History   Not on file  Tobacco Use   Smoking status: Never   Smokeless tobacco: Never  Vaping Use   Vaping status: Never Used  Substance and Sexual Activity   Alcohol use: Not Currently    Alcohol/week: 7.0 standard drinks of alcohol    Comment: one glass of wine with dinner each night   Drug use: No   Sexual activity: Not Currently    Birth control/protection: None  Other Topics Concern   Not on file  Social History Narrative   Resides at [facility]    [address] [place] [address]   Social Drivers of Health   Financial Resource Strain: Low Risk  (08/16/2024)   Overall Financial Resource Strain (CARDIA)    Difficulty of Paying Living Expenses: Not hard at all  Food Insecurity: No Food Insecurity (08/16/2024)   Hunger Vital Sign    Worried About Running Out of Food in the Last Year: Never true    Ran Out of Food in the Last Year: Never true  Transportation Needs: No Transportation Needs (08/16/2024)   PRAPARE - Therapist, art (Medical): No    Lack of Transportation (Non-Medical): No  Physical Activity: Inactive (08/16/2024)   Exercise Vital Sign    Days of Exercise per Week: 0 days    Minutes of Exercise per Session: 0 min  Stress: Stress Concern Present (08/16/2024)   [employer] of Occupational Health - Occupational Stress Questionnaire    Feeling of Stress: To some extent  Social Connections: Moderately Isolated (08/16/2024)   Social Connection and Isolation Panel    Frequency of Communication with Friends and Family: More than three times a week    Frequency of Social Gatherings with Friends and Family: More than three times a week  Attends Religious Services: Patient declined    Active Member of Clubs or Organizations: No    Attends Banker Meetings: Never    Marital Status: Married    Tobacco Counseling Counseling given: Not Answered    Clinical Intake:  Pre-visit preparation completed: Yes  Pain : No/denies pain  Diabetes: No   How often do you need to have someone help you when you read instructions, pamphlets, or other written materials from your doctor or pharmacy?: 1 - Never  Interpreter Needed?: No  Information entered by :: Charmaine Bloodgood LPN   Activities of Daily Living     08/16/2024   11:27 AM  In your present state of health, do you have any difficulty performing the following activities:  Hearing? 0  Vision? 0  Difficulty concentrating or making decisions? 1  Walking or climbing stairs? 1  Dressing or bathing? 0  Doing errands, shopping? 1  Preparing Food and eating ? N  Using the Toilet? N  In the past six months, have you accidently leaked urine? N  Do you have problems with loss of bowel control? N  Managing your Medications? Y  Managing your Finances? Y  Housekeeping or managing your Housekeeping? Y    Patient Care Team: Duanne Butler DASEN, MD as PCP - General (Family Medicine) Waddell Danelle ORN, MD as PCP -  Electrophysiology (Cardiology) Louis Shove, MD as Consulting Physician (Neurosurgery) Josh Server, MD as Referring Physician (Ophthalmology) Leslee Reusing, MD as Consulting Physician (Ophthalmology)  I have updated your Care Teams any recent Medical Services you may have received from other providers in the past year.     Assessment:   This is a routine wellness examination for [place].  Hearing/Vision screen Hearing Screening - Comments:: Denies hearing difficulties   Vision Screening - Comments:: Wears rx glasses - up to date with routine eye exams with Dr. Leslee also sees Dr. Josh    Goals Addressed             This Visit's Progress    Prevent falls         Depression Screen     08/16/2024   11:26 AM 06/06/2024    9:38 AM 05/12/2023    9:11 AM 05/06/2023   10:51 AM 04/19/2023   10:07 AM 02/27/2020    2:10 PM 12/26/2019    9:39 AM  PHQ 2/9 Scores  PHQ - 2 Score 0 0 0 4 1 0 0  PHQ- 9 Score  1 0 4 1      Fall Risk     08/16/2024   10:55 AM 05/12/2023    9:10 AM 04/19/2023   10:07 AM 02/27/2020    2:10 PM 12/26/2019    9:39 AM  Fall Risk   Falls in the past year? 1 0 0 0  0   Number falls in past yr: 0 0 0  0   Injury with Fall? 1 0 0  0  Risk for fall due to : History of fall(s);Impaired balance/gait;Impaired mobility Impaired balance/gait  No Fall Risks   Follow up Education provided;Falls prevention discussed;Falls evaluation completed Falls prevention discussed  Falls evaluation completed       Data saved with a previous flowsheet row definition    MEDICARE RISK AT HOME:  Medicare Risk at Home Any stairs in or around the home?: No If so, are there any without handrails?: No Home free of loose throw rugs in walkways, pet beds, electrical cords, etc?: Yes Adequate  lighting in your home to reduce risk of falls?: Yes Life alert?: No Use of a cane, walker or w/c?: Yes Grab bars in the bathroom?: Yes Shower chair or bench in shower?: Yes Elevated toilet  seat or a handicapped toilet?: Yes  TIMED UP AND GO:  Was the test performed?  No  Cognitive Function: Impaired: Patient has current diagnosis of cognitive impairment.    04/19/2023    9:52 AM  MMSE - Mini Mental State Exam  Orientation to time 4  Orientation to time comments close to lunch time  Orientation to Place 5  Registration 2  Attention/ Calculation 4  Recall 2  Recall-comments 1 wrong  Language- name 2 objects 2  Language- repeat 1  Language- follow 3 step command 3  Language- read & follow direction 1  Write a sentence 1  Copy design 1  Total score 26        05/06/2023   10:40 AM  6CIT Screen  What Year? 0 points  What month? 3 points  What time? 0 points  Count back from 20 2 points  Months in reverse 4 points  Repeat phrase 8 points  Total Score 17 points    Immunizations Immunization History  Administered Date(s) Administered   Influenza, High Dose Seasonal PF 09/29/2017, 10/09/2018, 09/21/2023   Influenza,inj,Quad PF,6+ Mos 10/02/2015, 09/21/2019, 10/25/2019   Influenza-Unspecified 09/19/2016, 09/21/2019   Moderna Sars-Covid-2 Vaccination 01/09/2020, 02/09/2020   Pneumococcal Conjugate-13 12/10/2015   Pneumococcal Polysaccharide-23 12/28/2012   Tdap 12/28/2012    Screening Tests Health Maintenance  Topic Date Due   Zoster Vaccines- Shingrix (1 of 2) Never done   COVID-19 Vaccine (3 - Moderna risk series) 03/08/2020   INFLUENZA VACCINE  07/28/2024   Medicare Annual Wellness (AWV)  08/16/2025   Pneumococcal Vaccine: 50+ Years  Completed   DEXA SCAN  Completed   HPV VACCINES  Aged Out   Meningococcal B Vaccine  Aged Out   DTaP/Tdap/Td  Discontinued    Health Maintenance  Health Maintenance Due  Topic Date Due   Zoster Vaccines- Shingrix (1 of 2) Never done   COVID-19 Vaccine (3 - Moderna risk series) 03/08/2020   INFLUENZA VACCINE  07/28/2024    Additional Screening:  Vision Screening: Recommended annual ophthalmology exams for  early detection of glaucoma and other disorders of the eye. Would you like a referral to an eye doctor? No    Dental Screening: Recommended annual dental exams for proper oral hygiene  Community Resource Referral / Chronic Care Management: CRR required this visit?  No   CCM required this visit?  No   Plan:    I have personally reviewed and noted the following in the patient's chart:   Medical and social history Use of alcohol, tobacco or illicit drugs  Current medications and supplements including opioid prescriptions. Patient is not currently taking opioid prescriptions. Functional ability and status Nutritional status Physical activity Advanced directives List of other physicians Hospitalizations, surgeries, and ER visits in previous 12 months Vitals Screenings to include cognitive, depression, and falls Referrals and appointments  In addition, I have reviewed and discussed with patient certain preventive protocols, quality metrics, and best practice recommendations. A written personalized care plan for preventive services as well as general preventive health recommendations were provided to patient.   Lavelle Pfeiffer [place], [place]   1/79/7974   After Visit Summary: (MyChart) Due to this being a telephonic visit, the after visit summary with patients personalized plan was offered to patient via MyChart  Notes: See telephone note

## 2024-08-16 NOTE — Telephone Encounter (Signed)
 Requested medication (s) are due for refill today: historical medications, Vit B12 1000 mcg po not on medication list  Requested medication (s) are on the active medication list: yes - eliquis , trusopt 2 % drops, preservision. No vit B12- cyanocobalamin   Last refill:  historical provider  Future visit scheduled: no   Notes to clinic:  eliquis  last ordered by Danelle Birmingham, MD, historical provider - trusopt and preservision, do you want to order Rxs and order vit B12 oral ?     Requested Prescriptions  Pending Prescriptions Disp Refills   ELIQUIS  2.5 MG TABS tablet [Pharmacy Med Name: ELIQUIS  2.5MG  TABLET] 60 tablet 11    Sig: TAKE 1 TAB BY MOUTH TWICE DAILY     Hematology:  Anticoagulants - apixaban  Passed - 08/16/2024  3:30 PM      Passed - PLT in normal range and within 360 days    Platelets  Date Value Ref Range Status  07/11/2024 270 150 - 400 K/uL Final  09/21/2019 211 150 - 450 x10E3/uL Final         Passed - HGB in normal range and within 360 days    Hemoglobin  Date Value Ref Range Status  07/11/2024 12.9 12.0 - 15.0 g/dL Final  90/75/7979 85.2 11.1 - 15.9 g/dL Final         Passed - HCT in normal range and within 360 days    HCT  Date Value Ref Range Status  07/11/2024 39.2 36.0 - 46.0 % Final   Hematocrit  Date Value Ref Range Status  09/21/2019 42.8 34.0 - 46.6 % Final         Passed - Cr in normal range and within 360 days    Creat  Date Value Ref Range Status  05/09/2024 0.71 0.60 - 0.95 mg/dL Final   Creatinine, Ser  Date Value Ref Range Status  07/11/2024 0.70 0.44 - 1.00 mg/dL Final         Passed - AST in normal range and within 360 days    AST  Date Value Ref Range Status  07/11/2024 29 15 - 41 U/L Final         Passed - ALT in normal range and within 360 days    ALT  Date Value Ref Range Status  07/11/2024 22 0 - 44 U/L Final         Passed - Valid encounter within last 12 months    Recent Outpatient Visits           1 month ago  Memory impairment of gradual onset   [place] [facility] Duanne Butler DASEN, MD   2 months ago B12 deficiency   [place] [facility] Duanne Butler DASEN, MD   3 months ago Memory impairment of gradual onset   [place] [facility] Pickard, Butler DASEN, MD   1 year ago Memory impairment of gradual onset   [place] [facility] Medicine Kayla Jeoffrey RAMAN, FNP   1 year ago Mild hyperlipidemia   [place] [facility] Medicine Kayla Jeoffrey RAMAN, FNP       Future Appointments             In 4 months Almetta Donnice LABOR, MD [facility] at [facility] A Dept of [employer] [facility], H&V             dorzolamide (TRUSOPT) 2 % ophthalmic solution [Pharmacy Med Name: DORZOLAMIDE HCL  2 % DROPS] 10 mL 11    Sig: INSTILL 1 DROP INTO EACH EYE ONCE DAILY     Ophthalmology:  Glaucoma 3 Passed - 08/16/2024  3:30 PM      Passed - Cr in normal range and within 360 days    Creat  Date Value Ref Range Status  05/09/2024 0.71 0.60 - 0.95 mg/dL Final   Creatinine, Ser  Date Value Ref Range Status  07/11/2024 0.70 0.44 - 1.00 mg/dL Final         Passed - eGFR is 30 or above and within 360 days    GFR calc Af Amer  Date Value Ref Range Status  09/21/2019 86 >59 mL/min/1.73 Final   GFR, Estimated  Date Value Ref Range Status  07/11/2024 >60 >60 mL/min Final    Comment:    (NOTE) Calculated using the CKD-EPI Creatinine Equation (2021)    eGFR  Date Value Ref Range Status  05/09/2024 83 > OR = 60 mL/min/1.91m2 Final         Passed - Valid encounter within last 12 months    Recent Outpatient Visits           1 month ago Memory impairment of gradual onset   [place] [facility] Pickard, Butler DASEN, MD   2 months ago B12 deficiency   [place] [facility] Medicine Duanne, Butler DASEN, MD   3 months ago Memory impairment of gradual onset   [facility] Medicine Pickard,  Butler DASEN, MD   1 year ago Memory impairment of gradual onset   [place] [facility] Family Medicine Kayla Jeoffrey RAMAN, FNP   1 year ago Mild hyperlipidemia   [place] [facility] Family Medicine Kayla Jeoffrey RAMAN, FNP       Future Appointments             In 4 months Almetta Donnice LABOR, MD [facility] HeartCare at [facility] A Dept of [employer], H&V             Multiple Vitamins-Minerals (PRESERVISION AREDS 2) CHEW [Pharmacy Med Name: PRESERVISION AREDS 2 TAB CHEW 250-1.25MG  TAB CHEW] 60 tablet 11    Sig: TAKE 1 TAB BY MOUTH TWICE DAILY     There is no refill protocol information for this order     cyanocobalamin  (VITAMIN B12) 1000 MCG tablet [Pharmacy Med Name: VITAMIN B-12 1000MCG TABLET] 30 tablet 11    Sig: TAKE 1 TAB BY MOUTH ONCE DAILY     Endocrinology:  Vitamins - Vitamin B12 Failed - 08/16/2024  3:30 PM      Failed - B12 Level in normal range and within 360 days    Vitamin B-12  Date Value Ref Range Status  05/09/2024 197 (L) 200 - 1,100 pg/mL Final         Passed - HCT in normal range and within 360 days    HCT  Date Value Ref Range Status  07/11/2024 39.2 36.0 - 46.0 % Final   Hematocrit  Date Value Ref Range Status  09/21/2019 42.8 34.0 - 46.6 % Final         Passed - HGB in normal range and within 360 days    Hemoglobin  Date Value Ref Range Status  07/11/2024 12.9 12.0 - 15.0 g/dL Final  90/75/7979 85.2 11.1 - 15.9 g/dL Final         Passed - Valid encounter within last 12 months  Recent Outpatient Visits           1 month ago Memory impairment of gradual onset   [place] [facility] Pickard, Butler DASEN, MD   2 months ago B12 deficiency   [place] [facility] Medicine Pickard, Butler DASEN, MD   3 months ago Memory impairment of gradual onset   [place] [facility] Family Medicine Pickard, Butler DASEN, MD   1 year ago Memory impairment of gradual onset   [place] [facility] Medicine Kayla Jeoffrey RAMAN, FNP   1 year ago Mild hyperlipidemia   [place] [facility] Family Medicine Kayla Jeoffrey RAMAN, FNP       Future Appointments             In 4 months Almetta Donnice LABOR, MD [facility] HeartCare at [facility] A Dept of [employer] [facility], H&V

## 2024-08-16 NOTE — Telephone Encounter (Signed)
 Requested medication (s) are due for refill today: -  Requested medication (s) are on the active medication list: hx med  Last refill:  01/01/21  Future visit scheduled: no  Notes to clinic:  med not assigned to a protcol   Requested Prescriptions  Pending Prescriptions Disp Refills   RHOPRESSA 0.02 % SOLN [Pharmacy Med Name: RHOPRESSA 0.02 % DROPS] 2.5 mL 11    Sig: INSTILL 1 DROP INTO LEFT EYE ONCE DAILY     Off-Protocol Failed - 08/16/2024  3:24 PM      Failed - Medication not assigned to a protocol, review manually.      Passed - Valid encounter within last 12 months    Recent Outpatient Visits           1 month ago Memory impairment of gradual onset   [place] [facility] Family Medicine Pickard, Butler DASEN, MD   2 months ago B12 deficiency   [place] [facility] Family Medicine Pickard, Butler DASEN, MD   3 months ago Memory impairment of gradual onset   [place] [facility] Family Medicine Pickard, Butler DASEN, MD   1 year ago Memory impairment of gradual onset   [place] [facility] Family Medicine Kayla Jeoffrey RAMAN, FNP   1 year ago Mild hyperlipidemia   [place] [facility] Family Medicine Kayla Jeoffrey RAMAN, FNP       Future Appointments             In 4 months Almetta Donnice LABOR, MD [facility] HeartCare at [facility] A Dept of [employer] [facility], H&V

## 2024-08-17 ENCOUNTER — Telehealth: Payer: Self-pay

## 2024-08-17 NOTE — Telephone Encounter (Signed)
 Patient seen for AWV on 08/16/24.  Daughter wants to update provider that patient is now in a memory care unit at [facility] [address] [address].  Family plans to switch care over to in house providers at facility.  Would like to know if care situation changes will patient be able to re-establish with Dr. Duanne?

## 2024-08-23 DIAGNOSIS — S32010A Wedge compression fracture of first lumbar vertebra, initial encounter for closed fracture: Secondary | ICD-10-CM | POA: Diagnosis not present

## 2024-08-25 DIAGNOSIS — F331 Major depressive disorder, recurrent, moderate: Secondary | ICD-10-CM | POA: Diagnosis not present

## 2024-08-25 DIAGNOSIS — F0280 Dementia in other diseases classified elsewhere without behavioral disturbance: Secondary | ICD-10-CM | POA: Diagnosis not present

## 2024-08-25 DIAGNOSIS — I10 Essential (primary) hypertension: Secondary | ICD-10-CM | POA: Diagnosis not present

## 2024-08-25 DIAGNOSIS — M1991 Primary osteoarthritis, unspecified site: Secondary | ICD-10-CM | POA: Diagnosis not present

## 2024-09-06 DIAGNOSIS — F0280 Dementia in other diseases classified elsewhere without behavioral disturbance: Secondary | ICD-10-CM | POA: Diagnosis not present

## 2024-09-06 DIAGNOSIS — R2681 Unsteadiness on feet: Secondary | ICD-10-CM | POA: Diagnosis not present

## 2024-09-06 DIAGNOSIS — R54 Age-related physical debility: Secondary | ICD-10-CM | POA: Diagnosis not present

## 2024-09-07 DIAGNOSIS — R2681 Unsteadiness on feet: Secondary | ICD-10-CM | POA: Diagnosis not present

## 2024-09-07 DIAGNOSIS — F0280 Dementia in other diseases classified elsewhere without behavioral disturbance: Secondary | ICD-10-CM | POA: Diagnosis not present

## 2024-09-07 DIAGNOSIS — R54 Age-related physical debility: Secondary | ICD-10-CM | POA: Diagnosis not present

## 2024-09-11 DIAGNOSIS — F02B0 Dementia in other diseases classified elsewhere, moderate, without behavioral disturbance, psychotic disturbance, mood disturbance, and anxiety: Secondary | ICD-10-CM | POA: Diagnosis not present

## 2024-09-11 DIAGNOSIS — S32010D Wedge compression fracture of first lumbar vertebra, subsequent encounter for fracture with routine healing: Secondary | ICD-10-CM | POA: Diagnosis not present

## 2024-09-11 DIAGNOSIS — R2681 Unsteadiness on feet: Secondary | ICD-10-CM | POA: Diagnosis not present

## 2024-09-11 DIAGNOSIS — R531 Weakness: Secondary | ICD-10-CM | POA: Diagnosis not present

## 2024-09-11 DIAGNOSIS — F0280 Dementia in other diseases classified elsewhere without behavioral disturbance: Secondary | ICD-10-CM | POA: Diagnosis not present

## 2024-09-11 DIAGNOSIS — R54 Age-related physical debility: Secondary | ICD-10-CM | POA: Diagnosis not present

## 2024-09-13 DIAGNOSIS — R2681 Unsteadiness on feet: Secondary | ICD-10-CM | POA: Diagnosis not present

## 2024-09-13 DIAGNOSIS — F0280 Dementia in other diseases classified elsewhere without behavioral disturbance: Secondary | ICD-10-CM | POA: Diagnosis not present

## 2024-09-13 DIAGNOSIS — R54 Age-related physical debility: Secondary | ICD-10-CM | POA: Diagnosis not present

## 2024-09-18 ENCOUNTER — Encounter (HOSPITAL_COMMUNITY): Payer: Self-pay

## 2024-09-18 ENCOUNTER — Emergency Department (HOSPITAL_COMMUNITY)
Admission: EM | Admit: 2024-09-18 | Discharge: 2024-09-18 | Disposition: A | Discharge status: Home / Self Care | Source: Skilled Nursing Facility | Attending: Emergency Medicine | Admitting: Emergency Medicine

## 2024-09-18 ENCOUNTER — Emergency Department (HOSPITAL_COMMUNITY)

## 2024-09-18 DIAGNOSIS — I10 Essential (primary) hypertension: Secondary | ICD-10-CM | POA: Diagnosis not present

## 2024-09-18 DIAGNOSIS — R54 Age-related physical debility: Secondary | ICD-10-CM | POA: Diagnosis not present

## 2024-09-18 DIAGNOSIS — W1830XA Fall on same level, unspecified, initial encounter: Secondary | ICD-10-CM | POA: Insufficient documentation

## 2024-09-18 DIAGNOSIS — F0390 Unspecified dementia without behavioral disturbance: Secondary | ICD-10-CM | POA: Insufficient documentation

## 2024-09-18 DIAGNOSIS — R2681 Unsteadiness on feet: Secondary | ICD-10-CM | POA: Diagnosis not present

## 2024-09-18 DIAGNOSIS — I4891 Unspecified atrial fibrillation: Secondary | ICD-10-CM | POA: Insufficient documentation

## 2024-09-18 DIAGNOSIS — R230 Cyanosis: Secondary | ICD-10-CM | POA: Diagnosis not present

## 2024-09-18 DIAGNOSIS — S0990XA Unspecified injury of head, initial encounter: Secondary | ICD-10-CM | POA: Diagnosis not present

## 2024-09-18 DIAGNOSIS — Z043 Encounter for examination and observation following other accident: Secondary | ICD-10-CM | POA: Diagnosis not present

## 2024-09-18 DIAGNOSIS — W19XXXA Unspecified fall, initial encounter: Secondary | ICD-10-CM | POA: Diagnosis not present

## 2024-09-18 DIAGNOSIS — R410 Disorientation, unspecified: Secondary | ICD-10-CM | POA: Insufficient documentation

## 2024-09-18 DIAGNOSIS — Z7901 Long term (current) use of anticoagulants: Secondary | ICD-10-CM | POA: Insufficient documentation

## 2024-09-18 DIAGNOSIS — R519 Headache, unspecified: Secondary | ICD-10-CM | POA: Insufficient documentation

## 2024-09-18 DIAGNOSIS — R531 Weakness: Secondary | ICD-10-CM | POA: Diagnosis not present

## 2024-09-18 DIAGNOSIS — R609 Edema, unspecified: Secondary | ICD-10-CM | POA: Diagnosis not present

## 2024-09-18 DIAGNOSIS — I517 Cardiomegaly: Secondary | ICD-10-CM | POA: Diagnosis not present

## 2024-09-18 DIAGNOSIS — F0280 Dementia in other diseases classified elsewhere without behavioral disturbance: Secondary | ICD-10-CM | POA: Diagnosis not present

## 2024-09-18 DIAGNOSIS — S199XXA Unspecified injury of neck, initial encounter: Secondary | ICD-10-CM | POA: Diagnosis not present

## 2024-09-18 DIAGNOSIS — G319 Degenerative disease of nervous system, unspecified: Secondary | ICD-10-CM | POA: Diagnosis not present

## 2024-09-18 DIAGNOSIS — I639 Cerebral infarction, unspecified: Secondary | ICD-10-CM | POA: Diagnosis not present

## 2024-09-18 LAB — I-STAT CHEM 8, ED
BUN: 19 mg/dL (ref 8–23)
Calcium, Ion: 1.16 mmol/L (ref 1.15–1.40)
Chloride: 101 mmol/L (ref 98–111)
Creatinine, Ser: 0.8 mg/dL (ref 0.44–1.00)
Glucose, Bld: 86 mg/dL (ref 70–99)
HCT: 41 % (ref 36.0–46.0)
Hemoglobin: 13.9 g/dL (ref 12.0–15.0)
Potassium: 3.7 mmol/L (ref 3.5–5.1)
Sodium: 138 mmol/L (ref 135–145)
TCO2: 24 mmol/L (ref 22–32)

## 2024-09-18 LAB — CBC WITH DIFFERENTIAL/PLATELET
Basophils Absolute: 0 K/uL (ref 0.0–0.1)
Basophils Relative: 0 %
Eosinophils Absolute: 0 K/uL (ref 0.0–0.5)
Eosinophils Relative: 0 %
HCT: 42.2 % (ref 36.0–46.0)
Hemoglobin: 13.8 g/dL (ref 12.0–15.0)
Lymphocytes Relative: 15 %
Lymphs Abs: 1.5 K/uL (ref 0.7–4.0)
MCH: 30.5 pg (ref 26.0–34.0)
MCHC: 32.7 g/dL (ref 30.0–36.0)
MCV: 93.4 fL (ref 80.0–100.0)
Monocytes Absolute: 1.1 K/uL — ABNORMAL HIGH (ref 0.1–1.0)
Monocytes Relative: 11 %
Neutro Abs: 7.3 K/uL (ref 1.7–7.7)
Neutrophils Relative %: 74 %
Platelets: 215 K/uL (ref 150–400)
RBC: 4.52 MIL/uL (ref 3.87–5.11)
RDW: 13.2 % (ref 11.5–15.5)
WBC: 9.9 K/uL (ref 4.0–10.5)
nRBC: 0 % (ref 0.0–0.2)

## 2024-09-18 LAB — COMPREHENSIVE METABOLIC PANEL WITH GFR
ALT: 23 U/L (ref 0–44)
AST: 41 U/L (ref 15–41)
Albumin: 3.2 g/dL — ABNORMAL LOW (ref 3.5–5.0)
Alkaline Phosphatase: 68 U/L (ref 38–126)
Anion gap: 11 (ref 5–15)
BUN: 19 mg/dL (ref 8–23)
CO2: 24 mmol/L (ref 22–32)
Calcium: 9.2 mg/dL (ref 8.9–10.3)
Chloride: 102 mmol/L (ref 98–111)
Creatinine, Ser: 0.86 mg/dL (ref 0.44–1.00)
GFR, Estimated: >60 mL/min (ref >60)
Glucose, Bld: 84 mg/dL (ref 70–99)
Potassium: 3.8 mmol/L (ref 3.5–5.1)
Sodium: 137 mmol/L (ref 135–145)
Total Bilirubin: 0.8 mg/dL (ref 0.0–1.2)
Total Protein: 7.3 g/dL (ref 6.5–8.1)

## 2024-09-18 LAB — PROTIME-INR
INR: 1.2 (ref 0.8–1.2)
Prothrombin Time: 15.9 s — ABNORMAL HIGH (ref 11.4–15.2)

## 2024-09-18 LAB — DIGOXIN LEVEL: Digoxin Level: <0.6 ng/mL — ABNORMAL LOW (ref 0.8–2.0)

## 2024-09-18 LAB — CK: Total CK: 128 U/L (ref 38–234)

## 2024-09-18 MED ORDER — SODIUM CHLORIDE 0.9 % IV BOLUS
500.0000 mL | Freq: Once | INTRAVENOUS | Status: AC
  Administered 2024-09-18: 500 mL via INTRAVENOUS

## 2024-09-18 NOTE — ED Provider Notes (Signed)
 Patient was initially seen by Dr. Patsey.  Please see his note.  Patient arrived from a memory care unit.  Patient's imaging test did not show any signs of acute injury.  Initial laboratory test were unremarkable.  Patient does have chronic A-fib.  Plan at shift change was to follow-up on the digoxin  level which was still pending to make sure it was not supratherapeutic.  Dig level is actually subtherapeutic.  Discussed findings with patient and family member.  They are comfortable with discharge back to the nursing facility   Randol Simmonds, MD 09/18/24 1649

## 2024-09-18 NOTE — ED Provider Notes (Signed)
 [place] EMERGENCY DEPARTMENT AT [facility] Provider Note   CSN: [id number] Arrival date & time: 09/18/24  1147     Patient presents with: Level 2 FOT   Kelly Dougherty is a 83 y.o. female.   HPI Patient presents after fall.  Reportedly had a fall sometime this weekend.  Had been complaining of head pain.  Is on Eliquis .  Comes from memory care unit.  Patient without much complaints however.   Past Medical History:  Diagnosis Date   Arthritis    Atrial fibrillation (HCC)    B12 deficiency    Cataract    Dementia (HCC)    Depression    Dysrhythmia    04/2015 with atrial fibrillation   Glaucoma    History of echocardiogram    Echo 1/19: EF 60-65, no RWMA, normal diastolic function, mild to mod AI, MAC, mild to mod MR, mild BAE, mod TR, PASP 40   Hyperlipidemia 12/2016    Prior to Admission medications   Medication Sig Start Date End Date Taking? Authorizing Provider  acetaminophen  (TYLENOL ) 500 MG tablet Take 1,000 mg by mouth 3 (three) times daily.   Yes [provider]  calcium carbonate (CALCIUM 600) 600 MG TABS tablet Take 600 mg by mouth 2 (two) times daily with a meal.   Yes [provider]  cyanocobalamin  (VITAMIN B12) 1000 MCG tablet TAKE 1 TAB BY MOUTH ONCE DAILY 08/22/24  Yes Duanne Butler DASEN, MD  digoxin  (LANOXIN ) 0.125 MG tablet TAKE 1 TABLET BY MOUTH ONCE DAILY MONDAY  THRU  SATURDAY.  NONE  ON  SUNDAY 01/24/24  Yes Waddell Danelle ORN, MD  dorzolamide (TRUSOPT) 2 % ophthalmic solution INSTILL 1 DROP INTO EACH EYE ONCE DAILY 08/22/24  Yes Duanne Butler DASEN, MD  ELIQUIS  2.5 MG TABS tablet TAKE 1 TAB BY MOUTH TWICE DAILY 08/22/24  Yes Duanne Butler DASEN, MD  escitalopram  (LEXAPRO ) 10 MG tablet Take 1 tablet (10 mg total) by mouth daily. 05/09/24  Yes Duanne Butler DASEN, MD  metoprolol  succinate (TOPROL -XL) 50 MG 24 hr tablet Take 1 tablet (50 mg total) by mouth daily. Take with or immediately following a meal. Please keep scheduled appointment  for future refills. Thank you. 12/21/23  Yes Waddell Danelle ORN, MD  Multiple Vitamins-Minerals (PRESERVISION AREDS PO) Take 1 capsule by mouth in the morning and at bedtime.   Yes [provider]  Netarsudil Dimesylate 0.02 % SOLN Place 1 drop into the left eye every evening. Rhopressa 07/31/20  Yes [provider]  calcitonin, salmon, (MIACALCIN/FORTICAL) 200 UNIT/ACT nasal spray Place 1 spray into alternate nostrils daily. Patient not taking: Reported on 09/18/2024 06/22/24   Duanne Butler DASEN, MD    Allergies: Patient has no known allergies.    Review of Systems  Updated Vital Signs BP 96/64   Pulse (!) 108   Temp 97.7 F (36.5 C) (Oral)   Resp 15   Ht 5' 5 (1.651 m)   Wt 52.2 kg   SpO2 98%   BMI 19.14 kg/m   Physical Exam Vitals and nursing note reviewed.  HENT:     Head: Normocephalic and atraumatic.  Cardiovascular:     Rate and Rhythm: Normal rate. Rhythm irregular.  Pulmonary:     Breath sounds: No wheezing or rhonchi.  Abdominal:     Tenderness: There is no abdominal tenderness.  Musculoskeletal:     Cervical back: Neck supple. No tenderness.     Right lower leg: No edema.  Left lower leg: No edema.  Skin:    General: Skin is warm.  Neurological:     Mental Status: She is alert.     Comments: Awake and answers questions.  Moving all extremities.     (all labs ordered are listed, but only abnormal results are displayed) Labs Reviewed  CBC WITH DIFFERENTIAL/PLATELET - Abnormal; Notable for the following components:      Result Value   Monocytes Absolute 1.1 (*)    All other components within normal limits  COMPREHENSIVE METABOLIC PANEL WITH GFR - Abnormal; Notable for the following components:   Albumin 3.2 (*)    All other components within normal limits  PROTIME-INR - Abnormal; Notable for the following components:   Prothrombin Time 15.9 (*)    All other components within normal limits  CK  DIGOXIN  LEVEL  I-STAT CHEM 8, ED     EKG:   Walks with a walker with assistance at baseline.EKG Interpretation Date/Time:  Monday September 18 2024 12:43:38 EDT Ventricular Rate:  110 PR Interval:    QRS Duration:  100 QT Interval:  323 QTC Calculation: 419 R Axis:   87  Text Interpretation: Atrial fibrillation Anterior infarct, age indeterminate Artifact in lead(s) I III aVR aVL V2 Confirmed by Patsey Lot [phone]) on 09/18/2024 3:01:53 PM  Radiology: CT Head Wo Contrast Result Date: 09/18/2024 CLINICAL DATA:  Head trauma, minor (Age >= 65y); Neck trauma (Age >= 65y) EXAM: CT HEAD WITHOUT CONTRAST CT CERVICAL SPINE WITHOUT CONTRAST TECHNIQUE: Multidetector CT imaging of the head and cervical spine was performed following the standard protocol without intravenous contrast. Multiplanar CT image reconstructions of the cervical spine were also generated. RADIATION DOSE REDUCTION: This exam was performed according to the departmental dose-optimization program which includes automated exposure control, adjustment of the mA and/or kV according to patient size and/or use of iterative reconstruction technique. COMPARISON:  CT head and CT cervical spine 07/11/2024 FINDINGS: CT HEAD FINDINGS Brain: No evidence of acute infarction, hemorrhage, hydrocephalus, extra-axial collection or mass lesion/mass effect. Remote bilateral cerebellar infarcts. Patchy white matter hypodensities are compatible with chronic microvascular ischemic disease. Cerebral atrophy. Vascular: No hyperdense vessel.  Calcific atherosclerosis. Skull: No acute fracture. Sinuses/Orbits: No acute findings. Other: No mastoid effusions. CT CERVICAL SPINE FINDINGS Alignment: Unchanged alignment.  No traumatic malalignment. Skull base and vertebrae: Similar mild height loss T1. No evidence of acute fracture or new height loss Soft tissues and spinal canal: No prevertebral fluid or swelling. No visible canal hematoma. Disc levels: Unchanged multilevel degenerative change, mild  for age. Upper chest: Lung apices are clear. IMPRESSION: No evidence of acute abnormality intracranially or in the cervical spine. Electronically Signed   By: Gilmore GORMAN Molt M.D.   On: 09/18/2024 13:25   CT Cervical Spine Wo Contrast Result Date: 09/18/2024 CLINICAL DATA:  Head trauma, minor (Age >= 65y); Neck trauma (Age >= 65y) EXAM: CT HEAD WITHOUT CONTRAST CT CERVICAL SPINE WITHOUT CONTRAST TECHNIQUE: Multidetector CT imaging of the head and cervical spine was performed following the standard protocol without intravenous contrast. Multiplanar CT image reconstructions of the cervical spine were also generated. RADIATION DOSE REDUCTION: This exam was performed according to the departmental dose-optimization program which includes automated exposure control, adjustment of the mA and/or kV according to patient size and/or use of iterative reconstruction technique. COMPARISON:  CT head and CT cervical spine 07/11/2024 FINDINGS: CT HEAD FINDINGS Brain: No evidence of acute infarction, hemorrhage, hydrocephalus, extra-axial collection or mass lesion/mass effect. Remote bilateral cerebellar infarcts. Patchy white  matter hypodensities are compatible with chronic microvascular ischemic disease. Cerebral atrophy. Vascular: No hyperdense vessel.  Calcific atherosclerosis. Skull: No acute fracture. Sinuses/Orbits: No acute findings. Other: No mastoid effusions. CT CERVICAL SPINE FINDINGS Alignment: Unchanged alignment.  No traumatic malalignment. Skull base and vertebrae: Similar mild height loss T1. No evidence of acute fracture or new height loss Soft tissues and spinal canal: No prevertebral fluid or swelling. No visible canal hematoma. Disc levels: Unchanged multilevel degenerative change, mild for age. Upper chest: Lung apices are clear. IMPRESSION: No evidence of acute abnormality intracranially or in the cervical spine. Electronically Signed   By: Gilmore GORMAN Molt M.D.   On: 09/18/2024 13:25   DG Pelvis  Portable Result Date: 09/18/2024 EXAM: 1 or 2 VIEW(S) XRAY OF THE PELVIS 09/18/2024 12:10:00 PM COMPARISON: 07/11/2024 CLINICAL HISTORY: Fall on thinners, head pain. FINDINGS: BONES AND JOINTS: No acute fracture. No focal osseous lesion. No joint dislocation. SOFT TISSUES: The soft tissues are unremarkable. IMPRESSION: 1. No acute findings. Electronically signed by: Selinda Blue MD 09/18/2024 01:12 PM EDT RP Workstation: [id number]   DG Chest Portable 1 View Result Date: 09/18/2024 EXAM: 1 VIEW XRAY OF THE CHEST 09/18/2024 12:10:00 PM COMPARISON: 07/11/2024 CLINICAL HISTORY: Fall on thinners, head pain. FINDINGS: LUNGS AND PLEURA: No focal pulmonary opacity. No pulmonary edema. No pleural effusion. No pneumothorax. HEART AND MEDIASTINUM: Stable mild cardiomegaly. BONES AND SOFT TISSUES: No acute osseous abnormality. IMPRESSION: 1. No acute process. 2. Stable mild cardiomegaly without pulmonary edema. Electronically signed by: Selinda Blue MD 09/18/2024 12:59 PM EDT RP Workstation: [id number]     Procedures   Medications Ordered in the ED  sodium chloride  0.9 % bolus 500 mL (has no administration in time range)                                    Medical Decision Making Amount and/or Complexity of Data Reviewed Labs: ordered. Radiology: ordered.   Patient with reported fall.  History of dementia.  Family member now here.  States that has been mildly more confused.  However not complaining of urinary symptoms.  Blood work overall reassuring.  However has had a mildly low blood pressure here. Head CT and cervical spine CT reassuring.  Chest and pelvis x-rays reassuring.  Will give fluid bolus.  Discussed with the patient's family member.  Has been weaker at home but also been going through physical therapy because of this.70 disease to get yeah   Care was turned over to oncoming provider.      Final diagnoses:  Fall, initial encounter    ED Discharge Orders     None           Patsey Lot, MD 09/18/24 1506

## 2024-09-18 NOTE — Progress Notes (Signed)
 Orthopedic Tech Progress Note Patient Details:  Kelly Dougherty 1941/05/08 [id number]  Level 2 trauma  Patient ID: [name], female   DOB: [birth year], 83 y.o.   MRN: [id number]  Delanna LITTIE Pac 09/18/2024, 11:50 AM

## 2024-09-18 NOTE — ED Notes (Signed)
 Trauma Response Nurse Documentation  Kelly Dougherty is a 83 y.o. female arriving to [facility] ED via EMS  On Eliquis  (apixaban ) daily. Trauma was activated as a Level 2 based on the following trauma criteria Elderly patients > 65 with head trauma on anti-coagulation (excluding ASA).  Patient cleared for CT by Dr. Patsey. Pt transported to CT with trauma response nurse present to monitor. RN remained with the patient throughout their absence from the department for clinical observation.   GCS 14, baseline dementia.  History   Past Medical History:  Diagnosis Date   Arthritis    Atrial fibrillation (HCC)    B12 deficiency    Cataract    Dementia (HCC)    Depression    Dysrhythmia    04/2015 with atrial fibrillation   Glaucoma    History of echocardiogram    Echo 1/19: EF 60-65, no RWMA, normal diastolic function, mild to mod AI, MAC, mild to mod MR, mild BAE, mod TR, PASP 40   Hyperlipidemia 12/2016     Past Surgical History:  Procedure Laterality Date   A-FLUTTER ABLATION N/A 10/02/2019   Procedure: A-FLUTTER ABLATION;  Surgeon: Waddell Danelle ORN, MD;  Location: [facility] INVASIVE CV LAB;  Service: Cardiovascular;  Laterality: N/A;   APPENDECTOMY     83 years of age   CARDIOVERSION N/A 10/03/2015   Procedure: CARDIOVERSION;  Surgeon: Pearla DELENA Rout, MD;  Location: [facility] ORS;  Service: Cardiovascular;  Laterality: N/A;   COLONOSCOPY N/A 07/30/2016   Procedure: COLONOSCOPY;  Surgeon: Claudis RAYMOND Rivet, MD;  Location: [facility] ENDO SUITE;  Service: Endoscopy;  Laterality: N/A;  8:30   EYE SURGERY     left   HEMORROIDECTOMY     TONSILLECTOMY     childhood     Initial Focused Assessment (If applicable, or please see trauma documentation): Patient alert, oriented to name and place but disoriented to situation and time, GCS 14, PER R brisk, L sluggish Airway intact, bilateral breath sounds Pulses 2+ C-collar placed by EMS  CT's Completed:   CT Head and CT C-Spine   Interventions:  IV,  labs CXR/PXR CT Head/Cspine  Plan for disposition:  {Trauma Dispo:26867}   Consults completed:  {Trauma Consults:26862} at ***.  Event Summary: Patient to ED after a question of fall at [facility] memory care. Per facility patient was LKW on Friday, may have had a fall over the weekend? Patient complaining of worse headache this morning, EMS was called. Patient takes Eliquis  for Afib. Imaging was ordered and revealed  Bedside handoff with ED RN [facility].    Kelly Dougherty  Trauma Response RN  Please call TRN at [phone] for further assistance.

## 2024-09-18 NOTE — Discharge Instructions (Addendum)
 The x-rays did not show any signs of serious injury.  Return to the ER for problems with fevers vomiting weakness shortness of breath or other concerning symptoms.

## 2024-09-18 NOTE — ED Triage Notes (Signed)
 Pt BIB [facility] from memory care unit at [facility] as a fall on thinners, unwitnessed fall sometime this weekend. Family reported LKW Friday, family visited pt yesterday and noted pt was more lethargic than usual and c/o head pain. Today increased head pain 6/10 and fixed left pupil per EMS

## 2024-09-20 DIAGNOSIS — F0280 Dementia in other diseases classified elsewhere without behavioral disturbance: Secondary | ICD-10-CM | POA: Diagnosis not present

## 2024-09-20 DIAGNOSIS — R54 Age-related physical debility: Secondary | ICD-10-CM | POA: Diagnosis not present

## 2024-09-20 DIAGNOSIS — R2681 Unsteadiness on feet: Secondary | ICD-10-CM | POA: Diagnosis not present

## 2024-09-22 DIAGNOSIS — I10 Essential (primary) hypertension: Secondary | ICD-10-CM | POA: Diagnosis not present

## 2024-09-22 DIAGNOSIS — W19XXXA Unspecified fall, initial encounter: Secondary | ICD-10-CM | POA: Diagnosis not present

## 2024-09-22 DIAGNOSIS — R531 Weakness: Secondary | ICD-10-CM | POA: Diagnosis not present

## 2024-09-22 DIAGNOSIS — F02B0 Dementia in other diseases classified elsewhere, moderate, without behavioral disturbance, psychotic disturbance, mood disturbance, and anxiety: Secondary | ICD-10-CM | POA: Diagnosis not present

## 2024-09-22 DIAGNOSIS — G3183 Dementia with Lewy bodies: Secondary | ICD-10-CM | POA: Diagnosis not present

## 2024-09-22 DIAGNOSIS — F0280 Dementia in other diseases classified elsewhere without behavioral disturbance: Secondary | ICD-10-CM | POA: Diagnosis not present

## 2024-09-22 DIAGNOSIS — S32010D Wedge compression fracture of first lumbar vertebra, subsequent encounter for fracture with routine healing: Secondary | ICD-10-CM | POA: Diagnosis not present

## 2024-09-25 DIAGNOSIS — R2681 Unsteadiness on feet: Secondary | ICD-10-CM | POA: Diagnosis not present

## 2024-09-25 DIAGNOSIS — F0280 Dementia in other diseases classified elsewhere without behavioral disturbance: Secondary | ICD-10-CM | POA: Diagnosis not present

## 2024-09-25 DIAGNOSIS — R54 Age-related physical debility: Secondary | ICD-10-CM | POA: Diagnosis not present

## 2024-09-26 DIAGNOSIS — M1991 Primary osteoarthritis, unspecified site: Secondary | ICD-10-CM | POA: Diagnosis not present

## 2024-09-28 DIAGNOSIS — F0280 Dementia in other diseases classified elsewhere without behavioral disturbance: Secondary | ICD-10-CM | POA: Diagnosis not present

## 2024-09-28 DIAGNOSIS — R2681 Unsteadiness on feet: Secondary | ICD-10-CM | POA: Diagnosis not present

## 2024-09-28 DIAGNOSIS — R54 Age-related physical debility: Secondary | ICD-10-CM | POA: Diagnosis not present

## 2024-09-29 DIAGNOSIS — K649 Unspecified hemorrhoids: Secondary | ICD-10-CM | POA: Diagnosis not present

## 2024-09-29 DIAGNOSIS — F0280 Dementia in other diseases classified elsewhere without behavioral disturbance: Secondary | ICD-10-CM | POA: Diagnosis not present

## 2024-09-29 DIAGNOSIS — R531 Weakness: Secondary | ICD-10-CM | POA: Diagnosis not present

## 2024-09-29 DIAGNOSIS — Z7901 Long term (current) use of anticoagulants: Secondary | ICD-10-CM | POA: Diagnosis not present

## 2024-09-29 DIAGNOSIS — R112 Nausea with vomiting, unspecified: Secondary | ICD-10-CM | POA: Diagnosis not present

## 2024-09-29 DIAGNOSIS — S32010D Wedge compression fracture of first lumbar vertebra, subsequent encounter for fracture with routine healing: Secondary | ICD-10-CM | POA: Diagnosis not present

## 2024-09-29 DIAGNOSIS — G3183 Dementia with Lewy bodies: Secondary | ICD-10-CM | POA: Diagnosis not present

## 2024-09-29 DIAGNOSIS — F02B0 Dementia in other diseases classified elsewhere, moderate, without behavioral disturbance, psychotic disturbance, mood disturbance, and anxiety: Secondary | ICD-10-CM | POA: Diagnosis not present

## 2024-09-29 DIAGNOSIS — K625 Hemorrhage of anus and rectum: Secondary | ICD-10-CM | POA: Diagnosis not present

## 2024-10-04 DIAGNOSIS — F02B0 Dementia in other diseases classified elsewhere, moderate, without behavioral disturbance, psychotic disturbance, mood disturbance, and anxiety: Secondary | ICD-10-CM | POA: Diagnosis not present

## 2024-10-04 DIAGNOSIS — F0280 Dementia in other diseases classified elsewhere without behavioral disturbance: Secondary | ICD-10-CM | POA: Diagnosis not present

## 2024-10-04 DIAGNOSIS — R54 Age-related physical debility: Secondary | ICD-10-CM | POA: Diagnosis not present

## 2024-10-04 DIAGNOSIS — S32010D Wedge compression fracture of first lumbar vertebra, subsequent encounter for fracture with routine healing: Secondary | ICD-10-CM | POA: Diagnosis not present

## 2024-10-04 DIAGNOSIS — R2681 Unsteadiness on feet: Secondary | ICD-10-CM | POA: Diagnosis not present

## 2024-10-04 DIAGNOSIS — R531 Weakness: Secondary | ICD-10-CM | POA: Diagnosis not present

## 2024-10-05 DIAGNOSIS — F02B0 Dementia in other diseases classified elsewhere, moderate, without behavioral disturbance, psychotic disturbance, mood disturbance, and anxiety: Secondary | ICD-10-CM | POA: Diagnosis not present

## 2024-10-05 DIAGNOSIS — S32010D Wedge compression fracture of first lumbar vertebra, subsequent encounter for fracture with routine healing: Secondary | ICD-10-CM | POA: Diagnosis not present

## 2024-10-05 DIAGNOSIS — F0280 Dementia in other diseases classified elsewhere without behavioral disturbance: Secondary | ICD-10-CM | POA: Diagnosis not present

## 2024-10-05 DIAGNOSIS — R531 Weakness: Secondary | ICD-10-CM | POA: Diagnosis not present

## 2024-10-05 DIAGNOSIS — R54 Age-related physical debility: Secondary | ICD-10-CM | POA: Diagnosis not present

## 2024-10-05 DIAGNOSIS — R2681 Unsteadiness on feet: Secondary | ICD-10-CM | POA: Diagnosis not present

## 2024-10-09 DIAGNOSIS — F02B0 Dementia in other diseases classified elsewhere, moderate, without behavioral disturbance, psychotic disturbance, mood disturbance, and anxiety: Secondary | ICD-10-CM | POA: Diagnosis not present

## 2024-10-09 DIAGNOSIS — S32010D Wedge compression fracture of first lumbar vertebra, subsequent encounter for fracture with routine healing: Secondary | ICD-10-CM | POA: Diagnosis not present

## 2024-10-11 DIAGNOSIS — R531 Weakness: Secondary | ICD-10-CM | POA: Diagnosis not present

## 2024-10-11 DIAGNOSIS — F02B0 Dementia in other diseases classified elsewhere, moderate, without behavioral disturbance, psychotic disturbance, mood disturbance, and anxiety: Secondary | ICD-10-CM | POA: Diagnosis not present

## 2024-10-11 DIAGNOSIS — R54 Age-related physical debility: Secondary | ICD-10-CM | POA: Diagnosis not present

## 2024-10-11 DIAGNOSIS — R2681 Unsteadiness on feet: Secondary | ICD-10-CM | POA: Diagnosis not present

## 2024-10-11 DIAGNOSIS — S32010D Wedge compression fracture of first lumbar vertebra, subsequent encounter for fracture with routine healing: Secondary | ICD-10-CM | POA: Diagnosis not present

## 2024-10-11 DIAGNOSIS — F0280 Dementia in other diseases classified elsewhere without behavioral disturbance: Secondary | ICD-10-CM | POA: Diagnosis not present

## 2024-10-12 DIAGNOSIS — F02B0 Dementia in other diseases classified elsewhere, moderate, without behavioral disturbance, psychotic disturbance, mood disturbance, and anxiety: Secondary | ICD-10-CM | POA: Diagnosis not present

## 2024-10-12 DIAGNOSIS — S32010D Wedge compression fracture of first lumbar vertebra, subsequent encounter for fracture with routine healing: Secondary | ICD-10-CM | POA: Diagnosis not present

## 2024-10-12 DIAGNOSIS — R531 Weakness: Secondary | ICD-10-CM | POA: Diagnosis not present

## 2024-10-13 DIAGNOSIS — R54 Age-related physical debility: Secondary | ICD-10-CM | POA: Diagnosis not present

## 2024-10-13 DIAGNOSIS — R2681 Unsteadiness on feet: Secondary | ICD-10-CM | POA: Diagnosis not present

## 2024-10-13 DIAGNOSIS — F0280 Dementia in other diseases classified elsewhere without behavioral disturbance: Secondary | ICD-10-CM | POA: Diagnosis not present

## 2024-10-16 DIAGNOSIS — F02B0 Dementia in other diseases classified elsewhere, moderate, without behavioral disturbance, psychotic disturbance, mood disturbance, and anxiety: Secondary | ICD-10-CM | POA: Diagnosis not present

## 2024-10-16 DIAGNOSIS — R531 Weakness: Secondary | ICD-10-CM | POA: Diagnosis not present

## 2024-10-16 DIAGNOSIS — S32010D Wedge compression fracture of first lumbar vertebra, subsequent encounter for fracture with routine healing: Secondary | ICD-10-CM | POA: Diagnosis not present

## 2024-10-17 DIAGNOSIS — S32010D Wedge compression fracture of first lumbar vertebra, subsequent encounter for fracture with routine healing: Secondary | ICD-10-CM | POA: Diagnosis not present

## 2024-10-17 DIAGNOSIS — R531 Weakness: Secondary | ICD-10-CM | POA: Diagnosis not present

## 2024-10-17 DIAGNOSIS — F02B0 Dementia in other diseases classified elsewhere, moderate, without behavioral disturbance, psychotic disturbance, mood disturbance, and anxiety: Secondary | ICD-10-CM | POA: Diagnosis not present

## 2024-10-18 DIAGNOSIS — F02B0 Dementia in other diseases classified elsewhere, moderate, without behavioral disturbance, psychotic disturbance, mood disturbance, and anxiety: Secondary | ICD-10-CM | POA: Diagnosis not present

## 2024-10-18 DIAGNOSIS — F0280 Dementia in other diseases classified elsewhere without behavioral disturbance: Secondary | ICD-10-CM | POA: Diagnosis not present

## 2024-10-18 DIAGNOSIS — R54 Age-related physical debility: Secondary | ICD-10-CM | POA: Diagnosis not present

## 2024-10-18 DIAGNOSIS — S32010D Wedge compression fracture of first lumbar vertebra, subsequent encounter for fracture with routine healing: Secondary | ICD-10-CM | POA: Diagnosis not present

## 2024-10-18 DIAGNOSIS — R531 Weakness: Secondary | ICD-10-CM | POA: Diagnosis not present

## 2024-10-19 DIAGNOSIS — R2681 Unsteadiness on feet: Secondary | ICD-10-CM | POA: Diagnosis not present

## 2024-10-19 DIAGNOSIS — F0280 Dementia in other diseases classified elsewhere without behavioral disturbance: Secondary | ICD-10-CM | POA: Diagnosis not present

## 2024-10-19 DIAGNOSIS — R54 Age-related physical debility: Secondary | ICD-10-CM | POA: Diagnosis not present

## 2024-10-23 DIAGNOSIS — F0280 Dementia in other diseases classified elsewhere without behavioral disturbance: Secondary | ICD-10-CM | POA: Diagnosis not present

## 2024-10-23 DIAGNOSIS — R2681 Unsteadiness on feet: Secondary | ICD-10-CM | POA: Diagnosis not present

## 2024-10-23 DIAGNOSIS — R54 Age-related physical debility: Secondary | ICD-10-CM | POA: Diagnosis not present

## 2024-10-25 DIAGNOSIS — F02B0 Dementia in other diseases classified elsewhere, moderate, without behavioral disturbance, psychotic disturbance, mood disturbance, and anxiety: Secondary | ICD-10-CM | POA: Diagnosis not present

## 2024-10-25 DIAGNOSIS — S32010D Wedge compression fracture of first lumbar vertebra, subsequent encounter for fracture with routine healing: Secondary | ICD-10-CM | POA: Diagnosis not present

## 2024-10-25 DIAGNOSIS — R531 Weakness: Secondary | ICD-10-CM | POA: Diagnosis not present

## 2024-10-26 DIAGNOSIS — R54 Age-related physical debility: Secondary | ICD-10-CM | POA: Diagnosis not present

## 2024-10-26 DIAGNOSIS — R2681 Unsteadiness on feet: Secondary | ICD-10-CM | POA: Diagnosis not present

## 2024-10-26 DIAGNOSIS — I10 Essential (primary) hypertension: Secondary | ICD-10-CM | POA: Diagnosis not present

## 2024-10-26 DIAGNOSIS — F329 Major depressive disorder, single episode, unspecified: Secondary | ICD-10-CM | POA: Diagnosis not present

## 2024-10-26 DIAGNOSIS — F0280 Dementia in other diseases classified elsewhere without behavioral disturbance: Secondary | ICD-10-CM | POA: Diagnosis not present

## 2024-10-27 DIAGNOSIS — S32010D Wedge compression fracture of first lumbar vertebra, subsequent encounter for fracture with routine healing: Secondary | ICD-10-CM | POA: Diagnosis not present

## 2024-10-27 DIAGNOSIS — R531 Weakness: Secondary | ICD-10-CM | POA: Diagnosis not present

## 2024-10-27 DIAGNOSIS — F331 Major depressive disorder, recurrent, moderate: Secondary | ICD-10-CM | POA: Diagnosis not present

## 2024-10-27 DIAGNOSIS — F0280 Dementia in other diseases classified elsewhere without behavioral disturbance: Secondary | ICD-10-CM | POA: Diagnosis not present

## 2024-10-27 DIAGNOSIS — G3183 Dementia with Lewy bodies: Secondary | ICD-10-CM | POA: Diagnosis not present

## 2024-10-27 DIAGNOSIS — I10 Essential (primary) hypertension: Secondary | ICD-10-CM | POA: Diagnosis not present

## 2024-10-27 DIAGNOSIS — F02B0 Dementia in other diseases classified elsewhere, moderate, without behavioral disturbance, psychotic disturbance, mood disturbance, and anxiety: Secondary | ICD-10-CM | POA: Diagnosis not present

## 2024-10-30 ENCOUNTER — Encounter: Payer: Self-pay | Admitting: Radiology

## 2024-10-30 DIAGNOSIS — S32010D Wedge compression fracture of first lumbar vertebra, subsequent encounter for fracture with routine healing: Secondary | ICD-10-CM | POA: Diagnosis not present

## 2024-10-30 DIAGNOSIS — R531 Weakness: Secondary | ICD-10-CM | POA: Diagnosis not present

## 2024-10-30 DIAGNOSIS — K5901 Slow transit constipation: Secondary | ICD-10-CM | POA: Diagnosis not present

## 2024-10-30 DIAGNOSIS — M859 Disorder of bone density and structure, unspecified: Secondary | ICD-10-CM | POA: Diagnosis not present

## 2024-10-30 DIAGNOSIS — G20C Parkinsonism, unspecified: Secondary | ICD-10-CM | POA: Diagnosis not present

## 2024-10-30 DIAGNOSIS — G301 Alzheimer's disease with late onset: Secondary | ICD-10-CM | POA: Diagnosis not present

## 2024-10-30 DIAGNOSIS — F02B0 Dementia in other diseases classified elsewhere, moderate, without behavioral disturbance, psychotic disturbance, mood disturbance, and anxiety: Secondary | ICD-10-CM | POA: Diagnosis not present

## 2024-10-30 DIAGNOSIS — R54 Age-related physical debility: Secondary | ICD-10-CM | POA: Diagnosis not present

## 2024-10-30 DIAGNOSIS — F0280 Dementia in other diseases classified elsewhere without behavioral disturbance: Secondary | ICD-10-CM | POA: Diagnosis not present

## 2024-10-30 DIAGNOSIS — R2681 Unsteadiness on feet: Secondary | ICD-10-CM | POA: Diagnosis not present

## 2024-10-31 DIAGNOSIS — S32010D Wedge compression fracture of first lumbar vertebra, subsequent encounter for fracture with routine healing: Secondary | ICD-10-CM | POA: Diagnosis not present

## 2024-10-31 DIAGNOSIS — R531 Weakness: Secondary | ICD-10-CM | POA: Diagnosis not present

## 2024-10-31 DIAGNOSIS — F02B0 Dementia in other diseases classified elsewhere, moderate, without behavioral disturbance, psychotic disturbance, mood disturbance, and anxiety: Secondary | ICD-10-CM | POA: Diagnosis not present

## 2024-11-01 DIAGNOSIS — F02B0 Dementia in other diseases classified elsewhere, moderate, without behavioral disturbance, psychotic disturbance, mood disturbance, and anxiety: Secondary | ICD-10-CM | POA: Diagnosis not present

## 2024-11-01 DIAGNOSIS — S32010D Wedge compression fracture of first lumbar vertebra, subsequent encounter for fracture with routine healing: Secondary | ICD-10-CM | POA: Diagnosis not present

## 2024-11-01 DIAGNOSIS — R54 Age-related physical debility: Secondary | ICD-10-CM | POA: Diagnosis not present

## 2024-11-01 DIAGNOSIS — R2681 Unsteadiness on feet: Secondary | ICD-10-CM | POA: Diagnosis not present

## 2024-11-01 DIAGNOSIS — F0280 Dementia in other diseases classified elsewhere without behavioral disturbance: Secondary | ICD-10-CM | POA: Diagnosis not present

## 2024-11-03 DIAGNOSIS — I10 Essential (primary) hypertension: Secondary | ICD-10-CM | POA: Diagnosis not present

## 2024-11-03 DIAGNOSIS — K5901 Slow transit constipation: Secondary | ICD-10-CM | POA: Diagnosis not present

## 2024-11-03 DIAGNOSIS — F0280 Dementia in other diseases classified elsewhere without behavioral disturbance: Secondary | ICD-10-CM | POA: Diagnosis not present

## 2024-11-03 DIAGNOSIS — G301 Alzheimer's disease with late onset: Secondary | ICD-10-CM | POA: Diagnosis not present

## 2024-11-06 DIAGNOSIS — S32010D Wedge compression fracture of first lumbar vertebra, subsequent encounter for fracture with routine healing: Secondary | ICD-10-CM | POA: Diagnosis not present

## 2024-11-06 DIAGNOSIS — F02B0 Dementia in other diseases classified elsewhere, moderate, without behavioral disturbance, psychotic disturbance, mood disturbance, and anxiety: Secondary | ICD-10-CM | POA: Diagnosis not present

## 2024-11-06 DIAGNOSIS — R531 Weakness: Secondary | ICD-10-CM | POA: Diagnosis not present

## 2024-11-07 DIAGNOSIS — R54 Age-related physical debility: Secondary | ICD-10-CM | POA: Diagnosis not present

## 2024-11-07 DIAGNOSIS — R2681 Unsteadiness on feet: Secondary | ICD-10-CM | POA: Diagnosis not present

## 2024-11-07 DIAGNOSIS — F0280 Dementia in other diseases classified elsewhere without behavioral disturbance: Secondary | ICD-10-CM | POA: Diagnosis not present

## 2024-11-08 DIAGNOSIS — R531 Weakness: Secondary | ICD-10-CM | POA: Diagnosis not present

## 2024-11-08 DIAGNOSIS — F02B0 Dementia in other diseases classified elsewhere, moderate, without behavioral disturbance, psychotic disturbance, mood disturbance, and anxiety: Secondary | ICD-10-CM | POA: Diagnosis not present

## 2024-11-08 DIAGNOSIS — S32010D Wedge compression fracture of first lumbar vertebra, subsequent encounter for fracture with routine healing: Secondary | ICD-10-CM | POA: Diagnosis not present

## 2024-11-09 DIAGNOSIS — R54 Age-related physical debility: Secondary | ICD-10-CM | POA: Diagnosis not present

## 2024-11-09 DIAGNOSIS — S32010D Wedge compression fracture of first lumbar vertebra, subsequent encounter for fracture with routine healing: Secondary | ICD-10-CM | POA: Diagnosis not present

## 2024-11-09 DIAGNOSIS — F0280 Dementia in other diseases classified elsewhere without behavioral disturbance: Secondary | ICD-10-CM | POA: Diagnosis not present

## 2024-11-09 DIAGNOSIS — R2681 Unsteadiness on feet: Secondary | ICD-10-CM | POA: Diagnosis not present

## 2024-11-09 DIAGNOSIS — R531 Weakness: Secondary | ICD-10-CM | POA: Diagnosis not present

## 2024-11-09 DIAGNOSIS — F02B0 Dementia in other diseases classified elsewhere, moderate, without behavioral disturbance, psychotic disturbance, mood disturbance, and anxiety: Secondary | ICD-10-CM | POA: Diagnosis not present

## 2024-11-14 DIAGNOSIS — R54 Age-related physical debility: Secondary | ICD-10-CM | POA: Diagnosis not present

## 2024-11-14 DIAGNOSIS — R2681 Unsteadiness on feet: Secondary | ICD-10-CM | POA: Diagnosis not present

## 2024-11-14 DIAGNOSIS — S32010D Wedge compression fracture of first lumbar vertebra, subsequent encounter for fracture with routine healing: Secondary | ICD-10-CM | POA: Diagnosis not present

## 2024-11-14 DIAGNOSIS — F02B0 Dementia in other diseases classified elsewhere, moderate, without behavioral disturbance, psychotic disturbance, mood disturbance, and anxiety: Secondary | ICD-10-CM | POA: Diagnosis not present

## 2024-11-14 DIAGNOSIS — F0280 Dementia in other diseases classified elsewhere without behavioral disturbance: Secondary | ICD-10-CM | POA: Diagnosis not present

## 2024-11-16 DIAGNOSIS — R54 Age-related physical debility: Secondary | ICD-10-CM | POA: Diagnosis not present

## 2024-11-16 DIAGNOSIS — N390 Urinary tract infection, site not specified: Secondary | ICD-10-CM | POA: Diagnosis not present

## 2024-11-16 DIAGNOSIS — F0280 Dementia in other diseases classified elsewhere without behavioral disturbance: Secondary | ICD-10-CM | POA: Diagnosis not present

## 2024-11-16 DIAGNOSIS — R2681 Unsteadiness on feet: Secondary | ICD-10-CM | POA: Diagnosis not present

## 2024-11-16 DIAGNOSIS — R531 Weakness: Secondary | ICD-10-CM | POA: Diagnosis not present

## 2024-11-16 DIAGNOSIS — S32010D Wedge compression fracture of first lumbar vertebra, subsequent encounter for fracture with routine healing: Secondary | ICD-10-CM | POA: Diagnosis not present

## 2024-11-16 DIAGNOSIS — F02B0 Dementia in other diseases classified elsewhere, moderate, without behavioral disturbance, psychotic disturbance, mood disturbance, and anxiety: Secondary | ICD-10-CM | POA: Diagnosis not present

## 2024-11-21 DIAGNOSIS — R531 Weakness: Secondary | ICD-10-CM | POA: Diagnosis not present

## 2024-11-21 DIAGNOSIS — F02B0 Dementia in other diseases classified elsewhere, moderate, without behavioral disturbance, psychotic disturbance, mood disturbance, and anxiety: Secondary | ICD-10-CM | POA: Diagnosis not present

## 2024-11-21 DIAGNOSIS — R54 Age-related physical debility: Secondary | ICD-10-CM | POA: Diagnosis not present

## 2024-11-21 DIAGNOSIS — F0280 Dementia in other diseases classified elsewhere without behavioral disturbance: Secondary | ICD-10-CM | POA: Diagnosis not present

## 2024-11-21 DIAGNOSIS — S32010D Wedge compression fracture of first lumbar vertebra, subsequent encounter for fracture with routine healing: Secondary | ICD-10-CM | POA: Diagnosis not present

## 2024-11-22 DIAGNOSIS — R531 Weakness: Secondary | ICD-10-CM | POA: Diagnosis not present

## 2024-11-22 DIAGNOSIS — F02B0 Dementia in other diseases classified elsewhere, moderate, without behavioral disturbance, psychotic disturbance, mood disturbance, and anxiety: Secondary | ICD-10-CM | POA: Diagnosis not present

## 2024-11-22 DIAGNOSIS — R54 Age-related physical debility: Secondary | ICD-10-CM | POA: Diagnosis not present

## 2024-11-22 DIAGNOSIS — S32010D Wedge compression fracture of first lumbar vertebra, subsequent encounter for fracture with routine healing: Secondary | ICD-10-CM | POA: Diagnosis not present

## 2024-11-22 DIAGNOSIS — F0280 Dementia in other diseases classified elsewhere without behavioral disturbance: Secondary | ICD-10-CM | POA: Diagnosis not present

## 2024-11-22 DIAGNOSIS — R2681 Unsteadiness on feet: Secondary | ICD-10-CM | POA: Diagnosis not present

## 2024-11-27 ENCOUNTER — Emergency Department (HOSPITAL_COMMUNITY)

## 2024-11-27 ENCOUNTER — Other Ambulatory Visit: Payer: Self-pay

## 2024-11-27 ENCOUNTER — Encounter (HOSPITAL_COMMUNITY): Payer: Self-pay

## 2024-11-27 ENCOUNTER — Emergency Department (HOSPITAL_COMMUNITY)
Admission: EM | Admit: 2024-11-27 | Discharge: 2024-11-27 | Disposition: A | Discharge status: Home / Self Care | Source: Skilled Nursing Facility | Attending: Emergency Medicine | Admitting: Emergency Medicine

## 2024-11-27 DIAGNOSIS — S0990XA Unspecified injury of head, initial encounter: Secondary | ICD-10-CM | POA: Insufficient documentation

## 2024-11-27 DIAGNOSIS — S0180XA Unspecified open wound of other part of head, initial encounter: Secondary | ICD-10-CM | POA: Diagnosis not present

## 2024-11-27 DIAGNOSIS — S0001XA Abrasion of scalp, initial encounter: Secondary | ICD-10-CM | POA: Diagnosis not present

## 2024-11-27 DIAGNOSIS — Z7401 Bed confinement status: Secondary | ICD-10-CM | POA: Diagnosis not present

## 2024-11-27 DIAGNOSIS — S0003XA Contusion of scalp, initial encounter: Secondary | ICD-10-CM | POA: Diagnosis not present

## 2024-11-27 DIAGNOSIS — W19XXXA Unspecified fall, initial encounter: Secondary | ICD-10-CM | POA: Insufficient documentation

## 2024-11-27 DIAGNOSIS — Z7901 Long term (current) use of anticoagulants: Secondary | ICD-10-CM | POA: Diagnosis not present

## 2024-11-27 DIAGNOSIS — I4891 Unspecified atrial fibrillation: Secondary | ICD-10-CM | POA: Insufficient documentation

## 2024-11-27 LAB — CBC WITH DIFFERENTIAL/PLATELET
Abs Immature Granulocytes: 0.06 K/uL (ref 0.00–0.07)
Basophils Absolute: 0 K/uL (ref 0.0–0.1)
Basophils Relative: 1 %
Eosinophils Absolute: 0.1 K/uL (ref 0.0–0.5)
Eosinophils Relative: 2 %
HCT: 42 % (ref 36.0–46.0)
Hemoglobin: 13.7 g/dL (ref 12.0–15.0)
Immature Granulocytes: 1 %
Lymphocytes Relative: 21 %
Lymphs Abs: 1.4 K/uL (ref 0.7–4.0)
MCH: 31.4 pg (ref 26.0–34.0)
MCHC: 32.6 g/dL (ref 30.0–36.0)
MCV: 96.3 fL (ref 80.0–100.0)
Monocytes Absolute: 0.6 K/uL (ref 0.1–1.0)
Monocytes Relative: 9 %
Neutro Abs: 4.4 K/uL (ref 1.7–7.7)
Neutrophils Relative %: 66 %
Platelets: 239 K/uL (ref 150–400)
RBC: 4.36 MIL/uL (ref 3.87–5.11)
RDW: 13.9 % (ref 11.5–15.5)
WBC: 6.6 K/uL (ref 4.0–10.5)
nRBC: 0 % (ref 0.0–0.2)

## 2024-11-27 LAB — I-STAT CHEM 8, ED
BUN: 21 mg/dL (ref 8–23)
Calcium, Ion: 1.03 mmol/L — ABNORMAL LOW (ref 1.15–1.40)
Chloride: 103 mmol/L (ref 98–111)
Creatinine, Ser: 0.8 mg/dL (ref 0.44–1.00)
Glucose, Bld: 100 mg/dL — ABNORMAL HIGH (ref 70–99)
HCT: 42 % (ref 36.0–46.0)
Hemoglobin: 14.3 g/dL (ref 12.0–15.0)
Potassium: 4.5 mmol/L (ref 3.5–5.1)
Sodium: 138 mmol/L (ref 135–145)
TCO2: 24 mmol/L (ref 22–32)

## 2024-11-27 LAB — BASIC METABOLIC PANEL WITH GFR
Anion gap: 9 (ref 5–15)
BUN: 19 mg/dL (ref 8–23)
CO2: 27 mmol/L (ref 22–32)
Calcium: 8.9 mg/dL (ref 8.9–10.3)
Chloride: 101 mmol/L (ref 98–111)
Creatinine, Ser: 0.71 mg/dL (ref 0.44–1.00)
GFR, Estimated: >60 mL/min (ref >60)
Glucose, Bld: 103 mg/dL — ABNORMAL HIGH (ref 70–99)
Potassium: 4.5 mmol/L (ref 3.5–5.1)
Sodium: 137 mmol/L (ref 135–145)

## 2024-11-27 LAB — DIGOXIN LEVEL: Digoxin Level: 0.7 ng/mL — ABNORMAL LOW (ref 0.8–2.0)

## 2024-11-27 LAB — TYPE AND SCREEN
ABO/RH(D): O POS
Antibody Screen: NEGATIVE

## 2024-11-27 MED ORDER — TETANUS-DIPHTH-ACELL PERTUSSIS 5-2-15.5 LF-MCG/0.5 IM SUSP
0.5000 mL | Freq: Once | INTRAMUSCULAR | Status: AC
  Administered 2024-11-27: 0.5 mL via INTRAMUSCULAR
  Filled 2024-11-27: qty 0.5

## 2024-11-27 MED ORDER — ACETAMINOPHEN 500 MG PO TABS
1000.0000 mg | ORAL_TABLET | Freq: Once | ORAL | Status: AC
  Administered 2024-11-27: 1000 mg via ORAL
  Filled 2024-11-27: qty 2

## 2024-11-27 NOTE — ED Notes (Signed)
 Resting, NAD, calm, interactive, agreeable. Forehead dressing CDI. Daughter sitting at [facility] in h/w 9. Pending PTAR. No changes.

## 2024-11-27 NOTE — ED Triage Notes (Signed)
 Pt to ED via EMS with c/o unwitnessed fall this am. EMS reports pt got out of bed to use the restroom and fell. +blood thinners. Pt A&Ox1, hx dementia. Pt arrives in c-collar. Hematoma to left posterior scalp. No active bleeding noted. Pt maintaining own airway and secretions. Pt breathing even and unlabored.  148/86 manual BP

## 2024-11-27 NOTE — ED Provider Notes (Signed)
 [place] EMERGENCY DEPARTMENT AT [facility] Provider Note   CSN: [id number] Arrival date & time: 11/27/24  9191     Patient presents with: Kelly Dougherty is a 83 y.o. female.   83 year old female with prior medical history as detailed below presents for evaluation.  Patient had a fall this morning.  She is on Eliquis  for treatment of A-fib.  She apparently got up to use the restroom and fell.  She did strike her head.  She has a contusion on the left posterior scalp.  There is some minimal bleeding reported by EMS.  Collar placed by EMS.  No other obvious injury reported per EMS.  Patient appears to be at baseline mental status.  Patient complains of a headache.  Patient denies other injury.  The history is provided by the patient and medical records.       Prior to Admission medications   Medication Sig Start Date End Date Taking? Authorizing Provider  acetaminophen  (TYLENOL ) 500 MG tablet Take 1,000 mg by mouth 3 (three) times daily.    [provider]  calcitonin, salmon, (MIACALCIN/FORTICAL) 200 UNIT/ACT nasal spray Place 1 spray into alternate nostrils daily. Patient not taking: Reported on 09/18/2024 06/22/24   Duanne Butler DASEN, MD  calcium carbonate (CALCIUM 600) 600 MG TABS tablet Take 600 mg by mouth 2 (two) times daily with a meal.    [provider]  cyanocobalamin  (VITAMIN B12) 1000 MCG tablet TAKE 1 TAB BY MOUTH ONCE DAILY 08/22/24   Duanne Butler DASEN, MD  digoxin  (LANOXIN ) 0.125 MG tablet TAKE 1 TABLET BY MOUTH ONCE DAILY MONDAY  THRU  SATURDAY.  NONE  ON  SUNDAY 01/24/24   Waddell Danelle ORN, MD  dorzolamide (TRUSOPT) 2 % ophthalmic solution INSTILL 1 DROP INTO EACH EYE ONCE DAILY 08/22/24   Duanne Butler DASEN, MD  ELIQUIS  2.5 MG TABS tablet TAKE 1 TAB BY MOUTH TWICE DAILY 08/22/24   Duanne Butler DASEN, MD  escitalopram  (LEXAPRO ) 10 MG tablet Take 1 tablet (10 mg total) by mouth daily. 05/09/24   Duanne Butler DASEN, MD  metoprolol  succinate  (TOPROL -XL) 50 MG 24 hr tablet Take 1 tablet (50 mg total) by mouth daily. Take with or immediately following a meal. Please keep scheduled appointment for future refills. Thank you. 12/21/23   Waddell Danelle ORN, MD  Multiple Vitamins-Minerals (PRESERVISION AREDS PO) Take 1 capsule by mouth in the morning and at bedtime.    [provider]  Netarsudil Dimesylate 0.02 % SOLN Place 1 drop into the left eye every evening. Rhopressa 07/31/20   [provider]    Allergies: Patient has no known allergies.    Review of Systems  All other systems reviewed and are negative.   Updated Vital Signs BP (!) 148/86   Pulse 91   Temp (!) 97.4 F (36.3 C) (Axillary)   Resp (!) 21   Ht 5' 5 (1.651 m)   Wt 52 kg   SpO2 99%   BMI 19.08 kg/m   Physical Exam Vitals and nursing note reviewed.  Constitutional:      General: She is not in acute distress.    Appearance: Normal appearance. She is well-developed.  HENT:     Head: Normocephalic.     Comments: Contusion to the left posterior occiput.  Abrasion noted over the contusion.  No active bleeding. Eyes:     Conjunctiva/sclera: Conjunctivae normal.     Pupils: Pupils are equal, round, and reactive to  light.  Neck:     Comments: Cervical collar in place Cardiovascular:     Rate and Rhythm: Normal rate and regular rhythm.     Heart sounds: Normal heart sounds.  Pulmonary:     Effort: Pulmonary effort is normal. No respiratory distress.     Breath sounds: Normal breath sounds.  Abdominal:     General: There is no distension.     Palpations: Abdomen is soft.     Tenderness: There is no abdominal tenderness.  Musculoskeletal:        General: No deformity. Normal range of motion.  Skin:    General: Skin is warm and dry.  Neurological:     General: No focal deficit present.     Mental Status: She is alert.     Comments: Oriented to person.  This appears to be close to baseline mental status.  No focal deficit appreciated      (all labs ordered are listed, but only abnormal results are displayed) Labs Reviewed  I-STAT CHEM 8, ED - Abnormal; Notable for the following components:      Result Value   Glucose, Bld 100 (*)    Calcium, Ion 1.03 (*)    All other components within normal limits  CBC WITH DIFFERENTIAL/PLATELET  BASIC METABOLIC PANEL WITH GFR  DIGOXIN  LEVEL  TYPE AND SCREEN    EKG: None  Radiology: No results found.   Procedures   Medications Ordered in the ED  Tdap (ADACEL) injection 0.5 mL (has no administration in time range)                                    Medical Decision Making Patient presents after a fall from standing.  She did strike her head.  She is on anticoagulation with history of A-fib.  Exam demonstrated contusion to the posterior left scalp.  CT imaging and other imaging obtained is fortunately without significant traumatic abnormality.  Obtained labs are also without acute abnormality noted.  Patient's daughter is at bedside.  The daughter and the patient understand need for close outpatient follow-up.  Strict return precautions given and understood.  Amount and/or Complexity of Data Reviewed Labs: ordered. Radiology: ordered.  Risk OTC drugs. Prescription drug management.        Final diagnoses:  Injury of head, initial encounter    ED Discharge Orders     None          Laurice Maude BROCKS, MD 11/27/24 1010

## 2024-11-27 NOTE — ED Notes (Signed)
 Called PT[facility] ETA 45 MINUTE

## 2024-11-27 NOTE — ED Notes (Signed)
 XR at bedside

## 2024-11-27 NOTE — Discharge Instructions (Signed)
 Return for any problem.  ?

## 2024-11-27 NOTE — ED Notes (Signed)
 Moved to h/w. Discussed with daughter at [facility]. Pt resting. Pending [facility] arrival for transport back to [place].

## 2024-11-27 NOTE — ED Notes (Signed)
 PTAR at [facility]. Daughter at [facility].

## 2024-11-27 NOTE — Progress Notes (Signed)
 Orthopedic Tech Progress Note Patient Details:  [name] [birth year] [id number]  Level 2 trauma   Patient ID: Kelly Dougherty, female   DOB: 1940/12/29, 83 y.o.   MRN: [id number]  Delanna LITTIE Pac 11/27/2024, 8:30 AM

## 2024-11-28 DIAGNOSIS — F02B0 Dementia in other diseases classified elsewhere, moderate, without behavioral disturbance, psychotic disturbance, mood disturbance, and anxiety: Secondary | ICD-10-CM | POA: Diagnosis not present

## 2024-11-28 DIAGNOSIS — S32010D Wedge compression fracture of first lumbar vertebra, subsequent encounter for fracture with routine healing: Secondary | ICD-10-CM | POA: Diagnosis not present

## 2024-11-28 DIAGNOSIS — R54 Age-related physical debility: Secondary | ICD-10-CM | POA: Diagnosis not present

## 2024-11-28 DIAGNOSIS — F0280 Dementia in other diseases classified elsewhere without behavioral disturbance: Secondary | ICD-10-CM | POA: Diagnosis not present

## 2024-11-30 DIAGNOSIS — G301 Alzheimer's disease with late onset: Secondary | ICD-10-CM | POA: Diagnosis not present

## 2024-11-30 DIAGNOSIS — F0280 Dementia in other diseases classified elsewhere without behavioral disturbance: Secondary | ICD-10-CM | POA: Diagnosis not present

## 2024-11-30 DIAGNOSIS — R531 Weakness: Secondary | ICD-10-CM | POA: Diagnosis not present

## 2024-11-30 DIAGNOSIS — Z7901 Long term (current) use of anticoagulants: Secondary | ICD-10-CM | POA: Diagnosis not present

## 2024-11-30 DIAGNOSIS — F02B0 Dementia in other diseases classified elsewhere, moderate, without behavioral disturbance, psychotic disturbance, mood disturbance, and anxiety: Secondary | ICD-10-CM | POA: Diagnosis not present

## 2024-11-30 DIAGNOSIS — I4891 Unspecified atrial fibrillation: Secondary | ICD-10-CM | POA: Diagnosis not present

## 2024-11-30 DIAGNOSIS — I10 Essential (primary) hypertension: Secondary | ICD-10-CM | POA: Diagnosis not present

## 2024-11-30 DIAGNOSIS — S32010D Wedge compression fracture of first lumbar vertebra, subsequent encounter for fracture with routine healing: Secondary | ICD-10-CM | POA: Diagnosis not present

## 2024-12-01 DIAGNOSIS — F0280 Dementia in other diseases classified elsewhere without behavioral disturbance: Secondary | ICD-10-CM | POA: Diagnosis not present

## 2024-12-01 DIAGNOSIS — R54 Age-related physical debility: Secondary | ICD-10-CM | POA: Diagnosis not present

## 2024-12-05 DIAGNOSIS — F02B0 Dementia in other diseases classified elsewhere, moderate, without behavioral disturbance, psychotic disturbance, mood disturbance, and anxiety: Secondary | ICD-10-CM | POA: Diagnosis not present

## 2024-12-05 DIAGNOSIS — S32010D Wedge compression fracture of first lumbar vertebra, subsequent encounter for fracture with routine healing: Secondary | ICD-10-CM | POA: Diagnosis not present

## 2024-12-29 ENCOUNTER — Ambulatory Visit
Attending: Student in an Organized Health Care Education/Training Program | Admitting: Student in an Organized Health Care Education/Training Program

## 2024-12-29 ENCOUNTER — Encounter: Payer: Self-pay | Admitting: Student in an Organized Health Care Education/Training Program

## 2024-12-29 VITALS — BP 94/62 | HR 95 | Ht 66.0 in | Wt 112.6 lb

## 2024-12-29 DIAGNOSIS — I482 Chronic atrial fibrillation, unspecified: Secondary | ICD-10-CM

## 2024-12-29 NOTE — Progress Notes (Signed)
 " Cardiology Office Note   Date: 12/29/24 ID:  Kelly Dougherty, DOB [birth year], MRN [id number] PCP: Duanne Butler DASEN, MD  [facility] Providers Cardiologist:  None Electrophysiologist:  Donnice DELENA Primus, MD   History of Present Illness Kelly Dougherty is a 84 y.o. 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  She was last seen in clinic by Dr. Waddell on 12/17/2023 and at that time her atrial arrhythmias were rate controlled but she was continued on Toprol -XL and digoxin .  No changes were made at that visit.  Discussed the use of AI scribe software for clinical note transcription with the patient, who gave verbal consent to proceed.  History of Present Illness Kelly Dougherty is an 84 year old female with atrial fibrillation who presents for follow-up regarding her heart rhythm management. She is accompanied by her daughter, Veva.  She resides in a memory care facility since July and has experienced several hospitalizations, most recently in November, primarily due to falls. During these hospitalizations, heart monitors frequently indicate atrial fibrillation. No symptoms of rapid heartbeats or unusual heart rhythms are reported outside of these hospital settings.  She uses a rollator for mobility due to occasional unsteadiness and receives physical and occupational therapy support. Her daughter notes that she sometimes gets 'a little wobbly on her feet.'  She is currently on metoprolol  and Eliquis  for her atrial fibrillation. There was a recent issue with her medication billing, where she was switched to a more expensive capsule form of metoprolol , which was not necessary as she can swallow pills. Her daughter has communicated with the facility to ensure the medication is switched back to the more affordable form.  Her daughter has been proactive in managing her care, communicating with the facility's director  and the visiting nurse to ensure proper medication management and to avoid unnecessary expenses.  ROS: none  Studies Reviewed  ECG review 12/29/24: AF/VR 57m QRS 68, QT/c 338/406 09/18/24: AF/RVR 110, QRS 100, QT/c 323/419 07/11/24: AF/VR 93, QRS 86, QT/c 328/408 11/19/23: AF/VR 81, QRS 65, QT/c 432/446  Risk Assessment/Calculations  CHA2DS2-VASc Score = 4  This indicates a 4.8% annual risk of stroke. The patient's score is based upon: CHF History: 0 HTN History: 1 Diabetes History: 0 Stroke History: 0 Vascular Disease History: 0 Age Score: 2 Gender Score: 1   Physical Exam VS:  BP 94/62 (BP Location: Left Arm, Patient Position: Sitting, Cuff Size: Small)   Pulse 95   Ht 5' 6 (1.676 m)   Wt 112 lb 9.6 oz (51.1 kg)   SpO2 98%   BMI 18.17 kg/m   Wt Readings from Last 3 Encounters:  12/29/24 112 lb 9.6 oz (51.1 kg)  11/27/24 114 lb 10.2 oz (52 kg)  09/18/24 115 lb (52.2 kg)    GEN: Well nourished, well developed in no acute distress CARDIAC: irregular rhyth, rate controlled, no murmurs, rubs, gallops RESPIRATORY:  Clear to auscultation without rales, wheezing or rhonchi  EXTREMITIES:  No edema; No deformity   ASSESSMENT AND PLAN Kelly Dougherty is a 84 y.o. 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 Assessment & Plan Chronic atrial fibrillation Controlled ventricular response with elevated atrial rate. Asymptomatic and tolerating condition. Cardioversion not pursued due to age and stable management. Focus on rate control and thromboembolism prevention. - Continue current medications for rate control. - Ensure continuation of Eliquis  for anticoagulation. -  Changed brand name toprol  XL to generic toprol  XL, she doesn't have to have capsules per family report - continue digoxin  for rate control  - Communicated medication change to facility staff and ensured billing adjustments.  Dispo: RTC 1  year  A total of 20 minutes was spent preparing for the patient, reviewing history, performing exam, document encounter, coordinating care and counseling the patient. 10 minutes was spent with direct patient care.   Signed, Donnice DELENA Primus, MD  "

## 2024-12-29 NOTE — Patient Instructions (Signed)

## 2024-12-29 NOTE — Progress Notes (Deleted)
" °  Cardiology Office Note   Date:  12/29/2024  ID:  TIKESHA MORT, DOB 03/24/1941, MRN [id number] PCP: Duanne Butler DASEN, MD  [facility] Providers Cardiologist:  None Electrophysiologist:  Danelle Birmingham, MD { Click to update primary MD,subspecialty MD or APP then REFRESH:1}    History of Present Illness GEORGIA DELSIGNORE is a 84 y.o. female ***  ROS: ***  Studies Reviewed      *** Risk Assessment/Calculations {Does this patient have ATRIAL FIBRILLATION?:[phone]}         Physical Exam VS:  BP 94/62 (BP Location: Left Arm, Patient Position: Sitting, Cuff Size: Small)   Pulse 95   Ht 5' 6 (1.676 m)   Wt 112 lb 9.6 oz (51.1 kg)   SpO2 98%   BMI 18.17 kg/m        Wt Readings from Last 3 Encounters:  12/29/24 112 lb 9.6 oz (51.1 kg)  11/27/24 114 lb 10.2 oz (52 kg)  09/18/24 115 lb (52.2 kg)    GEN: Well nourished, well developed in no acute distress NECK: No JVD; No carotid bruits CARDIAC: ***RRR, no murmurs, rubs, gallops RESPIRATORY:  Clear to auscultation without rales, wheezing or rhonchi  ABDOMEN: Soft, non-tender, non-distended EXTREMITIES:  No edema; No deformity   ASSESSMENT AND PLAN ***    {Are you ordering a CV Procedure (e.g. stress test, cath, DCCV, TEE, etc)?   Press F2        :[id number]}  Dispo: ***  Signed, Donnice DELENA Primus, MD  "
